# Patient Record
Sex: Male | Born: 1945
Health system: Southern US, Community
[De-identification: ages and names within clinical notes are randomized; demographics above are authoritative.]

## PROBLEM LIST (undated history)

## (undated) DIAGNOSIS — C801 Malignant (primary) neoplasm, unspecified: Secondary | ICD-10-CM

## (undated) DIAGNOSIS — F32A Depression, unspecified: Secondary | ICD-10-CM

## (undated) DIAGNOSIS — M1990 Unspecified osteoarthritis, unspecified site: Secondary | ICD-10-CM

## (undated) DIAGNOSIS — I509 Heart failure, unspecified: Secondary | ICD-10-CM

## (undated) DIAGNOSIS — E785 Hyperlipidemia, unspecified: Secondary | ICD-10-CM

## (undated) DIAGNOSIS — I10 Essential (primary) hypertension: Secondary | ICD-10-CM

## (undated) DIAGNOSIS — E119 Type 2 diabetes mellitus without complications: Secondary | ICD-10-CM

## (undated) DIAGNOSIS — F329 Major depressive disorder, single episode, unspecified: Secondary | ICD-10-CM

## (undated) HISTORY — DX: Unspecified osteoarthritis, unspecified site: M19.90

## (undated) HISTORY — DX: Essential (primary) hypertension: I10

## (undated) HISTORY — PX: APPENDECTOMY: SHX54

## (undated) HISTORY — DX: Malignant (primary) neoplasm, unspecified: C80.1

## (undated) HISTORY — DX: Type 2 diabetes mellitus without complications: E11.9

## (undated) HISTORY — PX: CHOLECYSTECTOMY: SHX55

## (undated) HISTORY — PX: FRACTURE SURGERY: SHX138

## (undated) HISTORY — DX: Hyperlipidemia, unspecified: E78.5

## (undated) HISTORY — DX: Depression, unspecified: F32.A

## (undated) HISTORY — DX: Major depressive disorder, single episode, unspecified: F32.9

## (undated) HISTORY — DX: Heart failure, unspecified: I50.9

---

## 1989-11-06 HISTORY — PX: GASTRIC BYPASS: SHX52

## 2013-11-11 DIAGNOSIS — I509 Heart failure, unspecified: Secondary | ICD-10-CM | POA: Diagnosis not present

## 2013-11-11 DIAGNOSIS — Z7901 Long term (current) use of anticoagulants: Secondary | ICD-10-CM | POA: Diagnosis not present

## 2013-11-11 DIAGNOSIS — I10 Essential (primary) hypertension: Secondary | ICD-10-CM | POA: Diagnosis not present

## 2013-11-11 DIAGNOSIS — E1129 Type 2 diabetes mellitus with other diabetic kidney complication: Secondary | ICD-10-CM | POA: Diagnosis not present

## 2013-11-11 DIAGNOSIS — E785 Hyperlipidemia, unspecified: Secondary | ICD-10-CM | POA: Diagnosis not present

## 2013-11-11 DIAGNOSIS — I4891 Unspecified atrial fibrillation: Secondary | ICD-10-CM | POA: Diagnosis not present

## 2013-11-11 DIAGNOSIS — N183 Chronic kidney disease, stage 3 unspecified: Secondary | ICD-10-CM | POA: Diagnosis not present

## 2013-11-12 DIAGNOSIS — I2699 Other pulmonary embolism without acute cor pulmonale: Secondary | ICD-10-CM | POA: Diagnosis not present

## 2013-11-12 DIAGNOSIS — G4733 Obstructive sleep apnea (adult) (pediatric): Secondary | ICD-10-CM | POA: Diagnosis not present

## 2013-11-12 DIAGNOSIS — I428 Other cardiomyopathies: Secondary | ICD-10-CM | POA: Diagnosis not present

## 2013-11-18 DIAGNOSIS — N189 Chronic kidney disease, unspecified: Secondary | ICD-10-CM | POA: Diagnosis not present

## 2013-11-18 DIAGNOSIS — E1129 Type 2 diabetes mellitus with other diabetic kidney complication: Secondary | ICD-10-CM | POA: Diagnosis not present

## 2013-11-18 DIAGNOSIS — I4891 Unspecified atrial fibrillation: Secondary | ICD-10-CM | POA: Diagnosis not present

## 2013-11-18 DIAGNOSIS — I10 Essential (primary) hypertension: Secondary | ICD-10-CM | POA: Diagnosis not present

## 2013-11-18 DIAGNOSIS — M1710 Unilateral primary osteoarthritis, unspecified knee: Secondary | ICD-10-CM | POA: Diagnosis not present

## 2013-11-18 DIAGNOSIS — Z7901 Long term (current) use of anticoagulants: Secondary | ICD-10-CM | POA: Diagnosis not present

## 2013-11-18 DIAGNOSIS — IMO0002 Reserved for concepts with insufficient information to code with codable children: Secondary | ICD-10-CM | POA: Diagnosis not present

## 2013-11-25 DIAGNOSIS — K029 Dental caries, unspecified: Secondary | ICD-10-CM | POA: Diagnosis not present

## 2013-11-25 DIAGNOSIS — J3489 Other specified disorders of nose and nasal sinuses: Secondary | ICD-10-CM | POA: Diagnosis not present

## 2013-11-25 DIAGNOSIS — M278 Other specified diseases of jaws: Secondary | ICD-10-CM | POA: Diagnosis not present

## 2013-11-25 DIAGNOSIS — K117 Disturbances of salivary secretion: Secondary | ICD-10-CM | POA: Diagnosis not present

## 2013-11-25 DIAGNOSIS — Z8589 Personal history of malignant neoplasm of other organs and systems: Secondary | ICD-10-CM | POA: Diagnosis not present

## 2013-11-26 DIAGNOSIS — R195 Other fecal abnormalities: Secondary | ICD-10-CM | POA: Diagnosis not present

## 2013-11-26 DIAGNOSIS — M129 Arthropathy, unspecified: Secondary | ICD-10-CM | POA: Diagnosis not present

## 2013-11-26 DIAGNOSIS — D126 Benign neoplasm of colon, unspecified: Secondary | ICD-10-CM | POA: Diagnosis not present

## 2013-11-26 DIAGNOSIS — K219 Gastro-esophageal reflux disease without esophagitis: Secondary | ICD-10-CM | POA: Diagnosis not present

## 2013-11-26 DIAGNOSIS — I10 Essential (primary) hypertension: Secondary | ICD-10-CM | POA: Diagnosis not present

## 2013-11-26 DIAGNOSIS — Z8711 Personal history of peptic ulcer disease: Secondary | ICD-10-CM | POA: Diagnosis not present

## 2013-11-26 DIAGNOSIS — K2900 Acute gastritis without bleeding: Secondary | ICD-10-CM | POA: Diagnosis not present

## 2013-11-26 DIAGNOSIS — I509 Heart failure, unspecified: Secondary | ICD-10-CM | POA: Diagnosis not present

## 2013-11-26 DIAGNOSIS — D129 Benign neoplasm of anus and anal canal: Secondary | ICD-10-CM | POA: Diagnosis not present

## 2013-11-26 DIAGNOSIS — Z79899 Other long term (current) drug therapy: Secondary | ICD-10-CM | POA: Diagnosis not present

## 2013-11-26 DIAGNOSIS — K2940 Chronic atrophic gastritis without bleeding: Secondary | ICD-10-CM | POA: Diagnosis not present

## 2013-11-26 DIAGNOSIS — Z86711 Personal history of pulmonary embolism: Secondary | ICD-10-CM | POA: Diagnosis not present

## 2013-11-26 DIAGNOSIS — E119 Type 2 diabetes mellitus without complications: Secondary | ICD-10-CM | POA: Diagnosis not present

## 2013-11-26 DIAGNOSIS — F3289 Other specified depressive episodes: Secondary | ICD-10-CM | POA: Diagnosis not present

## 2013-11-26 DIAGNOSIS — E785 Hyperlipidemia, unspecified: Secondary | ICD-10-CM | POA: Diagnosis not present

## 2013-11-26 DIAGNOSIS — Z7901 Long term (current) use of anticoagulants: Secondary | ICD-10-CM | POA: Diagnosis not present

## 2013-11-26 DIAGNOSIS — F329 Major depressive disorder, single episode, unspecified: Secondary | ICD-10-CM | POA: Diagnosis not present

## 2013-11-26 DIAGNOSIS — D649 Anemia, unspecified: Secondary | ICD-10-CM | POA: Diagnosis not present

## 2013-11-26 DIAGNOSIS — J449 Chronic obstructive pulmonary disease, unspecified: Secondary | ICD-10-CM | POA: Diagnosis not present

## 2013-11-26 DIAGNOSIS — F411 Generalized anxiety disorder: Secondary | ICD-10-CM | POA: Diagnosis not present

## 2013-11-26 DIAGNOSIS — G4733 Obstructive sleep apnea (adult) (pediatric): Secondary | ICD-10-CM | POA: Diagnosis not present

## 2013-11-26 DIAGNOSIS — K254 Chronic or unspecified gastric ulcer with hemorrhage: Secondary | ICD-10-CM | POA: Diagnosis not present

## 2013-11-26 DIAGNOSIS — E079 Disorder of thyroid, unspecified: Secondary | ICD-10-CM | POA: Diagnosis not present

## 2013-11-26 DIAGNOSIS — D128 Benign neoplasm of rectum: Secondary | ICD-10-CM | POA: Diagnosis not present

## 2013-11-26 DIAGNOSIS — Z9981 Dependence on supplemental oxygen: Secondary | ICD-10-CM | POA: Diagnosis not present

## 2013-11-26 DIAGNOSIS — K5730 Diverticulosis of large intestine without perforation or abscess without bleeding: Secondary | ICD-10-CM | POA: Diagnosis not present

## 2013-11-26 DIAGNOSIS — Z86010 Personal history of colonic polyps: Secondary | ICD-10-CM | POA: Diagnosis not present

## 2013-11-27 DIAGNOSIS — D128 Benign neoplasm of rectum: Secondary | ICD-10-CM | POA: Diagnosis not present

## 2013-11-27 DIAGNOSIS — D129 Benign neoplasm of anus and anal canal: Secondary | ICD-10-CM | POA: Diagnosis not present

## 2013-11-27 DIAGNOSIS — K2940 Chronic atrophic gastritis without bleeding: Secondary | ICD-10-CM | POA: Diagnosis not present

## 2013-11-27 DIAGNOSIS — D126 Benign neoplasm of colon, unspecified: Secondary | ICD-10-CM | POA: Diagnosis not present

## 2013-11-27 DIAGNOSIS — K2900 Acute gastritis without bleeding: Secondary | ICD-10-CM | POA: Diagnosis not present

## 2013-11-27 DIAGNOSIS — K5730 Diverticulosis of large intestine without perforation or abscess without bleeding: Secondary | ICD-10-CM | POA: Diagnosis not present

## 2013-11-27 DIAGNOSIS — K254 Chronic or unspecified gastric ulcer with hemorrhage: Secondary | ICD-10-CM | POA: Diagnosis not present

## 2013-11-27 DIAGNOSIS — R195 Other fecal abnormalities: Secondary | ICD-10-CM | POA: Diagnosis not present

## 2013-11-28 DIAGNOSIS — K2940 Chronic atrophic gastritis without bleeding: Secondary | ICD-10-CM | POA: Diagnosis not present

## 2013-11-28 DIAGNOSIS — D129 Benign neoplasm of anus and anal canal: Secondary | ICD-10-CM | POA: Diagnosis not present

## 2013-11-28 DIAGNOSIS — K5730 Diverticulosis of large intestine without perforation or abscess without bleeding: Secondary | ICD-10-CM | POA: Diagnosis not present

## 2013-11-28 DIAGNOSIS — D126 Benign neoplasm of colon, unspecified: Secondary | ICD-10-CM | POA: Diagnosis not present

## 2013-11-28 DIAGNOSIS — I509 Heart failure, unspecified: Secondary | ICD-10-CM | POA: Diagnosis not present

## 2013-11-28 DIAGNOSIS — K2900 Acute gastritis without bleeding: Secondary | ICD-10-CM | POA: Diagnosis not present

## 2013-11-28 DIAGNOSIS — K254 Chronic or unspecified gastric ulcer with hemorrhage: Secondary | ICD-10-CM | POA: Diagnosis not present

## 2013-11-28 DIAGNOSIS — K250 Acute gastric ulcer with hemorrhage: Secondary | ICD-10-CM | POA: Diagnosis not present

## 2013-11-28 DIAGNOSIS — D128 Benign neoplasm of rectum: Secondary | ICD-10-CM | POA: Diagnosis not present

## 2013-12-18 DIAGNOSIS — N058 Unspecified nephritic syndrome with other morphologic changes: Secondary | ICD-10-CM | POA: Diagnosis not present

## 2013-12-18 DIAGNOSIS — I10 Essential (primary) hypertension: Secondary | ICD-10-CM | POA: Diagnosis not present

## 2013-12-18 DIAGNOSIS — J309 Allergic rhinitis, unspecified: Secondary | ICD-10-CM | POA: Diagnosis not present

## 2013-12-18 DIAGNOSIS — Z7901 Long term (current) use of anticoagulants: Secondary | ICD-10-CM | POA: Diagnosis not present

## 2013-12-18 DIAGNOSIS — E1129 Type 2 diabetes mellitus with other diabetic kidney complication: Secondary | ICD-10-CM | POA: Diagnosis not present

## 2013-12-18 DIAGNOSIS — I4891 Unspecified atrial fibrillation: Secondary | ICD-10-CM | POA: Diagnosis not present

## 2013-12-20 DIAGNOSIS — M129 Arthropathy, unspecified: Secondary | ICD-10-CM | POA: Diagnosis not present

## 2013-12-20 DIAGNOSIS — E079 Disorder of thyroid, unspecified: Secondary | ICD-10-CM | POA: Diagnosis not present

## 2013-12-20 DIAGNOSIS — R0602 Shortness of breath: Secondary | ICD-10-CM | POA: Diagnosis not present

## 2013-12-20 DIAGNOSIS — I4510 Unspecified right bundle-branch block: Secondary | ICD-10-CM | POA: Diagnosis not present

## 2013-12-20 DIAGNOSIS — Z87891 Personal history of nicotine dependence: Secondary | ICD-10-CM | POA: Diagnosis not present

## 2013-12-20 DIAGNOSIS — I509 Heart failure, unspecified: Secondary | ICD-10-CM | POA: Diagnosis not present

## 2013-12-20 DIAGNOSIS — R0989 Other specified symptoms and signs involving the circulatory and respiratory systems: Secondary | ICD-10-CM | POA: Diagnosis not present

## 2013-12-20 DIAGNOSIS — Z888 Allergy status to other drugs, medicaments and biological substances status: Secondary | ICD-10-CM | POA: Diagnosis not present

## 2013-12-20 DIAGNOSIS — I10 Essential (primary) hypertension: Secondary | ICD-10-CM | POA: Diagnosis not present

## 2013-12-20 DIAGNOSIS — Z79899 Other long term (current) drug therapy: Secondary | ICD-10-CM | POA: Diagnosis not present

## 2013-12-20 DIAGNOSIS — R112 Nausea with vomiting, unspecified: Secondary | ICD-10-CM | POA: Diagnosis not present

## 2013-12-20 DIAGNOSIS — I517 Cardiomegaly: Secondary | ICD-10-CM | POA: Diagnosis not present

## 2013-12-20 DIAGNOSIS — K219 Gastro-esophageal reflux disease without esophagitis: Secondary | ICD-10-CM | POA: Diagnosis not present

## 2013-12-20 DIAGNOSIS — Z7901 Long term (current) use of anticoagulants: Secondary | ICD-10-CM | POA: Diagnosis not present

## 2013-12-20 DIAGNOSIS — E785 Hyperlipidemia, unspecified: Secondary | ICD-10-CM | POA: Diagnosis not present

## 2013-12-20 DIAGNOSIS — R079 Chest pain, unspecified: Secondary | ICD-10-CM | POA: Diagnosis not present

## 2013-12-20 DIAGNOSIS — F411 Generalized anxiety disorder: Secondary | ICD-10-CM | POA: Diagnosis not present

## 2013-12-20 DIAGNOSIS — Z7982 Long term (current) use of aspirin: Secondary | ICD-10-CM | POA: Diagnosis not present

## 2013-12-20 DIAGNOSIS — R091 Pleurisy: Secondary | ICD-10-CM | POA: Diagnosis not present

## 2013-12-20 DIAGNOSIS — R0609 Other forms of dyspnea: Secondary | ICD-10-CM | POA: Diagnosis not present

## 2014-01-02 DIAGNOSIS — C44519 Basal cell carcinoma of skin of other part of trunk: Secondary | ICD-10-CM | POA: Diagnosis not present

## 2014-01-02 DIAGNOSIS — C44320 Squamous cell carcinoma of skin of unspecified parts of face: Secondary | ICD-10-CM | POA: Diagnosis not present

## 2014-01-02 DIAGNOSIS — Z872 Personal history of diseases of the skin and subcutaneous tissue: Secondary | ICD-10-CM | POA: Diagnosis not present

## 2014-01-02 DIAGNOSIS — D485 Neoplasm of uncertain behavior of skin: Secondary | ICD-10-CM | POA: Diagnosis not present

## 2014-01-05 DIAGNOSIS — C44519 Basal cell carcinoma of skin of other part of trunk: Secondary | ICD-10-CM | POA: Diagnosis not present

## 2014-01-05 DIAGNOSIS — M278 Other specified diseases of jaws: Secondary | ICD-10-CM | POA: Diagnosis not present

## 2014-01-05 DIAGNOSIS — C44320 Squamous cell carcinoma of skin of unspecified parts of face: Secondary | ICD-10-CM | POA: Diagnosis not present

## 2014-01-07 DIAGNOSIS — M278 Other specified diseases of jaws: Secondary | ICD-10-CM | POA: Diagnosis not present

## 2014-01-08 DIAGNOSIS — M278 Other specified diseases of jaws: Secondary | ICD-10-CM | POA: Diagnosis not present

## 2014-01-09 DIAGNOSIS — M278 Other specified diseases of jaws: Secondary | ICD-10-CM | POA: Diagnosis not present

## 2014-01-12 DIAGNOSIS — M278 Other specified diseases of jaws: Secondary | ICD-10-CM | POA: Diagnosis not present

## 2014-01-13 DIAGNOSIS — M278 Other specified diseases of jaws: Secondary | ICD-10-CM | POA: Diagnosis not present

## 2014-01-14 DIAGNOSIS — M278 Other specified diseases of jaws: Secondary | ICD-10-CM | POA: Diagnosis not present

## 2014-01-14 DIAGNOSIS — C44320 Squamous cell carcinoma of skin of unspecified parts of face: Secondary | ICD-10-CM | POA: Diagnosis not present

## 2014-01-14 DIAGNOSIS — C44519 Basal cell carcinoma of skin of other part of trunk: Secondary | ICD-10-CM | POA: Diagnosis not present

## 2014-01-15 DIAGNOSIS — I10 Essential (primary) hypertension: Secondary | ICD-10-CM | POA: Diagnosis not present

## 2014-01-15 DIAGNOSIS — I4891 Unspecified atrial fibrillation: Secondary | ICD-10-CM | POA: Diagnosis not present

## 2014-01-15 DIAGNOSIS — E1129 Type 2 diabetes mellitus with other diabetic kidney complication: Secondary | ICD-10-CM | POA: Diagnosis not present

## 2014-01-15 DIAGNOSIS — N058 Unspecified nephritic syndrome with other morphologic changes: Secondary | ICD-10-CM | POA: Diagnosis not present

## 2014-01-15 DIAGNOSIS — Z7901 Long term (current) use of anticoagulants: Secondary | ICD-10-CM | POA: Diagnosis not present

## 2014-01-15 DIAGNOSIS — M278 Other specified diseases of jaws: Secondary | ICD-10-CM | POA: Diagnosis not present

## 2014-01-16 DIAGNOSIS — M278 Other specified diseases of jaws: Secondary | ICD-10-CM | POA: Diagnosis not present

## 2014-01-19 DIAGNOSIS — M278 Other specified diseases of jaws: Secondary | ICD-10-CM | POA: Diagnosis not present

## 2014-01-20 DIAGNOSIS — I2699 Other pulmonary embolism without acute cor pulmonale: Secondary | ICD-10-CM | POA: Diagnosis not present

## 2014-01-20 DIAGNOSIS — I10 Essential (primary) hypertension: Secondary | ICD-10-CM | POA: Diagnosis not present

## 2014-01-20 DIAGNOSIS — M278 Other specified diseases of jaws: Secondary | ICD-10-CM | POA: Diagnosis not present

## 2014-01-20 DIAGNOSIS — I428 Other cardiomyopathies: Secondary | ICD-10-CM | POA: Diagnosis not present

## 2014-01-21 DIAGNOSIS — M278 Other specified diseases of jaws: Secondary | ICD-10-CM | POA: Diagnosis not present

## 2014-01-22 DIAGNOSIS — M278 Other specified diseases of jaws: Secondary | ICD-10-CM | POA: Diagnosis not present

## 2014-01-23 DIAGNOSIS — M278 Other specified diseases of jaws: Secondary | ICD-10-CM | POA: Diagnosis not present

## 2014-01-26 DIAGNOSIS — M278 Other specified diseases of jaws: Secondary | ICD-10-CM | POA: Diagnosis not present

## 2014-01-27 DIAGNOSIS — M278 Other specified diseases of jaws: Secondary | ICD-10-CM | POA: Diagnosis not present

## 2014-01-28 DIAGNOSIS — M278 Other specified diseases of jaws: Secondary | ICD-10-CM | POA: Diagnosis not present

## 2014-01-29 DIAGNOSIS — M278 Other specified diseases of jaws: Secondary | ICD-10-CM | POA: Diagnosis not present

## 2014-01-30 DIAGNOSIS — M278 Other specified diseases of jaws: Secondary | ICD-10-CM | POA: Diagnosis not present

## 2014-02-03 DIAGNOSIS — M278 Other specified diseases of jaws: Secondary | ICD-10-CM | POA: Diagnosis not present

## 2014-02-04 DIAGNOSIS — E119 Type 2 diabetes mellitus without complications: Secondary | ICD-10-CM | POA: Diagnosis not present

## 2014-02-04 DIAGNOSIS — D539 Nutritional anemia, unspecified: Secondary | ICD-10-CM | POA: Diagnosis not present

## 2014-02-04 DIAGNOSIS — D509 Iron deficiency anemia, unspecified: Secondary | ICD-10-CM | POA: Diagnosis not present

## 2014-02-04 DIAGNOSIS — K252 Acute gastric ulcer with both hemorrhage and perforation: Secondary | ICD-10-CM | POA: Diagnosis not present

## 2014-02-05 DIAGNOSIS — L600 Ingrowing nail: Secondary | ICD-10-CM | POA: Diagnosis not present

## 2014-02-05 DIAGNOSIS — M278 Other specified diseases of jaws: Secondary | ICD-10-CM | POA: Diagnosis not present

## 2014-02-05 DIAGNOSIS — E1159 Type 2 diabetes mellitus with other circulatory complications: Secondary | ICD-10-CM | POA: Diagnosis not present

## 2014-02-07 DIAGNOSIS — I10 Essential (primary) hypertension: Secondary | ICD-10-CM | POA: Diagnosis not present

## 2014-02-07 DIAGNOSIS — M545 Low back pain, unspecified: Secondary | ICD-10-CM | POA: Diagnosis not present

## 2014-02-07 DIAGNOSIS — F411 Generalized anxiety disorder: Secondary | ICD-10-CM | POA: Diagnosis not present

## 2014-02-07 DIAGNOSIS — Z79899 Other long term (current) drug therapy: Secondary | ICD-10-CM | POA: Diagnosis not present

## 2014-02-07 DIAGNOSIS — Z888 Allergy status to other drugs, medicaments and biological substances status: Secondary | ICD-10-CM | POA: Diagnosis not present

## 2014-02-07 DIAGNOSIS — K219 Gastro-esophageal reflux disease without esophagitis: Secondary | ICD-10-CM | POA: Diagnosis not present

## 2014-02-07 DIAGNOSIS — M129 Arthropathy, unspecified: Secondary | ICD-10-CM | POA: Diagnosis not present

## 2014-02-07 DIAGNOSIS — M549 Dorsalgia, unspecified: Secondary | ICD-10-CM | POA: Diagnosis not present

## 2014-02-07 DIAGNOSIS — E785 Hyperlipidemia, unspecified: Secondary | ICD-10-CM | POA: Diagnosis not present

## 2014-02-07 DIAGNOSIS — E079 Disorder of thyroid, unspecified: Secondary | ICD-10-CM | POA: Diagnosis not present

## 2014-02-07 DIAGNOSIS — Z87891 Personal history of nicotine dependence: Secondary | ICD-10-CM | POA: Diagnosis not present

## 2014-02-07 DIAGNOSIS — I509 Heart failure, unspecified: Secondary | ICD-10-CM | POA: Diagnosis not present

## 2014-02-07 DIAGNOSIS — M25559 Pain in unspecified hip: Secondary | ICD-10-CM | POA: Diagnosis not present

## 2014-02-07 DIAGNOSIS — Z7901 Long term (current) use of anticoagulants: Secondary | ICD-10-CM | POA: Diagnosis not present

## 2014-02-07 DIAGNOSIS — Z7982 Long term (current) use of aspirin: Secondary | ICD-10-CM | POA: Diagnosis not present

## 2014-02-07 DIAGNOSIS — M47817 Spondylosis without myelopathy or radiculopathy, lumbosacral region: Secondary | ICD-10-CM | POA: Diagnosis not present

## 2014-02-07 DIAGNOSIS — M47814 Spondylosis without myelopathy or radiculopathy, thoracic region: Secondary | ICD-10-CM | POA: Diagnosis not present

## 2014-02-10 DIAGNOSIS — F331 Major depressive disorder, recurrent, moderate: Secondary | ICD-10-CM | POA: Diagnosis not present

## 2014-02-12 DIAGNOSIS — Z7901 Long term (current) use of anticoagulants: Secondary | ICD-10-CM | POA: Diagnosis not present

## 2014-02-12 DIAGNOSIS — I4891 Unspecified atrial fibrillation: Secondary | ICD-10-CM | POA: Diagnosis not present

## 2014-02-12 DIAGNOSIS — M278 Other specified diseases of jaws: Secondary | ICD-10-CM | POA: Diagnosis not present

## 2014-02-12 DIAGNOSIS — E1129 Type 2 diabetes mellitus with other diabetic kidney complication: Secondary | ICD-10-CM | POA: Diagnosis not present

## 2014-02-12 DIAGNOSIS — N058 Unspecified nephritic syndrome with other morphologic changes: Secondary | ICD-10-CM | POA: Diagnosis not present

## 2014-02-12 DIAGNOSIS — I10 Essential (primary) hypertension: Secondary | ICD-10-CM | POA: Diagnosis not present

## 2014-02-13 DIAGNOSIS — M278 Other specified diseases of jaws: Secondary | ICD-10-CM | POA: Diagnosis not present

## 2014-02-16 DIAGNOSIS — M278 Other specified diseases of jaws: Secondary | ICD-10-CM | POA: Diagnosis not present

## 2014-02-17 DIAGNOSIS — M278 Other specified diseases of jaws: Secondary | ICD-10-CM | POA: Diagnosis not present

## 2014-02-18 DIAGNOSIS — L570 Actinic keratosis: Secondary | ICD-10-CM | POA: Diagnosis not present

## 2014-02-18 DIAGNOSIS — Z85828 Personal history of other malignant neoplasm of skin: Secondary | ICD-10-CM | POA: Diagnosis not present

## 2014-02-18 DIAGNOSIS — M278 Other specified diseases of jaws: Secondary | ICD-10-CM | POA: Diagnosis not present

## 2014-02-19 DIAGNOSIS — M278 Other specified diseases of jaws: Secondary | ICD-10-CM | POA: Diagnosis not present

## 2014-02-19 DIAGNOSIS — I509 Heart failure, unspecified: Secondary | ICD-10-CM | POA: Diagnosis not present

## 2014-02-20 DIAGNOSIS — D539 Nutritional anemia, unspecified: Secondary | ICD-10-CM | POA: Diagnosis not present

## 2014-02-20 DIAGNOSIS — D509 Iron deficiency anemia, unspecified: Secondary | ICD-10-CM | POA: Diagnosis not present

## 2014-02-23 DIAGNOSIS — M278 Other specified diseases of jaws: Secondary | ICD-10-CM | POA: Diagnosis not present

## 2014-02-24 DIAGNOSIS — M278 Other specified diseases of jaws: Secondary | ICD-10-CM | POA: Diagnosis not present

## 2014-02-25 DIAGNOSIS — M278 Other specified diseases of jaws: Secondary | ICD-10-CM | POA: Diagnosis not present

## 2014-03-13 DIAGNOSIS — Z7901 Long term (current) use of anticoagulants: Secondary | ICD-10-CM | POA: Diagnosis not present

## 2014-03-13 DIAGNOSIS — M1710 Unilateral primary osteoarthritis, unspecified knee: Secondary | ICD-10-CM | POA: Diagnosis not present

## 2014-03-13 DIAGNOSIS — IMO0002 Reserved for concepts with insufficient information to code with codable children: Secondary | ICD-10-CM | POA: Diagnosis not present

## 2014-03-13 DIAGNOSIS — I10 Essential (primary) hypertension: Secondary | ICD-10-CM | POA: Diagnosis not present

## 2014-03-13 DIAGNOSIS — I4891 Unspecified atrial fibrillation: Secondary | ICD-10-CM | POA: Diagnosis not present

## 2014-03-13 DIAGNOSIS — E1129 Type 2 diabetes mellitus with other diabetic kidney complication: Secondary | ICD-10-CM | POA: Diagnosis not present

## 2014-03-13 DIAGNOSIS — N058 Unspecified nephritic syndrome with other morphologic changes: Secondary | ICD-10-CM | POA: Diagnosis not present

## 2014-03-17 DIAGNOSIS — I4729 Other ventricular tachycardia: Secondary | ICD-10-CM | POA: Diagnosis not present

## 2014-03-17 DIAGNOSIS — I5042 Chronic combined systolic (congestive) and diastolic (congestive) heart failure: Secondary | ICD-10-CM | POA: Diagnosis not present

## 2014-03-17 DIAGNOSIS — M8708 Idiopathic aseptic necrosis of bone, other site: Secondary | ICD-10-CM | POA: Diagnosis not present

## 2014-03-17 DIAGNOSIS — J384 Edema of larynx: Secondary | ICD-10-CM | POA: Diagnosis not present

## 2014-03-17 DIAGNOSIS — K117 Disturbances of salivary secretion: Secondary | ICD-10-CM | POA: Diagnosis not present

## 2014-03-17 DIAGNOSIS — I509 Heart failure, unspecified: Secondary | ICD-10-CM | POA: Diagnosis not present

## 2014-03-17 DIAGNOSIS — I2789 Other specified pulmonary heart diseases: Secondary | ICD-10-CM | POA: Diagnosis not present

## 2014-03-17 DIAGNOSIS — J342 Deviated nasal septum: Secondary | ICD-10-CM | POA: Diagnosis not present

## 2014-03-17 DIAGNOSIS — I472 Ventricular tachycardia: Secondary | ICD-10-CM | POA: Diagnosis not present

## 2014-03-17 DIAGNOSIS — G4733 Obstructive sleep apnea (adult) (pediatric): Secondary | ICD-10-CM | POA: Diagnosis not present

## 2014-04-01 DIAGNOSIS — M1710 Unilateral primary osteoarthritis, unspecified knee: Secondary | ICD-10-CM | POA: Diagnosis not present

## 2014-04-13 DIAGNOSIS — M1990 Unspecified osteoarthritis, unspecified site: Secondary | ICD-10-CM | POA: Diagnosis not present

## 2014-04-13 DIAGNOSIS — E1129 Type 2 diabetes mellitus with other diabetic kidney complication: Secondary | ICD-10-CM | POA: Diagnosis not present

## 2014-04-13 DIAGNOSIS — I509 Heart failure, unspecified: Secondary | ICD-10-CM | POA: Diagnosis not present

## 2014-04-13 DIAGNOSIS — I10 Essential (primary) hypertension: Secondary | ICD-10-CM | POA: Diagnosis not present

## 2014-04-13 DIAGNOSIS — Z7901 Long term (current) use of anticoagulants: Secondary | ICD-10-CM | POA: Diagnosis not present

## 2014-04-13 DIAGNOSIS — I4891 Unspecified atrial fibrillation: Secondary | ICD-10-CM | POA: Diagnosis not present

## 2014-04-13 DIAGNOSIS — N058 Unspecified nephritic syndrome with other morphologic changes: Secondary | ICD-10-CM | POA: Diagnosis not present

## 2014-04-16 DIAGNOSIS — F411 Generalized anxiety disorder: Secondary | ICD-10-CM | POA: Diagnosis present

## 2014-04-16 DIAGNOSIS — I5031 Acute diastolic (congestive) heart failure: Secondary | ICD-10-CM | POA: Diagnosis not present

## 2014-04-16 DIAGNOSIS — I2789 Other specified pulmonary heart diseases: Secondary | ICD-10-CM | POA: Diagnosis not present

## 2014-04-16 DIAGNOSIS — R0989 Other specified symptoms and signs involving the circulatory and respiratory systems: Secondary | ICD-10-CM | POA: Diagnosis not present

## 2014-04-16 DIAGNOSIS — I509 Heart failure, unspecified: Secondary | ICD-10-CM | POA: Diagnosis not present

## 2014-04-16 DIAGNOSIS — E785 Hyperlipidemia, unspecified: Secondary | ICD-10-CM | POA: Diagnosis present

## 2014-04-16 DIAGNOSIS — Z86711 Personal history of pulmonary embolism: Secondary | ICD-10-CM | POA: Diagnosis not present

## 2014-04-16 DIAGNOSIS — K219 Gastro-esophageal reflux disease without esophagitis: Secondary | ICD-10-CM | POA: Diagnosis present

## 2014-04-16 DIAGNOSIS — I5043 Acute on chronic combined systolic (congestive) and diastolic (congestive) heart failure: Secondary | ICD-10-CM | POA: Diagnosis not present

## 2014-04-16 DIAGNOSIS — E039 Hypothyroidism, unspecified: Secondary | ICD-10-CM | POA: Diagnosis present

## 2014-04-16 DIAGNOSIS — J9620 Acute and chronic respiratory failure, unspecified whether with hypoxia or hypercapnia: Secondary | ICD-10-CM | POA: Diagnosis not present

## 2014-04-16 DIAGNOSIS — Z9884 Bariatric surgery status: Secondary | ICD-10-CM | POA: Diagnosis not present

## 2014-04-16 DIAGNOSIS — E876 Hypokalemia: Secondary | ICD-10-CM | POA: Diagnosis not present

## 2014-04-16 DIAGNOSIS — Z886 Allergy status to analgesic agent status: Secondary | ICD-10-CM | POA: Diagnosis not present

## 2014-04-16 DIAGNOSIS — I2699 Other pulmonary embolism without acute cor pulmonale: Secondary | ICD-10-CM | POA: Diagnosis not present

## 2014-04-16 DIAGNOSIS — I4510 Unspecified right bundle-branch block: Secondary | ICD-10-CM | POA: Diagnosis not present

## 2014-04-16 DIAGNOSIS — R0609 Other forms of dyspnea: Secondary | ICD-10-CM | POA: Diagnosis not present

## 2014-04-16 DIAGNOSIS — Z923 Personal history of irradiation: Secondary | ICD-10-CM | POA: Diagnosis not present

## 2014-04-16 DIAGNOSIS — R091 Pleurisy: Secondary | ICD-10-CM | POA: Diagnosis not present

## 2014-04-16 DIAGNOSIS — Z6841 Body Mass Index (BMI) 40.0 and over, adult: Secondary | ICD-10-CM | POA: Diagnosis not present

## 2014-04-16 DIAGNOSIS — J309 Allergic rhinitis, unspecified: Secondary | ICD-10-CM | POA: Diagnosis present

## 2014-04-16 DIAGNOSIS — Z87898 Personal history of other specified conditions: Secondary | ICD-10-CM | POA: Diagnosis not present

## 2014-04-16 DIAGNOSIS — J9801 Acute bronchospasm: Secondary | ICD-10-CM | POA: Diagnosis present

## 2014-04-16 DIAGNOSIS — M129 Arthropathy, unspecified: Secondary | ICD-10-CM | POA: Diagnosis present

## 2014-04-16 DIAGNOSIS — Z9981 Dependence on supplemental oxygen: Secondary | ICD-10-CM | POA: Diagnosis not present

## 2014-04-16 DIAGNOSIS — R791 Abnormal coagulation profile: Secondary | ICD-10-CM | POA: Diagnosis not present

## 2014-04-16 DIAGNOSIS — I10 Essential (primary) hypertension: Secondary | ICD-10-CM | POA: Diagnosis not present

## 2014-04-16 DIAGNOSIS — R0602 Shortness of breath: Secondary | ICD-10-CM | POA: Diagnosis not present

## 2014-04-16 DIAGNOSIS — Z85828 Personal history of other malignant neoplasm of skin: Secondary | ICD-10-CM | POA: Diagnosis not present

## 2014-04-16 DIAGNOSIS — I5041 Acute combined systolic (congestive) and diastolic (congestive) heart failure: Secondary | ICD-10-CM | POA: Diagnosis not present

## 2014-04-16 DIAGNOSIS — I5030 Unspecified diastolic (congestive) heart failure: Secondary | ICD-10-CM | POA: Diagnosis not present

## 2014-04-16 DIAGNOSIS — G4733 Obstructive sleep apnea (adult) (pediatric): Secondary | ICD-10-CM | POA: Diagnosis present

## 2014-04-16 DIAGNOSIS — Z888 Allergy status to other drugs, medicaments and biological substances status: Secondary | ICD-10-CM | POA: Diagnosis not present

## 2014-04-23 DIAGNOSIS — L570 Actinic keratosis: Secondary | ICD-10-CM | POA: Diagnosis not present

## 2014-04-28 DIAGNOSIS — I4891 Unspecified atrial fibrillation: Secondary | ICD-10-CM | POA: Diagnosis not present

## 2014-04-28 DIAGNOSIS — Z7901 Long term (current) use of anticoagulants: Secondary | ICD-10-CM | POA: Diagnosis not present

## 2014-04-28 DIAGNOSIS — E785 Hyperlipidemia, unspecified: Secondary | ICD-10-CM | POA: Diagnosis not present

## 2014-04-28 DIAGNOSIS — I10 Essential (primary) hypertension: Secondary | ICD-10-CM | POA: Diagnosis not present

## 2014-04-28 DIAGNOSIS — N058 Unspecified nephritic syndrome with other morphologic changes: Secondary | ICD-10-CM | POA: Diagnosis not present

## 2014-04-28 DIAGNOSIS — E1129 Type 2 diabetes mellitus with other diabetic kidney complication: Secondary | ICD-10-CM | POA: Diagnosis not present

## 2014-04-28 DIAGNOSIS — R0609 Other forms of dyspnea: Secondary | ICD-10-CM | POA: Diagnosis not present

## 2014-04-28 DIAGNOSIS — I509 Heart failure, unspecified: Secondary | ICD-10-CM | POA: Diagnosis not present

## 2014-04-28 DIAGNOSIS — E039 Hypothyroidism, unspecified: Secondary | ICD-10-CM | POA: Diagnosis not present

## 2014-04-29 DIAGNOSIS — E039 Hypothyroidism, unspecified: Secondary | ICD-10-CM | POA: Diagnosis not present

## 2014-05-11 DIAGNOSIS — L608 Other nail disorders: Secondary | ICD-10-CM | POA: Diagnosis not present

## 2014-05-11 DIAGNOSIS — E1159 Type 2 diabetes mellitus with other circulatory complications: Secondary | ICD-10-CM | POA: Diagnosis not present

## 2014-05-27 DIAGNOSIS — E1129 Type 2 diabetes mellitus with other diabetic kidney complication: Secondary | ICD-10-CM | POA: Diagnosis not present

## 2014-05-27 DIAGNOSIS — N182 Chronic kidney disease, stage 2 (mild): Secondary | ICD-10-CM | POA: Diagnosis not present

## 2014-05-27 DIAGNOSIS — R609 Edema, unspecified: Secondary | ICD-10-CM | POA: Diagnosis not present

## 2014-05-27 DIAGNOSIS — Z7901 Long term (current) use of anticoagulants: Secondary | ICD-10-CM | POA: Diagnosis not present

## 2014-05-27 DIAGNOSIS — I10 Essential (primary) hypertension: Secondary | ICD-10-CM | POA: Diagnosis not present

## 2014-05-27 DIAGNOSIS — R0609 Other forms of dyspnea: Secondary | ICD-10-CM | POA: Diagnosis not present

## 2014-05-27 DIAGNOSIS — I4891 Unspecified atrial fibrillation: Secondary | ICD-10-CM | POA: Diagnosis not present

## 2014-06-15 DIAGNOSIS — L570 Actinic keratosis: Secondary | ICD-10-CM | POA: Diagnosis not present

## 2014-07-08 DIAGNOSIS — E669 Obesity, unspecified: Secondary | ICD-10-CM | POA: Diagnosis not present

## 2014-07-08 DIAGNOSIS — M25569 Pain in unspecified knee: Secondary | ICD-10-CM | POA: Diagnosis not present

## 2014-07-08 DIAGNOSIS — M1710 Unilateral primary osteoarthritis, unspecified knee: Secondary | ICD-10-CM | POA: Diagnosis not present

## 2014-07-08 DIAGNOSIS — G8929 Other chronic pain: Secondary | ICD-10-CM | POA: Diagnosis not present

## 2014-07-24 DIAGNOSIS — I10 Essential (primary) hypertension: Secondary | ICD-10-CM | POA: Diagnosis not present

## 2014-07-24 DIAGNOSIS — I2789 Other specified pulmonary heart diseases: Secondary | ICD-10-CM | POA: Diagnosis not present

## 2014-07-24 DIAGNOSIS — I428 Other cardiomyopathies: Secondary | ICD-10-CM | POA: Diagnosis not present

## 2014-08-07 DIAGNOSIS — Z125 Encounter for screening for malignant neoplasm of prostate: Secondary | ICD-10-CM | POA: Diagnosis not present

## 2014-08-07 DIAGNOSIS — J302 Other seasonal allergic rhinitis: Secondary | ICD-10-CM | POA: Diagnosis not present

## 2014-08-07 DIAGNOSIS — Z0000 Encounter for general adult medical examination without abnormal findings: Secondary | ICD-10-CM | POA: Diagnosis not present

## 2014-08-07 DIAGNOSIS — E785 Hyperlipidemia, unspecified: Secondary | ICD-10-CM | POA: Diagnosis not present

## 2014-08-07 DIAGNOSIS — Z7901 Long term (current) use of anticoagulants: Secondary | ICD-10-CM | POA: Diagnosis not present

## 2014-08-07 DIAGNOSIS — E119 Type 2 diabetes mellitus without complications: Secondary | ICD-10-CM | POA: Diagnosis not present

## 2014-08-10 DIAGNOSIS — Z87891 Personal history of nicotine dependence: Secondary | ICD-10-CM | POA: Diagnosis not present

## 2014-08-10 DIAGNOSIS — E785 Hyperlipidemia, unspecified: Secondary | ICD-10-CM | POA: Diagnosis present

## 2014-08-10 DIAGNOSIS — I10 Essential (primary) hypertension: Secondary | ICD-10-CM | POA: Diagnosis not present

## 2014-08-10 DIAGNOSIS — J90 Pleural effusion, not elsewhere classified: Secondary | ICD-10-CM | POA: Diagnosis not present

## 2014-08-10 DIAGNOSIS — Z8589 Personal history of malignant neoplasm of other organs and systems: Secondary | ICD-10-CM | POA: Diagnosis not present

## 2014-08-10 DIAGNOSIS — I071 Rheumatic tricuspid insufficiency: Secondary | ICD-10-CM | POA: Diagnosis present

## 2014-08-10 DIAGNOSIS — Z79899 Other long term (current) drug therapy: Secondary | ICD-10-CM | POA: Diagnosis not present

## 2014-08-10 DIAGNOSIS — I2781 Cor pulmonale (chronic): Secondary | ICD-10-CM | POA: Diagnosis present

## 2014-08-10 DIAGNOSIS — Z886 Allergy status to analgesic agent status: Secondary | ICD-10-CM | POA: Diagnosis not present

## 2014-08-10 DIAGNOSIS — Z7901 Long term (current) use of anticoagulants: Secondary | ICD-10-CM | POA: Diagnosis not present

## 2014-08-10 DIAGNOSIS — G4733 Obstructive sleep apnea (adult) (pediatric): Secondary | ICD-10-CM | POA: Diagnosis present

## 2014-08-10 DIAGNOSIS — R0602 Shortness of breath: Secondary | ICD-10-CM | POA: Diagnosis not present

## 2014-08-10 DIAGNOSIS — Z86711 Personal history of pulmonary embolism: Secondary | ICD-10-CM | POA: Diagnosis not present

## 2014-08-10 DIAGNOSIS — E8779 Other fluid overload: Secondary | ICD-10-CM | POA: Diagnosis not present

## 2014-08-10 DIAGNOSIS — Z9981 Dependence on supplemental oxygen: Secondary | ICD-10-CM | POA: Diagnosis not present

## 2014-08-10 DIAGNOSIS — I429 Cardiomyopathy, unspecified: Secondary | ICD-10-CM | POA: Diagnosis not present

## 2014-08-10 DIAGNOSIS — Z888 Allergy status to other drugs, medicaments and biological substances status: Secondary | ICD-10-CM | POA: Diagnosis not present

## 2014-08-10 DIAGNOSIS — R0600 Dyspnea, unspecified: Secondary | ICD-10-CM | POA: Diagnosis not present

## 2014-08-10 DIAGNOSIS — E875 Hyperkalemia: Secondary | ICD-10-CM | POA: Diagnosis present

## 2014-08-10 DIAGNOSIS — I272 Other secondary pulmonary hypertension: Secondary | ICD-10-CM | POA: Diagnosis present

## 2014-08-10 DIAGNOSIS — F419 Anxiety disorder, unspecified: Secondary | ICD-10-CM | POA: Diagnosis present

## 2014-08-10 DIAGNOSIS — I5043 Acute on chronic combined systolic (congestive) and diastolic (congestive) heart failure: Secondary | ICD-10-CM | POA: Diagnosis not present

## 2014-08-10 DIAGNOSIS — Z8572 Personal history of non-Hodgkin lymphomas: Secondary | ICD-10-CM | POA: Diagnosis not present

## 2014-08-10 DIAGNOSIS — E039 Hypothyroidism, unspecified: Secondary | ICD-10-CM | POA: Diagnosis not present

## 2014-08-10 DIAGNOSIS — K219 Gastro-esophageal reflux disease without esophagitis: Secondary | ICD-10-CM | POA: Diagnosis present

## 2014-08-10 DIAGNOSIS — Z9989 Dependence on other enabling machines and devices: Secondary | ICD-10-CM | POA: Diagnosis not present

## 2014-08-10 DIAGNOSIS — J9621 Acute and chronic respiratory failure with hypoxia: Secondary | ICD-10-CM | POA: Diagnosis not present

## 2014-08-10 DIAGNOSIS — I4891 Unspecified atrial fibrillation: Secondary | ICD-10-CM | POA: Diagnosis not present

## 2014-08-10 DIAGNOSIS — J449 Chronic obstructive pulmonary disease, unspecified: Secondary | ICD-10-CM | POA: Diagnosis present

## 2014-08-10 DIAGNOSIS — Z6841 Body Mass Index (BMI) 40.0 and over, adult: Secondary | ICD-10-CM | POA: Diagnosis not present

## 2014-08-10 DIAGNOSIS — E8770 Fluid overload, unspecified: Secondary | ICD-10-CM | POA: Diagnosis not present

## 2014-08-10 DIAGNOSIS — R0902 Hypoxemia: Secondary | ICD-10-CM | POA: Diagnosis not present

## 2014-08-14 DIAGNOSIS — R0600 Dyspnea, unspecified: Secondary | ICD-10-CM | POA: Diagnosis not present

## 2014-08-14 DIAGNOSIS — I509 Heart failure, unspecified: Secondary | ICD-10-CM | POA: Diagnosis not present

## 2014-08-14 DIAGNOSIS — I130 Hypertensive heart and chronic kidney disease with heart failure and stage 1 through stage 4 chronic kidney disease, or unspecified chronic kidney disease: Secondary | ICD-10-CM | POA: Diagnosis not present

## 2014-08-14 DIAGNOSIS — I4891 Unspecified atrial fibrillation: Secondary | ICD-10-CM | POA: Diagnosis not present

## 2014-08-14 DIAGNOSIS — Z7901 Long term (current) use of anticoagulants: Secondary | ICD-10-CM | POA: Diagnosis not present

## 2014-08-14 DIAGNOSIS — E1122 Type 2 diabetes mellitus with diabetic chronic kidney disease: Secondary | ICD-10-CM | POA: Diagnosis not present

## 2014-08-14 DIAGNOSIS — E119 Type 2 diabetes mellitus without complications: Secondary | ICD-10-CM | POA: Diagnosis not present

## 2014-08-14 DIAGNOSIS — E669 Obesity, unspecified: Secondary | ICD-10-CM | POA: Diagnosis not present

## 2014-08-14 DIAGNOSIS — E875 Hyperkalemia: Secondary | ICD-10-CM | POA: Diagnosis not present

## 2014-08-19 DIAGNOSIS — Z7901 Long term (current) use of anticoagulants: Secondary | ICD-10-CM | POA: Diagnosis not present

## 2014-08-20 DIAGNOSIS — J3089 Other allergic rhinitis: Secondary | ICD-10-CM | POA: Diagnosis not present

## 2014-08-20 DIAGNOSIS — J310 Chronic rhinitis: Secondary | ICD-10-CM | POA: Diagnosis not present

## 2014-08-20 DIAGNOSIS — J300 Vasomotor rhinitis: Secondary | ICD-10-CM | POA: Diagnosis not present

## 2014-08-20 DIAGNOSIS — J4520 Mild intermittent asthma, uncomplicated: Secondary | ICD-10-CM | POA: Diagnosis not present

## 2014-08-25 DIAGNOSIS — Z7901 Long term (current) use of anticoagulants: Secondary | ICD-10-CM | POA: Diagnosis not present

## 2014-08-25 DIAGNOSIS — I10 Essential (primary) hypertension: Secondary | ICD-10-CM | POA: Diagnosis not present

## 2014-09-01 DIAGNOSIS — I10 Essential (primary) hypertension: Secondary | ICD-10-CM | POA: Diagnosis present

## 2014-09-01 DIAGNOSIS — T8351XA Infection and inflammatory reaction due to indwelling urinary catheter, initial encounter: Secondary | ICD-10-CM | POA: Diagnosis not present

## 2014-09-01 DIAGNOSIS — E785 Hyperlipidemia, unspecified: Secondary | ICD-10-CM | POA: Diagnosis present

## 2014-09-01 DIAGNOSIS — R791 Abnormal coagulation profile: Secondary | ICD-10-CM | POA: Diagnosis present

## 2014-09-01 DIAGNOSIS — G4733 Obstructive sleep apnea (adult) (pediatric): Secondary | ICD-10-CM | POA: Diagnosis present

## 2014-09-01 DIAGNOSIS — T45515A Adverse effect of anticoagulants, initial encounter: Secondary | ICD-10-CM | POA: Diagnosis present

## 2014-09-01 DIAGNOSIS — A419 Sepsis, unspecified organism: Secondary | ICD-10-CM | POA: Diagnosis not present

## 2014-09-01 DIAGNOSIS — Z8589 Personal history of malignant neoplasm of other organs and systems: Secondary | ICD-10-CM | POA: Diagnosis not present

## 2014-09-01 DIAGNOSIS — I509 Heart failure, unspecified: Secondary | ICD-10-CM | POA: Diagnosis not present

## 2014-09-01 DIAGNOSIS — R531 Weakness: Secondary | ICD-10-CM | POA: Diagnosis not present

## 2014-09-01 DIAGNOSIS — Z6841 Body Mass Index (BMI) 40.0 and over, adult: Secondary | ICD-10-CM | POA: Diagnosis not present

## 2014-09-01 DIAGNOSIS — R404 Transient alteration of awareness: Secondary | ICD-10-CM | POA: Diagnosis not present

## 2014-09-01 DIAGNOSIS — Z87891 Personal history of nicotine dependence: Secondary | ICD-10-CM | POA: Diagnosis not present

## 2014-09-01 DIAGNOSIS — N179 Acute kidney failure, unspecified: Secondary | ICD-10-CM | POA: Diagnosis not present

## 2014-09-01 DIAGNOSIS — N390 Urinary tract infection, site not specified: Secondary | ICD-10-CM | POA: Diagnosis not present

## 2014-09-01 DIAGNOSIS — Z9981 Dependence on supplemental oxygen: Secondary | ICD-10-CM | POA: Diagnosis not present

## 2014-09-01 DIAGNOSIS — Z86711 Personal history of pulmonary embolism: Secondary | ICD-10-CM | POA: Diagnosis not present

## 2014-09-01 DIAGNOSIS — R918 Other nonspecific abnormal finding of lung field: Secondary | ICD-10-CM | POA: Diagnosis not present

## 2014-09-01 DIAGNOSIS — I429 Cardiomyopathy, unspecified: Secondary | ICD-10-CM | POA: Diagnosis present

## 2014-09-01 DIAGNOSIS — J90 Pleural effusion, not elsewhere classified: Secondary | ICD-10-CM | POA: Diagnosis not present

## 2014-09-01 DIAGNOSIS — I272 Other secondary pulmonary hypertension: Secondary | ICD-10-CM | POA: Diagnosis present

## 2014-09-01 DIAGNOSIS — T604X1A Toxic effect of rodenticides, accidental (unintentional), initial encounter: Secondary | ICD-10-CM | POA: Diagnosis not present

## 2014-09-01 DIAGNOSIS — Z888 Allergy status to other drugs, medicaments and biological substances status: Secondary | ICD-10-CM | POA: Diagnosis not present

## 2014-09-01 DIAGNOSIS — M1990 Unspecified osteoarthritis, unspecified site: Secondary | ICD-10-CM | POA: Diagnosis present

## 2014-09-01 DIAGNOSIS — F419 Anxiety disorder, unspecified: Secondary | ICD-10-CM | POA: Diagnosis present

## 2014-09-01 DIAGNOSIS — R05 Cough: Secondary | ICD-10-CM | POA: Diagnosis not present

## 2014-09-01 DIAGNOSIS — Z9884 Bariatric surgery status: Secondary | ICD-10-CM | POA: Diagnosis not present

## 2014-09-01 DIAGNOSIS — K5730 Diverticulosis of large intestine without perforation or abscess without bleeding: Secondary | ICD-10-CM | POA: Diagnosis not present

## 2014-09-01 DIAGNOSIS — E079 Disorder of thyroid, unspecified: Secondary | ICD-10-CM | POA: Diagnosis present

## 2014-09-01 DIAGNOSIS — K219 Gastro-esophageal reflux disease without esophagitis: Secondary | ICD-10-CM | POA: Diagnosis present

## 2014-09-01 DIAGNOSIS — B9620 Unspecified Escherichia coli [E. coli] as the cause of diseases classified elsewhere: Secondary | ICD-10-CM | POA: Diagnosis present

## 2014-09-09 DIAGNOSIS — I10 Essential (primary) hypertension: Secondary | ICD-10-CM | POA: Diagnosis not present

## 2014-09-10 DIAGNOSIS — L603 Nail dystrophy: Secondary | ICD-10-CM | POA: Diagnosis not present

## 2014-09-10 DIAGNOSIS — E1151 Type 2 diabetes mellitus with diabetic peripheral angiopathy without gangrene: Secondary | ICD-10-CM | POA: Diagnosis not present

## 2014-09-16 DIAGNOSIS — Z7901 Long term (current) use of anticoagulants: Secondary | ICD-10-CM | POA: Diagnosis not present

## 2014-09-16 DIAGNOSIS — E119 Type 2 diabetes mellitus without complications: Secondary | ICD-10-CM | POA: Diagnosis not present

## 2014-09-16 DIAGNOSIS — I4891 Unspecified atrial fibrillation: Secondary | ICD-10-CM | POA: Diagnosis not present

## 2014-09-21 DIAGNOSIS — J370 Chronic laryngitis: Secondary | ICD-10-CM | POA: Diagnosis not present

## 2014-09-21 DIAGNOSIS — K117 Disturbances of salivary secretion: Secondary | ICD-10-CM | POA: Diagnosis not present

## 2014-09-21 DIAGNOSIS — Z87891 Personal history of nicotine dependence: Secondary | ICD-10-CM | POA: Diagnosis not present

## 2014-09-21 DIAGNOSIS — J384 Edema of larynx: Secondary | ICD-10-CM | POA: Diagnosis not present

## 2014-09-23 DIAGNOSIS — I509 Heart failure, unspecified: Secondary | ICD-10-CM | POA: Diagnosis not present

## 2014-09-23 DIAGNOSIS — Z7901 Long term (current) use of anticoagulants: Secondary | ICD-10-CM | POA: Diagnosis not present

## 2014-09-23 DIAGNOSIS — I4891 Unspecified atrial fibrillation: Secondary | ICD-10-CM | POA: Diagnosis not present

## 2014-09-23 DIAGNOSIS — I10 Essential (primary) hypertension: Secondary | ICD-10-CM | POA: Diagnosis not present

## 2014-09-29 DIAGNOSIS — I4891 Unspecified atrial fibrillation: Secondary | ICD-10-CM | POA: Diagnosis not present

## 2014-09-29 DIAGNOSIS — E119 Type 2 diabetes mellitus without complications: Secondary | ICD-10-CM | POA: Diagnosis not present

## 2014-09-29 DIAGNOSIS — Z7901 Long term (current) use of anticoagulants: Secondary | ICD-10-CM | POA: Diagnosis not present

## 2014-09-29 DIAGNOSIS — I10 Essential (primary) hypertension: Secondary | ICD-10-CM | POA: Diagnosis not present

## 2014-10-03 DIAGNOSIS — I4891 Unspecified atrial fibrillation: Secondary | ICD-10-CM | POA: Diagnosis not present

## 2014-10-03 DIAGNOSIS — E119 Type 2 diabetes mellitus without complications: Secondary | ICD-10-CM | POA: Diagnosis not present

## 2014-10-03 DIAGNOSIS — Z7901 Long term (current) use of anticoagulants: Secondary | ICD-10-CM | POA: Diagnosis not present

## 2014-10-03 DIAGNOSIS — A419 Sepsis, unspecified organism: Secondary | ICD-10-CM | POA: Diagnosis not present

## 2014-10-05 DIAGNOSIS — H903 Sensorineural hearing loss, bilateral: Secondary | ICD-10-CM | POA: Diagnosis not present

## 2014-10-13 DIAGNOSIS — M25562 Pain in left knee: Secondary | ICD-10-CM | POA: Diagnosis not present

## 2014-10-13 DIAGNOSIS — M25561 Pain in right knee: Secondary | ICD-10-CM | POA: Diagnosis not present

## 2014-10-20 DIAGNOSIS — R55 Syncope and collapse: Secondary | ICD-10-CM | POA: Diagnosis not present

## 2014-10-20 DIAGNOSIS — I472 Ventricular tachycardia: Secondary | ICD-10-CM | POA: Diagnosis not present

## 2014-10-20 DIAGNOSIS — I5042 Chronic combined systolic (congestive) and diastolic (congestive) heart failure: Secondary | ICD-10-CM | POA: Diagnosis not present

## 2014-10-20 DIAGNOSIS — I272 Other secondary pulmonary hypertension: Secondary | ICD-10-CM | POA: Diagnosis not present

## 2014-10-27 DIAGNOSIS — E785 Hyperlipidemia, unspecified: Secondary | ICD-10-CM | POA: Diagnosis not present

## 2014-10-28 DIAGNOSIS — E669 Obesity, unspecified: Secondary | ICD-10-CM | POA: Diagnosis not present

## 2014-10-28 DIAGNOSIS — E119 Type 2 diabetes mellitus without complications: Secondary | ICD-10-CM | POA: Diagnosis not present

## 2014-11-03 DIAGNOSIS — K117 Disturbances of salivary secretion: Secondary | ICD-10-CM | POA: Diagnosis not present

## 2014-11-03 DIAGNOSIS — S00411A Abrasion of right ear, initial encounter: Secondary | ICD-10-CM | POA: Diagnosis not present

## 2014-11-03 DIAGNOSIS — H6121 Impacted cerumen, right ear: Secondary | ICD-10-CM | POA: Diagnosis not present

## 2014-11-03 DIAGNOSIS — H903 Sensorineural hearing loss, bilateral: Secondary | ICD-10-CM | POA: Diagnosis not present

## 2014-11-12 DIAGNOSIS — L570 Actinic keratosis: Secondary | ICD-10-CM | POA: Diagnosis not present

## 2014-11-25 DIAGNOSIS — E780 Pure hypercholesterolemia: Secondary | ICD-10-CM | POA: Diagnosis not present

## 2014-11-25 DIAGNOSIS — Z7901 Long term (current) use of anticoagulants: Secondary | ICD-10-CM | POA: Diagnosis not present

## 2014-11-25 DIAGNOSIS — E038 Other specified hypothyroidism: Secondary | ICD-10-CM | POA: Diagnosis not present

## 2014-11-25 DIAGNOSIS — M545 Low back pain: Secondary | ICD-10-CM | POA: Diagnosis not present

## 2014-11-25 DIAGNOSIS — I2699 Other pulmonary embolism without acute cor pulmonale: Secondary | ICD-10-CM | POA: Diagnosis not present

## 2014-11-25 DIAGNOSIS — I10 Essential (primary) hypertension: Secondary | ICD-10-CM | POA: Diagnosis not present

## 2014-12-02 DIAGNOSIS — F331 Major depressive disorder, recurrent, moderate: Secondary | ICD-10-CM | POA: Diagnosis not present

## 2014-12-24 DIAGNOSIS — K219 Gastro-esophageal reflux disease without esophagitis: Secondary | ICD-10-CM | POA: Diagnosis not present

## 2014-12-24 DIAGNOSIS — E119 Type 2 diabetes mellitus without complications: Secondary | ICD-10-CM | POA: Diagnosis not present

## 2014-12-24 DIAGNOSIS — D649 Anemia, unspecified: Secondary | ICD-10-CM | POA: Diagnosis not present

## 2014-12-24 DIAGNOSIS — D539 Nutritional anemia, unspecified: Secondary | ICD-10-CM | POA: Diagnosis not present

## 2014-12-24 DIAGNOSIS — E1151 Type 2 diabetes mellitus with diabetic peripheral angiopathy without gangrene: Secondary | ICD-10-CM | POA: Diagnosis not present

## 2014-12-24 DIAGNOSIS — L603 Nail dystrophy: Secondary | ICD-10-CM | POA: Diagnosis not present

## 2014-12-30 DIAGNOSIS — G894 Chronic pain syndrome: Secondary | ICD-10-CM | POA: Diagnosis not present

## 2014-12-30 DIAGNOSIS — Z79891 Long term (current) use of opiate analgesic: Secondary | ICD-10-CM | POA: Diagnosis not present

## 2014-12-30 DIAGNOSIS — Z7901 Long term (current) use of anticoagulants: Secondary | ICD-10-CM | POA: Diagnosis not present

## 2014-12-30 DIAGNOSIS — M5136 Other intervertebral disc degeneration, lumbar region: Secondary | ICD-10-CM | POA: Diagnosis not present

## 2014-12-30 DIAGNOSIS — I2699 Other pulmonary embolism without acute cor pulmonale: Secondary | ICD-10-CM | POA: Diagnosis not present

## 2014-12-30 DIAGNOSIS — M461 Sacroiliitis, not elsewhere classified: Secondary | ICD-10-CM | POA: Diagnosis not present

## 2014-12-30 DIAGNOSIS — M47896 Other spondylosis, lumbar region: Secondary | ICD-10-CM | POA: Diagnosis not present

## 2014-12-31 DIAGNOSIS — Z872 Personal history of diseases of the skin and subcutaneous tissue: Secondary | ICD-10-CM | POA: Diagnosis not present

## 2014-12-31 DIAGNOSIS — Z08 Encounter for follow-up examination after completed treatment for malignant neoplasm: Secondary | ICD-10-CM | POA: Diagnosis not present

## 2014-12-31 DIAGNOSIS — Z85828 Personal history of other malignant neoplasm of skin: Secondary | ICD-10-CM | POA: Diagnosis not present

## 2014-12-31 DIAGNOSIS — L570 Actinic keratosis: Secondary | ICD-10-CM | POA: Diagnosis not present

## 2015-01-07 DIAGNOSIS — I5042 Chronic combined systolic (congestive) and diastolic (congestive) heart failure: Secondary | ICD-10-CM | POA: Diagnosis not present

## 2015-01-07 DIAGNOSIS — I471 Supraventricular tachycardia: Secondary | ICD-10-CM | POA: Diagnosis not present

## 2015-01-07 DIAGNOSIS — R55 Syncope and collapse: Secondary | ICD-10-CM | POA: Diagnosis not present

## 2015-01-07 DIAGNOSIS — E780 Pure hypercholesterolemia: Secondary | ICD-10-CM | POA: Diagnosis not present

## 2015-01-07 DIAGNOSIS — I10 Essential (primary) hypertension: Secondary | ICD-10-CM | POA: Diagnosis not present

## 2015-01-07 DIAGNOSIS — E785 Hyperlipidemia, unspecified: Secondary | ICD-10-CM | POA: Diagnosis not present

## 2015-01-07 DIAGNOSIS — I272 Other secondary pulmonary hypertension: Secondary | ICD-10-CM | POA: Diagnosis not present

## 2015-01-07 DIAGNOSIS — I472 Ventricular tachycardia: Secondary | ICD-10-CM | POA: Diagnosis not present

## 2015-01-08 DIAGNOSIS — D509 Iron deficiency anemia, unspecified: Secondary | ICD-10-CM | POA: Diagnosis not present

## 2015-01-19 DIAGNOSIS — E099 Drug or chemical induced diabetes mellitus without complications: Secondary | ICD-10-CM | POA: Diagnosis not present

## 2015-01-19 DIAGNOSIS — K135 Oral submucous fibrosis: Secondary | ICD-10-CM | POA: Diagnosis not present

## 2015-01-19 DIAGNOSIS — K117 Disturbances of salivary secretion: Secondary | ICD-10-CM | POA: Diagnosis not present

## 2015-01-19 DIAGNOSIS — T380X5A Adverse effect of glucocorticoids and synthetic analogues, initial encounter: Secondary | ICD-10-CM | POA: Diagnosis not present

## 2015-01-19 DIAGNOSIS — L599 Disorder of the skin and subcutaneous tissue related to radiation, unspecified: Secondary | ICD-10-CM | POA: Diagnosis not present

## 2015-01-20 DIAGNOSIS — L599 Disorder of the skin and subcutaneous tissue related to radiation, unspecified: Secondary | ICD-10-CM | POA: Diagnosis not present

## 2015-01-20 DIAGNOSIS — E099 Drug or chemical induced diabetes mellitus without complications: Secondary | ICD-10-CM | POA: Diagnosis not present

## 2015-01-20 DIAGNOSIS — K117 Disturbances of salivary secretion: Secondary | ICD-10-CM | POA: Diagnosis not present

## 2015-01-20 DIAGNOSIS — K135 Oral submucous fibrosis: Secondary | ICD-10-CM | POA: Diagnosis not present

## 2015-01-20 DIAGNOSIS — T380X5A Adverse effect of glucocorticoids and synthetic analogues, initial encounter: Secondary | ICD-10-CM | POA: Diagnosis not present

## 2015-01-21 DIAGNOSIS — K117 Disturbances of salivary secretion: Secondary | ICD-10-CM | POA: Diagnosis not present

## 2015-01-21 DIAGNOSIS — T380X5A Adverse effect of glucocorticoids and synthetic analogues, initial encounter: Secondary | ICD-10-CM | POA: Diagnosis not present

## 2015-01-21 DIAGNOSIS — E099 Drug or chemical induced diabetes mellitus without complications: Secondary | ICD-10-CM | POA: Diagnosis not present

## 2015-01-21 DIAGNOSIS — K135 Oral submucous fibrosis: Secondary | ICD-10-CM | POA: Diagnosis not present

## 2015-01-21 DIAGNOSIS — L599 Disorder of the skin and subcutaneous tissue related to radiation, unspecified: Secondary | ICD-10-CM | POA: Diagnosis not present

## 2015-01-22 DIAGNOSIS — K135 Oral submucous fibrosis: Secondary | ICD-10-CM | POA: Diagnosis not present

## 2015-01-22 DIAGNOSIS — L599 Disorder of the skin and subcutaneous tissue related to radiation, unspecified: Secondary | ICD-10-CM | POA: Diagnosis not present

## 2015-01-22 DIAGNOSIS — K117 Disturbances of salivary secretion: Secondary | ICD-10-CM | POA: Diagnosis not present

## 2015-01-22 DIAGNOSIS — E099 Drug or chemical induced diabetes mellitus without complications: Secondary | ICD-10-CM | POA: Diagnosis not present

## 2015-01-22 DIAGNOSIS — T380X5A Adverse effect of glucocorticoids and synthetic analogues, initial encounter: Secondary | ICD-10-CM | POA: Diagnosis not present

## 2015-01-25 DIAGNOSIS — K117 Disturbances of salivary secretion: Secondary | ICD-10-CM | POA: Diagnosis not present

## 2015-01-25 DIAGNOSIS — M5136 Other intervertebral disc degeneration, lumbar region: Secondary | ICD-10-CM | POA: Diagnosis not present

## 2015-01-25 DIAGNOSIS — M47896 Other spondylosis, lumbar region: Secondary | ICD-10-CM | POA: Diagnosis not present

## 2015-01-25 DIAGNOSIS — G894 Chronic pain syndrome: Secondary | ICD-10-CM | POA: Diagnosis not present

## 2015-01-25 DIAGNOSIS — T380X5A Adverse effect of glucocorticoids and synthetic analogues, initial encounter: Secondary | ICD-10-CM | POA: Diagnosis not present

## 2015-01-25 DIAGNOSIS — K135 Oral submucous fibrosis: Secondary | ICD-10-CM | POA: Diagnosis not present

## 2015-01-25 DIAGNOSIS — L599 Disorder of the skin and subcutaneous tissue related to radiation, unspecified: Secondary | ICD-10-CM | POA: Diagnosis not present

## 2015-01-25 DIAGNOSIS — Z79891 Long term (current) use of opiate analgesic: Secondary | ICD-10-CM | POA: Diagnosis not present

## 2015-01-25 DIAGNOSIS — M461 Sacroiliitis, not elsewhere classified: Secondary | ICD-10-CM | POA: Diagnosis not present

## 2015-01-25 DIAGNOSIS — E099 Drug or chemical induced diabetes mellitus without complications: Secondary | ICD-10-CM | POA: Diagnosis not present

## 2015-01-26 DIAGNOSIS — L599 Disorder of the skin and subcutaneous tissue related to radiation, unspecified: Secondary | ICD-10-CM | POA: Diagnosis not present

## 2015-01-27 DIAGNOSIS — L599 Disorder of the skin and subcutaneous tissue related to radiation, unspecified: Secondary | ICD-10-CM | POA: Diagnosis not present

## 2015-01-28 DIAGNOSIS — Z7901 Long term (current) use of anticoagulants: Secondary | ICD-10-CM | POA: Diagnosis not present

## 2015-01-28 DIAGNOSIS — I2699 Other pulmonary embolism without acute cor pulmonale: Secondary | ICD-10-CM | POA: Diagnosis not present

## 2015-01-28 DIAGNOSIS — L599 Disorder of the skin and subcutaneous tissue related to radiation, unspecified: Secondary | ICD-10-CM | POA: Diagnosis not present

## 2015-01-29 DIAGNOSIS — K219 Gastro-esophageal reflux disease without esophagitis: Secondary | ICD-10-CM | POA: Diagnosis not present

## 2015-01-29 DIAGNOSIS — T17328A Food in larynx causing other injury, initial encounter: Secondary | ICD-10-CM | POA: Diagnosis not present

## 2015-01-29 DIAGNOSIS — E785 Hyperlipidemia, unspecified: Secondary | ICD-10-CM | POA: Diagnosis not present

## 2015-01-29 DIAGNOSIS — I509 Heart failure, unspecified: Secondary | ICD-10-CM | POA: Diagnosis not present

## 2015-01-29 DIAGNOSIS — Z87891 Personal history of nicotine dependence: Secondary | ICD-10-CM | POA: Diagnosis not present

## 2015-01-29 DIAGNOSIS — Z7901 Long term (current) use of anticoagulants: Secondary | ICD-10-CM | POA: Diagnosis not present

## 2015-01-29 DIAGNOSIS — I429 Cardiomyopathy, unspecified: Secondary | ICD-10-CM | POA: Diagnosis not present

## 2015-01-29 DIAGNOSIS — Z79899 Other long term (current) drug therapy: Secondary | ICD-10-CM | POA: Diagnosis not present

## 2015-01-29 DIAGNOSIS — Z7982 Long term (current) use of aspirin: Secondary | ICD-10-CM | POA: Diagnosis not present

## 2015-01-29 DIAGNOSIS — I10 Essential (primary) hypertension: Secondary | ICD-10-CM | POA: Diagnosis not present

## 2015-01-29 DIAGNOSIS — T18128A Food in esophagus causing other injury, initial encounter: Secondary | ICD-10-CM | POA: Diagnosis not present

## 2015-01-29 DIAGNOSIS — E079 Disorder of thyroid, unspecified: Secondary | ICD-10-CM | POA: Diagnosis not present

## 2015-01-29 DIAGNOSIS — G4733 Obstructive sleep apnea (adult) (pediatric): Secondary | ICD-10-CM | POA: Diagnosis not present

## 2015-02-01 DIAGNOSIS — Z6841 Body Mass Index (BMI) 40.0 and over, adult: Secondary | ICD-10-CM | POA: Diagnosis not present

## 2015-02-01 DIAGNOSIS — Z79899 Other long term (current) drug therapy: Secondary | ICD-10-CM | POA: Diagnosis not present

## 2015-02-01 DIAGNOSIS — R9431 Abnormal electrocardiogram [ECG] [EKG]: Secondary | ICD-10-CM | POA: Diagnosis not present

## 2015-02-01 DIAGNOSIS — Z886 Allergy status to analgesic agent status: Secondary | ICD-10-CM | POA: Diagnosis not present

## 2015-02-01 DIAGNOSIS — J90 Pleural effusion, not elsewhere classified: Secondary | ICD-10-CM | POA: Diagnosis not present

## 2015-02-01 DIAGNOSIS — R0602 Shortness of breath: Secondary | ICD-10-CM | POA: Diagnosis not present

## 2015-02-01 DIAGNOSIS — I509 Heart failure, unspecified: Secondary | ICD-10-CM | POA: Diagnosis not present

## 2015-02-01 DIAGNOSIS — Z7901 Long term (current) use of anticoagulants: Secondary | ICD-10-CM | POA: Diagnosis not present

## 2015-02-01 DIAGNOSIS — Z888 Allergy status to other drugs, medicaments and biological substances status: Secondary | ICD-10-CM | POA: Diagnosis not present

## 2015-02-01 DIAGNOSIS — Z9989 Dependence on other enabling machines and devices: Secondary | ICD-10-CM | POA: Diagnosis not present

## 2015-02-01 DIAGNOSIS — E785 Hyperlipidemia, unspecified: Secondary | ICD-10-CM | POA: Diagnosis not present

## 2015-02-01 DIAGNOSIS — Z87891 Personal history of nicotine dependence: Secondary | ICD-10-CM | POA: Diagnosis not present

## 2015-02-01 DIAGNOSIS — Z7982 Long term (current) use of aspirin: Secondary | ICD-10-CM | POA: Diagnosis not present

## 2015-02-01 DIAGNOSIS — K219 Gastro-esophageal reflux disease without esophagitis: Secondary | ICD-10-CM | POA: Diagnosis not present

## 2015-02-01 DIAGNOSIS — I10 Essential (primary) hypertension: Secondary | ICD-10-CM | POA: Diagnosis not present

## 2015-02-02 DIAGNOSIS — K135 Oral submucous fibrosis: Secondary | ICD-10-CM | POA: Diagnosis not present

## 2015-02-02 DIAGNOSIS — L599 Disorder of the skin and subcutaneous tissue related to radiation, unspecified: Secondary | ICD-10-CM | POA: Diagnosis not present

## 2015-02-02 DIAGNOSIS — M5135 Other intervertebral disc degeneration, thoracolumbar region: Secondary | ICD-10-CM | POA: Diagnosis not present

## 2015-02-02 DIAGNOSIS — I10 Essential (primary) hypertension: Secondary | ICD-10-CM | POA: Diagnosis not present

## 2015-02-02 DIAGNOSIS — M272 Inflammatory conditions of jaws: Secondary | ICD-10-CM | POA: Diagnosis not present

## 2015-02-02 DIAGNOSIS — I5043 Acute on chronic combined systolic (congestive) and diastolic (congestive) heart failure: Secondary | ICD-10-CM | POA: Diagnosis not present

## 2015-02-02 DIAGNOSIS — M47816 Spondylosis without myelopathy or radiculopathy, lumbar region: Secondary | ICD-10-CM | POA: Diagnosis not present

## 2015-02-02 DIAGNOSIS — I272 Other secondary pulmonary hypertension: Secondary | ICD-10-CM | POA: Diagnosis not present

## 2015-02-02 DIAGNOSIS — Z8589 Personal history of malignant neoplasm of other organs and systems: Secondary | ICD-10-CM | POA: Diagnosis not present

## 2015-02-03 DIAGNOSIS — K117 Disturbances of salivary secretion: Secondary | ICD-10-CM | POA: Diagnosis not present

## 2015-02-03 DIAGNOSIS — H6123 Impacted cerumen, bilateral: Secondary | ICD-10-CM | POA: Diagnosis not present

## 2015-02-03 DIAGNOSIS — H903 Sensorineural hearing loss, bilateral: Secondary | ICD-10-CM | POA: Diagnosis not present

## 2015-02-03 DIAGNOSIS — J384 Edema of larynx: Secondary | ICD-10-CM | POA: Diagnosis not present

## 2015-02-03 DIAGNOSIS — R490 Dysphonia: Secondary | ICD-10-CM | POA: Diagnosis not present

## 2015-02-04 DIAGNOSIS — R1310 Dysphagia, unspecified: Secondary | ICD-10-CM | POA: Diagnosis not present

## 2015-02-08 DIAGNOSIS — K135 Oral submucous fibrosis: Secondary | ICD-10-CM | POA: Diagnosis not present

## 2015-02-08 DIAGNOSIS — L599 Disorder of the skin and subcutaneous tissue related to radiation, unspecified: Secondary | ICD-10-CM | POA: Diagnosis not present

## 2015-02-08 DIAGNOSIS — T380X5A Adverse effect of glucocorticoids and synthetic analogues, initial encounter: Secondary | ICD-10-CM | POA: Diagnosis not present

## 2015-02-08 DIAGNOSIS — K117 Disturbances of salivary secretion: Secondary | ICD-10-CM | POA: Diagnosis not present

## 2015-02-08 DIAGNOSIS — E099 Drug or chemical induced diabetes mellitus without complications: Secondary | ICD-10-CM | POA: Diagnosis not present

## 2015-02-09 DIAGNOSIS — T380X5A Adverse effect of glucocorticoids and synthetic analogues, initial encounter: Secondary | ICD-10-CM | POA: Diagnosis not present

## 2015-02-09 DIAGNOSIS — E099 Drug or chemical induced diabetes mellitus without complications: Secondary | ICD-10-CM | POA: Diagnosis not present

## 2015-02-09 DIAGNOSIS — L599 Disorder of the skin and subcutaneous tissue related to radiation, unspecified: Secondary | ICD-10-CM | POA: Diagnosis not present

## 2015-02-09 DIAGNOSIS — K135 Oral submucous fibrosis: Secondary | ICD-10-CM | POA: Diagnosis not present

## 2015-02-09 DIAGNOSIS — K117 Disturbances of salivary secretion: Secondary | ICD-10-CM | POA: Diagnosis not present

## 2015-02-10 DIAGNOSIS — R12 Heartburn: Secondary | ICD-10-CM | POA: Diagnosis not present

## 2015-02-10 DIAGNOSIS — R1310 Dysphagia, unspecified: Secondary | ICD-10-CM | POA: Diagnosis not present

## 2015-02-11 DIAGNOSIS — Z7722 Contact with and (suspected) exposure to environmental tobacco smoke (acute) (chronic): Secondary | ICD-10-CM | POA: Diagnosis not present

## 2015-02-11 DIAGNOSIS — H903 Sensorineural hearing loss, bilateral: Secondary | ICD-10-CM | POA: Diagnosis not present

## 2015-02-11 DIAGNOSIS — J342 Deviated nasal septum: Secondary | ICD-10-CM | POA: Diagnosis not present

## 2015-02-11 DIAGNOSIS — R682 Dry mouth, unspecified: Secondary | ICD-10-CM | POA: Diagnosis not present

## 2015-02-14 DIAGNOSIS — J90 Pleural effusion, not elsewhere classified: Secondary | ICD-10-CM | POA: Diagnosis not present

## 2015-02-14 DIAGNOSIS — I272 Other secondary pulmonary hypertension: Secondary | ICD-10-CM | POA: Diagnosis not present

## 2015-02-14 DIAGNOSIS — Z87891 Personal history of nicotine dependence: Secondary | ICD-10-CM | POA: Diagnosis not present

## 2015-02-14 DIAGNOSIS — Z7901 Long term (current) use of anticoagulants: Secondary | ICD-10-CM | POA: Diagnosis not present

## 2015-02-14 DIAGNOSIS — E039 Hypothyroidism, unspecified: Secondary | ICD-10-CM | POA: Diagnosis not present

## 2015-02-14 DIAGNOSIS — F419 Anxiety disorder, unspecified: Secondary | ICD-10-CM | POA: Diagnosis not present

## 2015-02-14 DIAGNOSIS — M4696 Unspecified inflammatory spondylopathy, lumbar region: Secondary | ICD-10-CM | POA: Diagnosis not present

## 2015-02-14 DIAGNOSIS — R079 Chest pain, unspecified: Secondary | ICD-10-CM | POA: Diagnosis not present

## 2015-02-14 DIAGNOSIS — E785 Hyperlipidemia, unspecified: Secondary | ICD-10-CM | POA: Diagnosis not present

## 2015-02-14 DIAGNOSIS — R0600 Dyspnea, unspecified: Secondary | ICD-10-CM | POA: Diagnosis not present

## 2015-02-14 DIAGNOSIS — M25551 Pain in right hip: Secondary | ICD-10-CM | POA: Diagnosis not present

## 2015-02-14 DIAGNOSIS — M5432 Sciatica, left side: Secondary | ICD-10-CM | POA: Diagnosis not present

## 2015-02-14 DIAGNOSIS — I10 Essential (primary) hypertension: Secondary | ICD-10-CM | POA: Diagnosis not present

## 2015-02-14 DIAGNOSIS — Z7982 Long term (current) use of aspirin: Secondary | ICD-10-CM | POA: Diagnosis not present

## 2015-02-14 DIAGNOSIS — Z79899 Other long term (current) drug therapy: Secondary | ICD-10-CM | POA: Diagnosis not present

## 2015-02-14 DIAGNOSIS — R0602 Shortness of breath: Secondary | ICD-10-CM | POA: Diagnosis not present

## 2015-02-14 DIAGNOSIS — I429 Cardiomyopathy, unspecified: Secondary | ICD-10-CM | POA: Diagnosis not present

## 2015-02-14 DIAGNOSIS — M5136 Other intervertebral disc degeneration, lumbar region: Secondary | ICD-10-CM | POA: Diagnosis not present

## 2015-02-14 DIAGNOSIS — M5430 Sciatica, unspecified side: Secondary | ICD-10-CM | POA: Diagnosis not present

## 2015-02-14 DIAGNOSIS — G4733 Obstructive sleep apnea (adult) (pediatric): Secondary | ICD-10-CM | POA: Diagnosis not present

## 2015-02-14 DIAGNOSIS — I509 Heart failure, unspecified: Secondary | ICD-10-CM | POA: Diagnosis not present

## 2015-02-14 DIAGNOSIS — J449 Chronic obstructive pulmonary disease, unspecified: Secondary | ICD-10-CM | POA: Diagnosis not present

## 2015-02-14 DIAGNOSIS — K219 Gastro-esophageal reflux disease without esophagitis: Secondary | ICD-10-CM | POA: Diagnosis not present

## 2015-02-15 ENCOUNTER — Telehealth: Payer: Self-pay | Admitting: Family Medicine

## 2015-02-15 DIAGNOSIS — M5431 Sciatica, right side: Secondary | ICD-10-CM | POA: Diagnosis not present

## 2015-02-15 NOTE — Telephone Encounter (Signed)
appt scheduled Pt notified 

## 2015-02-15 NOTE — Telephone Encounter (Signed)
Gregg Taylor can you please look and see if you can work him in on [place] or Wed for hospital follow up. Patient states if he does not answer you can leave a day and time for him

## 2015-02-19 ENCOUNTER — Encounter: Payer: Self-pay | Admitting: Family Medicine

## 2015-02-19 ENCOUNTER — Ambulatory Visit (INDEPENDENT_AMBULATORY_CARE_PROVIDER_SITE_OTHER): Payer: Medicare Other | Admitting: Family Medicine

## 2015-02-19 VITALS — Wt 338.6 lb

## 2015-02-19 DIAGNOSIS — IMO0002 Reserved for concepts with insufficient information to code with codable children: Secondary | ICD-10-CM | POA: Insufficient documentation

## 2015-02-19 DIAGNOSIS — E1065 Type 1 diabetes mellitus with hyperglycemia: Secondary | ICD-10-CM | POA: Insufficient documentation

## 2015-02-19 DIAGNOSIS — E1165 Type 2 diabetes mellitus with hyperglycemia: Secondary | ICD-10-CM | POA: Diagnosis not present

## 2015-02-19 DIAGNOSIS — E1140 Type 2 diabetes mellitus with diabetic neuropathy, unspecified: Secondary | ICD-10-CM

## 2015-02-19 DIAGNOSIS — E1069 Type 1 diabetes mellitus with other specified complication: Secondary | ICD-10-CM | POA: Diagnosis not present

## 2015-02-19 DIAGNOSIS — I482 Chronic atrial fibrillation, unspecified: Secondary | ICD-10-CM

## 2015-02-19 DIAGNOSIS — R0600 Dyspnea, unspecified: Secondary | ICD-10-CM

## 2015-02-19 DIAGNOSIS — E108 Type 1 diabetes mellitus with unspecified complications: Secondary | ICD-10-CM

## 2015-02-19 DIAGNOSIS — R0689 Other abnormalities of breathing: Secondary | ICD-10-CM | POA: Diagnosis not present

## 2015-02-19 DIAGNOSIS — I4891 Unspecified atrial fibrillation: Secondary | ICD-10-CM | POA: Insufficient documentation

## 2015-02-19 LAB — POCT CBC
GRANULOCYTE PERCENT: 76.8 % (ref 37–80)
HCT, POC: 42.5 % — AB (ref 43.5–53.7)
HEMOGLOBIN: 12.7 g/dL — AB (ref 14.1–18.1)
Lymph, poc: 1 (ref 0.6–3.4)
MCH, POC: 26.1 pg — AB (ref 27–31.2)
MCHC: 29.8 g/dL — AB (ref 31.8–35.4)
MCV: 87.5 fL (ref 80–97)
MPV: 7.2 fL (ref 0–99.8)
POC Granulocyte: 4.1 (ref 2–6.9)
POC LYMPH PERCENT: 18 %L (ref 10–50)
Platelet Count, POC: 315 10*3/uL (ref 142–424)
RBC: 4.86 M/uL (ref 4.69–6.13)
RDW, POC: 17.1 %
WBC: 5.3 10*3/uL (ref 4.6–10.2)

## 2015-02-19 LAB — POCT GLYCOSYLATED HEMOGLOBIN (HGB A1C): Hemoglobin A1C: 5.9

## 2015-02-19 LAB — POCT INR: INR: 1.3

## 2015-02-19 MED ORDER — ALPRAZOLAM 1 MG PO TABS: 1.0000 mg | ORAL_TABLET | Freq: Three times a day (TID) | ORAL | Status: DC

## 2015-02-19 MED ORDER — OXYCODONE HCL 10 MG PO TABS: 10.0000 mg | ORAL_TABLET | Freq: Four times a day (QID) | ORAL | Status: DC | PRN

## 2015-02-19 NOTE — Progress Notes (Signed)
Subjective:  Patient ID: Gregg Taylor, male    DOB: October 23, 1946  Age: 69 y.o. MRN: [id number]  CC: Pain; Hyperlipidemia; Hypertension; and Congestive Heart Failure   HPI Manu Rubey presents forFollow-up of diabetes. Patient does not check blood sugar at home Patient denies symptoms such as polyuria, polydipsia, excessive hunger, nausea No significant hypoglycemic spells noted. Medications as noted below. Taking them regularly without complication/adverse reaction being reported today.  Having EGD with dilation in a few days so is off coumadin. Meanwhile his reflux has been significant and he is hoping to this dilation will clear the problem  He has chronic neck and back pain due to cervical spondylosis and lumbar radiculopathy for which she has a long-term opiate dependence. The pain has been stable and he brings in pain diaries showing levels to be in the 3-5 range as long as he takes his medication regularly.  His baseline shortness of breath and swelling has not increased recently indicating stability of his congestive cardiomyopathy. History Patryck has a past medical history of Hypertension; Hyperlipidemia; Arthritis; Depression; and CHF (congestive heart failure).   He has past surgical history that includes Appendectomy; Cholecystectomy; Fracture surgery; and Gastric bypass (1991).   His family history includes Alcohol abuse in his father; Cancer in his mother; Depression in his brother and father; Heart disease in his father; Hypertension in his brother and father; Stroke in his brother.He has no tobacco, alcohol, and drug history on file.  No current outpatient prescriptions on file prior to visit.   No current facility-administered medications on file prior to visit.    ROS Review of Systems  Constitutional: Negative for fever, chills, diaphoresis and unexpected weight change.  HENT: Negative for congestion, hearing loss, rhinorrhea, sore throat and trouble swallowing.     Respiratory: Negative for cough, chest tightness, shortness of breath and wheezing.   Gastrointestinal: Negative for nausea, vomiting, abdominal pain, diarrhea, constipation and abdominal distention.  Endocrine: Negative for cold intolerance and heat intolerance.  Genitourinary: Negative for dysuria, hematuria and flank pain.  Musculoskeletal: Negative for joint swelling and arthralgias.  Skin: Negative for color change, pallor and rash.  Neurological: Negative for dizziness and headaches.  Psychiatric/Behavioral: Negative for dysphoric mood, decreased concentration and agitation. The patient is not nervous/anxious.     Objective:  Wt 338 lb 9.6 oz (153.588 kg)  BP Readings from Last 3 Encounters:  No data found for BP    Wt Readings from Last 3 Encounters:  02/19/15 338 lb 9.6 oz (153.588 kg)     Physical Exam  Constitutional: He is oriented to person, place, and time. He appears well-developed and well-nourished. No distress.  Morbidly obese  HENT:  Head: Normocephalic and atraumatic.  Right Ear: External ear normal.  Left Ear: External ear normal.  Nose: Nose normal.  Mouth/Throat: Oropharynx is clear and moist.  Eyes: Conjunctivae and EOM are normal. Pupils are equal, round, and reactive to light.  Neck: Normal range of motion. Neck supple. No thyromegaly present.  Cardiovascular: Normal rate, regular rhythm and normal heart sounds.   No murmur heard. Pulmonary/Chest: Effort normal and breath sounds normal. No respiratory distress. He has no wheezes. He has no rales.  Abdominal: Soft. Bowel sounds are normal. He exhibits no distension. There is no tenderness.  Lymphadenopathy:    He has no cervical adenopathy.  Neurological: He is alert and oriented to person, place, and time. He has normal reflexes.  Skin: Skin is warm and dry.  Psychiatric: He has a normal  mood and affect. His behavior is normal. Judgment and thought content normal.    Lab Results  Component Value  Date   HGBA1C 5.9 02/19/2015    Lab Results  Component Value Date   WBC 5.3 02/19/2015   HGB 12.7* 02/19/2015   HCT 42.5* 02/19/2015   GLUCOSE 99 02/19/2015   ALT 14 02/19/2015   AST 17 02/19/2015   NA 146* 02/19/2015   K 4.1 02/19/2015   CL 103 02/19/2015   CREATININE 1.17 02/19/2015   BUN 18 02/19/2015   CO2 30* 02/19/2015   INR 1.3 02/19/2015   HGBA1C 5.9 02/19/2015     Assessment & Plan:   Lj was seen today for pain, hyperlipidemia, hypertension and congestive heart failure.  Diagnoses and all orders for this visit:  Dyspnea Orders: -     Ambulatory referral to Anticoagulation Monitoring -     POCT INR; Standing -     POCT CBC -     POCT glycosylated hemoglobin (Hb A1C) -     CMP14+EGFR -     Brain natriuretic peptide -     POCT INR  Type 2 diabetes mellitus, uncontrolled, with neuropathy Orders: -     POCT glycosylated hemoglobin (Hb A1C) -     CMP14+EGFR  Chronic atrial fibrillation Orders: -     Ambulatory referral to Anticoagulation Monitoring -     POCT INR; Standing -     POCT CBC -     POCT INR  Other orders -     ALPRAZolam (XANAX) 1 MG tablet; Take 1 tablet (1 mg total) by mouth 3 (three) times daily. -     Oxycodone HCl 10 MG TABS; Take 1 tablet (10 mg total) by mouth every 6 (six) hours as needed.  I have changed Mr. Stettler's ALPRAZolam and Oxycodone HCl. I am also having him maintain his bumetanide, escitalopram, gabapentin, hydrALAZINE, isosorbide mononitrate, levocetirizine, levothyroxine, pantoprazole, quinapril, simvastatin, spironolactone, temazepam, warfarin, and warfarin.  Meds ordered this encounter  Medications  . DISCONTD: ALPRAZolam (XANAX) 1 MG tablet    Sig: Take 1 tablet by mouth 3 (three) times daily.    Refill:  0  . bumetanide (BUMEX) 2 MG tablet    Sig: Take 1 tablet by mouth 2 (two) times daily.    Refill:  6  . escitalopram (LEXAPRO) 20 MG tablet    Sig: Take 1 tablet by mouth daily.    Refill:  2  . gabapentin  (NEURONTIN) 300 MG capsule    Sig: Take 1 capsule by mouth 2 (two) times daily.    Refill:  1  . hydrALAZINE (APRESOLINE) 25 MG tablet    Sig: Take 2 tablets by mouth 3 (three) times daily.    Refill:  2  . isosorbide mononitrate (IMDUR) 30 MG 24 hr tablet    Sig: Take 1 tablet by mouth daily.    Refill:  0  . levocetirizine (XYZAL) 5 MG tablet    Sig: Take 1 tablet by mouth daily.    Refill:  6  . levothyroxine (SYNTHROID, LEVOTHROID) 137 MCG tablet    Sig: Take 1 tablet by mouth daily.    Refill:  1  . DISCONTD: Oxycodone HCl 10 MG TABS    Sig: Take 1 tablet by mouth every 6 (six) hours as needed.    Refill:  0  . pantoprazole (PROTONIX) 40 MG tablet    Sig: Take 1 tablet by mouth 2 (two) times daily.    Refill:  1  . DISCONTD: potassium chloride (K-DUR) 10 MEQ tablet    Sig: Take 4 tablets by mouth daily.    Refill:  1  . quinapril (ACCUPRIL) 40 MG tablet    Sig: Take 1 tablet by mouth 2 (two) times daily.    Refill:  0  . simvastatin (ZOCOR) 20 MG tablet    Sig: Take 1 tablet by mouth at bedtime.    Refill:  1  . spironolactone (ALDACTONE) 25 MG tablet    Sig: Take 1 tablet by mouth daily.    Refill:  1  . temazepam (RESTORIL) 30 MG capsule    Sig: Take 1 capsule by mouth at bedtime.    Refill:  0  . warfarin (COUMADIN) 2 MG tablet    Sig: Take 1 tablet by mouth daily.    Refill:  1  . warfarin (COUMADIN) 5 MG tablet    Sig: Take 1 tablet by mouth daily.    Refill:  2  . ALPRAZolam (XANAX) 1 MG tablet    Sig: Take 1 tablet (1 mg total) by mouth 3 (three) times daily.    Dispense:  30 tablet    Refill:  0  . Oxycodone HCl 10 MG TABS    Sig: Take 1 tablet (10 mg total) by mouth every 6 (six) hours as needed.    Dispense:  120 tablet    Refill:  0     Follow-up: Return in about 3 months (around 05/21/2015), or F/u 2 weeks for coumadin clinic, for diabetes, hypertension, Depression, cholesterol.  Claretta Fraise, M.D.

## 2015-02-19 NOTE — Patient Instructions (Signed)
Coumadin clinic

## 2015-02-20 LAB — CMP14+EGFR
A/G RATIO: 1.4 (ref 1.1–2.5)
ALK PHOS: 79 IU/L (ref 39–117)
ALT: 14 IU/L (ref 0–44)
AST: 17 IU/L (ref 0–40)
Albumin: 3.8 g/dL (ref 3.6–4.8)
BUN / CREAT RATIO: 15 (ref 10–22)
BUN: 18 mg/dL (ref 8–27)
Bilirubin Total: 0.3 mg/dL (ref 0.0–1.2)
CALCIUM: 9.2 mg/dL (ref 8.6–10.2)
CO2: 30 mmol/L — ABNORMAL HIGH (ref 18–29)
Chloride: 103 mmol/L (ref 97–108)
Creatinine, Ser: 1.17 mg/dL (ref 0.76–1.27)
GFR calc non Af Amer: 64 mL/min/{1.73_m2} (ref >59)
GFR, EST AFRICAN AMERICAN: 74 mL/min/{1.73_m2} (ref >59)
GLOBULIN, TOTAL: 2.7 g/dL (ref 1.5–4.5)
GLUCOSE: 99 mg/dL (ref 65–99)
Potassium: 4.1 mmol/L (ref 3.5–5.2)
Sodium: 146 mmol/L — ABNORMAL HIGH (ref 134–144)
Total Protein: 6.5 g/dL (ref 6.0–8.5)

## 2015-02-20 LAB — BRAIN NATRIURETIC PEPTIDE: BNP: 268.8 pg/mL — AB (ref 0.0–100.0)

## 2015-02-22 DIAGNOSIS — Z6841 Body Mass Index (BMI) 40.0 and over, adult: Secondary | ICD-10-CM | POA: Diagnosis not present

## 2015-02-22 DIAGNOSIS — I10 Essential (primary) hypertension: Secondary | ICD-10-CM | POA: Diagnosis not present

## 2015-02-22 DIAGNOSIS — K210 Gastro-esophageal reflux disease with esophagitis: Secondary | ICD-10-CM | POA: Diagnosis not present

## 2015-02-22 DIAGNOSIS — R1310 Dysphagia, unspecified: Secondary | ICD-10-CM | POA: Diagnosis not present

## 2015-02-22 DIAGNOSIS — Z888 Allergy status to other drugs, medicaments and biological substances status: Secondary | ICD-10-CM | POA: Diagnosis not present

## 2015-02-22 DIAGNOSIS — Z7901 Long term (current) use of anticoagulants: Secondary | ICD-10-CM | POA: Diagnosis not present

## 2015-02-22 DIAGNOSIS — Z9884 Bariatric surgery status: Secondary | ICD-10-CM | POA: Diagnosis not present

## 2015-02-22 DIAGNOSIS — Z9981 Dependence on supplemental oxygen: Secondary | ICD-10-CM | POA: Diagnosis not present

## 2015-02-22 DIAGNOSIS — G4733 Obstructive sleep apnea (adult) (pediatric): Secondary | ICD-10-CM | POA: Diagnosis not present

## 2015-02-22 DIAGNOSIS — E785 Hyperlipidemia, unspecified: Secondary | ICD-10-CM | POA: Diagnosis not present

## 2015-02-22 DIAGNOSIS — I509 Heart failure, unspecified: Secondary | ICD-10-CM | POA: Diagnosis not present

## 2015-02-22 DIAGNOSIS — Z87891 Personal history of nicotine dependence: Secondary | ICD-10-CM | POA: Diagnosis not present

## 2015-02-22 DIAGNOSIS — J449 Chronic obstructive pulmonary disease, unspecified: Secondary | ICD-10-CM | POA: Diagnosis not present

## 2015-02-22 DIAGNOSIS — K449 Diaphragmatic hernia without obstruction or gangrene: Secondary | ICD-10-CM | POA: Diagnosis not present

## 2015-02-22 DIAGNOSIS — F419 Anxiety disorder, unspecified: Secondary | ICD-10-CM | POA: Diagnosis not present

## 2015-02-22 DIAGNOSIS — K222 Esophageal obstruction: Secondary | ICD-10-CM | POA: Diagnosis not present

## 2015-02-22 DIAGNOSIS — Z79899 Other long term (current) drug therapy: Secondary | ICD-10-CM | POA: Diagnosis not present

## 2015-02-23 ENCOUNTER — Telehealth: Payer: Self-pay | Admitting: *Deleted

## 2015-02-23 DIAGNOSIS — M5441 Lumbago with sciatica, right side: Secondary | ICD-10-CM | POA: Diagnosis not present

## 2015-02-23 DIAGNOSIS — M9904 Segmental and somatic dysfunction of sacral region: Secondary | ICD-10-CM | POA: Diagnosis not present

## 2015-02-23 DIAGNOSIS — M9903 Segmental and somatic dysfunction of lumbar region: Secondary | ICD-10-CM | POA: Diagnosis not present

## 2015-02-23 DIAGNOSIS — M5137 Other intervertebral disc degeneration, lumbosacral region: Secondary | ICD-10-CM | POA: Diagnosis not present

## 2015-02-23 NOTE — Telephone Encounter (Signed)
-----  Message from Claretta Fraise, MD sent at 02/21/2015  9:11 PM EDT ----- Dwan Bolt,    Your lab result is normal.Some minor variations that are not significant are commonly marked abnormal, but do not represent any medical problem for you.  Best regards, Claretta Fraise, M.D.

## 2015-02-24 ENCOUNTER — Telehealth: Payer: Self-pay | Admitting: Family Medicine

## 2015-02-24 ENCOUNTER — Other Ambulatory Visit: Payer: Self-pay

## 2015-02-24 MED ORDER — POTASSIUM CHLORIDE ER 10 MEQ PO TBCR: 40.0000 meq | EXTENDED_RELEASE_TABLET | Freq: Every day | ORAL | Status: DC

## 2015-02-25 DIAGNOSIS — Z85819 Personal history of malignant neoplasm of unspecified site of lip, oral cavity, and pharynx: Secondary | ICD-10-CM | POA: Diagnosis not present

## 2015-02-25 DIAGNOSIS — M9904 Segmental and somatic dysfunction of sacral region: Secondary | ICD-10-CM | POA: Diagnosis not present

## 2015-02-25 DIAGNOSIS — R1310 Dysphagia, unspecified: Secondary | ICD-10-CM | POA: Diagnosis not present

## 2015-02-25 DIAGNOSIS — M5137 Other intervertebral disc degeneration, lumbosacral region: Secondary | ICD-10-CM | POA: Diagnosis not present

## 2015-02-25 DIAGNOSIS — M5441 Lumbago with sciatica, right side: Secondary | ICD-10-CM | POA: Diagnosis not present

## 2015-02-25 DIAGNOSIS — M9903 Segmental and somatic dysfunction of lumbar region: Secondary | ICD-10-CM | POA: Diagnosis not present

## 2015-02-26 MED ORDER — POTASSIUM CHLORIDE ER 10 MEQ PO TBCR: 40.0000 meq | EXTENDED_RELEASE_TABLET | Freq: Every day | ORAL | Status: DC

## 2015-02-26 NOTE — Telephone Encounter (Signed)
Patient states that he takes potassium 4 BID and would like this rx changed. Patient advised you are out of the office and we will have you address once you return.

## 2015-03-01 DIAGNOSIS — M5441 Lumbago with sciatica, right side: Secondary | ICD-10-CM | POA: Diagnosis not present

## 2015-03-01 DIAGNOSIS — M9904 Segmental and somatic dysfunction of sacral region: Secondary | ICD-10-CM | POA: Diagnosis not present

## 2015-03-01 DIAGNOSIS — M9903 Segmental and somatic dysfunction of lumbar region: Secondary | ICD-10-CM | POA: Diagnosis not present

## 2015-03-01 DIAGNOSIS — M5137 Other intervertebral disc degeneration, lumbosacral region: Secondary | ICD-10-CM | POA: Diagnosis not present

## 2015-03-05 DIAGNOSIS — M5441 Lumbago with sciatica, right side: Secondary | ICD-10-CM | POA: Diagnosis not present

## 2015-03-05 DIAGNOSIS — M5137 Other intervertebral disc degeneration, lumbosacral region: Secondary | ICD-10-CM | POA: Diagnosis not present

## 2015-03-05 DIAGNOSIS — M9903 Segmental and somatic dysfunction of lumbar region: Secondary | ICD-10-CM | POA: Diagnosis not present

## 2015-03-05 DIAGNOSIS — M9904 Segmental and somatic dysfunction of sacral region: Secondary | ICD-10-CM | POA: Diagnosis not present

## 2015-03-08 ENCOUNTER — Encounter: Payer: Self-pay | Admitting: Family Medicine

## 2015-03-08 ENCOUNTER — Ambulatory Visit (INDEPENDENT_AMBULATORY_CARE_PROVIDER_SITE_OTHER): Payer: Medicare Other | Admitting: Family Medicine

## 2015-03-08 VITALS — BP 130/83 | HR 91 | Temp 97.3°F | Ht 68.0 in | Wt 348.4 lb

## 2015-03-08 DIAGNOSIS — M9903 Segmental and somatic dysfunction of lumbar region: Secondary | ICD-10-CM | POA: Diagnosis not present

## 2015-03-08 DIAGNOSIS — I482 Chronic atrial fibrillation, unspecified: Secondary | ICD-10-CM

## 2015-03-08 DIAGNOSIS — M5416 Radiculopathy, lumbar region: Secondary | ICD-10-CM

## 2015-03-08 DIAGNOSIS — M5137 Other intervertebral disc degeneration, lumbosacral region: Secondary | ICD-10-CM | POA: Diagnosis not present

## 2015-03-08 DIAGNOSIS — M9904 Segmental and somatic dysfunction of sacral region: Secondary | ICD-10-CM | POA: Diagnosis not present

## 2015-03-08 DIAGNOSIS — M5441 Lumbago with sciatica, right side: Secondary | ICD-10-CM | POA: Diagnosis not present

## 2015-03-08 LAB — POCT INR: INR: 3.1

## 2015-03-08 MED ORDER — OXYCODONE HCL 10 MG PO TABS: 10.0000 mg | ORAL_TABLET | Freq: Four times a day (QID) | ORAL | Status: DC | PRN

## 2015-03-08 NOTE — Progress Notes (Signed)
Subjective:  Patient ID: Gregg Taylor, male    DOB: 01-18-1946  Age: 69 y.o. MRN: [id number]  CC: Atrial Fibrillation   HPI Gregg Taylor presents for  Patient in for follow-up of atrial fibrillation. Patient denies any recent bouts of chest pain or palpitations. Additionally, patient is taking anticoagulants. Patient denies any recent excessive bleeding episodes including epistaxis, bleeding from the gums, genitalia, rectal bleeding or hematuria. Additionally there has been no excessive bruising.  Also reports 10/10 pain in the right hip and posterior thigh and numbness in the right foot. Some relief with the oxycodone at current level. History was so bad he couldn't even get up to fix his own meal he had to rely on a friend. Patient is currently getting therapy through his chiropractor for the pain.  History Gregg Taylor has a past medical history of Hypertension; Hyperlipidemia; Arthritis; Depression; and CHF (congestive heart failure).   He has past surgical history that includes Appendectomy; Cholecystectomy; Fracture surgery; and Gastric bypass (1991).   His family history includes Alcohol abuse in his father; Cancer in his mother; Depression in his brother and father; Heart disease in his father; Hypertension in his brother and father; Stroke in his brother.He reports that he quit smoking about 12 years ago. His smoking use included Cigarettes. He started smoking about 37 years ago. He smoked 0.50 packs per day. He does not have any smokeless tobacco history on file. He reports that he does not drink alcohol or use illicit drugs.  Current Outpatient Prescriptions on File Prior to Visit  Medication Sig Dispense Refill  . ALPRAZolam (XANAX) 1 MG tablet Take 1 tablet (1 mg total) by mouth 3 (three) times daily. 30 tablet 0  . bumetanide (BUMEX) 2 MG tablet Take 1 tablet by mouth 2 (two) times daily.  6  . escitalopram (LEXAPRO) 20 MG tablet Take 1 tablet by mouth 3 (three) times daily.   2  .  gabapentin (NEURONTIN) 300 MG capsule Take 600 mg by mouth 2 (two) times daily.   1  . hydrALAZINE (APRESOLINE) 25 MG tablet Take 2 tablets by mouth 3 (three) times daily.  2  . isosorbide mononitrate (IMDUR) 30 MG 24 hr tablet Take 1 tablet by mouth daily.  0  . levocetirizine (XYZAL) 5 MG tablet Take 1 tablet by mouth daily.  6  . levothyroxine (SYNTHROID, LEVOTHROID) 137 MCG tablet Take 1 tablet by mouth daily.  1  . pantoprazole (PROTONIX) 40 MG tablet Take 1 tablet by mouth 2 (two) times daily.  1  . potassium chloride (K-DUR) 10 MEQ tablet Take 4 tablets (40 mEq total) by mouth daily. (Patient taking differently: Take 40 mEq by mouth 2 (two) times daily. ) 120 tablet 5  . quinapril (ACCUPRIL) 40 MG tablet Take 1 tablet by mouth 2 (two) times daily.  0  . simvastatin (ZOCOR) 20 MG tablet Take 1 tablet by mouth at bedtime.  1  . spironolactone (ALDACTONE) 25 MG tablet Take 1 tablet by mouth daily.  1  . temazepam (RESTORIL) 30 MG capsule Take 1 capsule by mouth at bedtime.  0  . warfarin (COUMADIN) 2 MG tablet Take 1 tablet by mouth daily.  1  . warfarin (COUMADIN) 5 MG tablet Take 1 tablet by mouth daily.  2   No current facility-administered medications on file prior to visit.    ROS Review of Systems  Constitutional: Negative for fever, chills, diaphoresis and unexpected weight change.  HENT: Negative for congestion, hearing loss, rhinorrhea, sore  throat and trouble swallowing.   Respiratory: Negative for cough, chest tightness, shortness of breath and wheezing.   Gastrointestinal: Negative for nausea, vomiting, abdominal pain, diarrhea, constipation and abdominal distention.  Endocrine: Negative for cold intolerance and heat intolerance.  Genitourinary: Negative for dysuria, hematuria and flank pain.  Musculoskeletal: Positive for myalgias and arthralgias. Negative for joint swelling.  Skin: Negative for rash.  Neurological: Negative for dizziness and headaches.    Psychiatric/Behavioral: Negative for dysphoric mood, decreased concentration and agitation. The patient is not nervous/anxious.     Objective:  BP 130/83 mmHg  Pulse 91  Temp(Src) 97.3 F (36.3 C) (Oral)  Ht _0  (1.727 m)  Wt 348 lb 6.4 oz (158.033 kg)  BMI 52.99 kg/m2  BP Readings from Last 3 Encounters:  03/08/15 130/83    Wt Readings from Last 3 Encounters:  03/08/15 348 lb 6.4 oz (158.033 kg)  02/19/15 338 lb 9.6 oz (153.588 kg)     Physical Exam  Constitutional: He is oriented to person, place, and time. He appears well-developed and well-nourished. No distress.  HENT:  Head: Normocephalic and atraumatic.  Right Ear: External ear normal.  Left Ear: External ear normal.  Nose: Nose normal.  Mouth/Throat: Oropharynx is clear and moist.  Eyes: Conjunctivae and EOM are normal. Pupils are equal, round, and reactive to light.  Neck: Normal range of motion. Neck supple. No thyromegaly present.  Cardiovascular: Normal rate and normal heart sounds.   No murmur heard. Pulmonary/Chest: Effort normal and breath sounds normal. No respiratory distress. He has no wheezes. He has no rales.  Abdominal: Soft. Bowel sounds are normal. He exhibits no distension. There is no tenderness.  Lymphadenopathy:    He has no cervical adenopathy.  Neurological: He is alert and oriented to person, place, and time. He has normal reflexes.  Skin: Skin is warm and dry.  Psychiatric: He has a normal mood and affect. His behavior is normal. Judgment and thought content normal.    Lab Results  Component Value Date   HGBA1C 5.9 02/19/2015    Lab Results  Component Value Date   WBC 5.3 02/19/2015   HGB 12.7* 02/19/2015   HCT 42.5* 02/19/2015   GLUCOSE 99 02/19/2015   ALT 14 02/19/2015   AST 17 02/19/2015   NA 146* 02/19/2015   K 4.1 02/19/2015   CL 103 02/19/2015   CREATININE 1.17 02/19/2015   BUN 18 02/19/2015   CO2 30* 02/19/2015   INR 3.1 03/08/2015   HGBA1C 5.9 02/19/2015     Patient was never admitted.  Assessment & Plan:   Gregg Taylor was seen today for atrial fibrillation.  Diagnoses and all orders for this visit:  Right lumbar radiculopathy  Chronic atrial fibrillation Orders: -     POCT INR  Other orders -     Discontinue: Oxycodone HCl 10 MG TABS; Take 1 tablet (10 mg total) by mouth every 6 (six) hours as needed. -     Oxycodone HCl 10 MG TABS; Take 1 tablet (10 mg total) by mouth every 6 (six) hours as needed.  I am having Gregg Taylor maintain his bumetanide, escitalopram, gabapentin, hydrALAZINE, isosorbide mononitrate, levocetirizine, levothyroxine, pantoprazole, quinapril, simvastatin, spironolactone, temazepam, warfarin, warfarin, ALPRAZolam, potassium chloride, and Oxycodone HCl.  Meds ordered this encounter  Medications  . DISCONTD: Oxycodone HCl 10 MG TABS    Sig: Take 1 tablet (10 mg total) by mouth every 6 (six) hours as needed.    Dispense:  120 tablet    Refill:  0    Do not fill until 03/21/2015  . Oxycodone HCl 10 MG TABS    Sig: Take 1 tablet (10 mg total) by mouth every 6 (six) hours as needed.    Dispense:  120 tablet    Refill:  0    Do not fill until 04/20/2015     Follow-up: Return in about 10 weeks (around 05/17/2015) for  diabetes.  Claretta Fraise, M.D.

## 2015-03-10 DIAGNOSIS — M9903 Segmental and somatic dysfunction of lumbar region: Secondary | ICD-10-CM | POA: Diagnosis not present

## 2015-03-10 DIAGNOSIS — M5441 Lumbago with sciatica, right side: Secondary | ICD-10-CM | POA: Diagnosis not present

## 2015-03-10 DIAGNOSIS — M9904 Segmental and somatic dysfunction of sacral region: Secondary | ICD-10-CM | POA: Diagnosis not present

## 2015-03-10 DIAGNOSIS — M5137 Other intervertebral disc degeneration, lumbosacral region: Secondary | ICD-10-CM | POA: Diagnosis not present

## 2015-03-12 ENCOUNTER — Telehealth: Payer: Self-pay | Admitting: Family Medicine

## 2015-03-12 DIAGNOSIS — M5137 Other intervertebral disc degeneration, lumbosacral region: Secondary | ICD-10-CM | POA: Diagnosis not present

## 2015-03-12 DIAGNOSIS — M9903 Segmental and somatic dysfunction of lumbar region: Secondary | ICD-10-CM | POA: Diagnosis not present

## 2015-03-12 DIAGNOSIS — M5441 Lumbago with sciatica, right side: Secondary | ICD-10-CM | POA: Diagnosis not present

## 2015-03-12 DIAGNOSIS — M9904 Segmental and somatic dysfunction of sacral region: Secondary | ICD-10-CM | POA: Diagnosis not present

## 2015-03-12 MED ORDER — WARFARIN SODIUM 6 MG PO TABS: 6.0000 mg | ORAL_TABLET | Freq: Every day | ORAL | Status: DC

## 2015-03-12 NOTE — Telephone Encounter (Signed)
RX sent into [facility] per Dr Livia Snellen Pt notified

## 2015-03-15 DIAGNOSIS — M9903 Segmental and somatic dysfunction of lumbar region: Secondary | ICD-10-CM | POA: Diagnosis not present

## 2015-03-15 DIAGNOSIS — M5137 Other intervertebral disc degeneration, lumbosacral region: Secondary | ICD-10-CM | POA: Diagnosis not present

## 2015-03-15 DIAGNOSIS — M9904 Segmental and somatic dysfunction of sacral region: Secondary | ICD-10-CM | POA: Diagnosis not present

## 2015-03-15 DIAGNOSIS — M5441 Lumbago with sciatica, right side: Secondary | ICD-10-CM | POA: Diagnosis not present

## 2015-03-17 DIAGNOSIS — M5137 Other intervertebral disc degeneration, lumbosacral region: Secondary | ICD-10-CM | POA: Diagnosis not present

## 2015-03-17 DIAGNOSIS — M5441 Lumbago with sciatica, right side: Secondary | ICD-10-CM | POA: Diagnosis not present

## 2015-03-17 DIAGNOSIS — M9903 Segmental and somatic dysfunction of lumbar region: Secondary | ICD-10-CM | POA: Diagnosis not present

## 2015-03-17 DIAGNOSIS — M9904 Segmental and somatic dysfunction of sacral region: Secondary | ICD-10-CM | POA: Diagnosis not present

## 2015-03-18 DIAGNOSIS — C7889 Secondary malignant neoplasm of other digestive organs: Secondary | ICD-10-CM | POA: Diagnosis not present

## 2015-03-19 DIAGNOSIS — M5137 Other intervertebral disc degeneration, lumbosacral region: Secondary | ICD-10-CM | POA: Diagnosis not present

## 2015-03-19 DIAGNOSIS — M9903 Segmental and somatic dysfunction of lumbar region: Secondary | ICD-10-CM | POA: Diagnosis not present

## 2015-03-19 DIAGNOSIS — M9904 Segmental and somatic dysfunction of sacral region: Secondary | ICD-10-CM | POA: Diagnosis not present

## 2015-03-19 DIAGNOSIS — M5441 Lumbago with sciatica, right side: Secondary | ICD-10-CM | POA: Diagnosis not present

## 2015-03-19 DIAGNOSIS — M25561 Pain in right knee: Secondary | ICD-10-CM | POA: Diagnosis not present

## 2015-03-19 DIAGNOSIS — M549 Dorsalgia, unspecified: Secondary | ICD-10-CM | POA: Diagnosis not present

## 2015-03-19 DIAGNOSIS — M170 Bilateral primary osteoarthritis of knee: Secondary | ICD-10-CM | POA: Diagnosis not present

## 2015-03-22 DIAGNOSIS — M5441 Lumbago with sciatica, right side: Secondary | ICD-10-CM | POA: Diagnosis not present

## 2015-03-22 DIAGNOSIS — M9904 Segmental and somatic dysfunction of sacral region: Secondary | ICD-10-CM | POA: Diagnosis not present

## 2015-03-22 DIAGNOSIS — M9903 Segmental and somatic dysfunction of lumbar region: Secondary | ICD-10-CM | POA: Diagnosis not present

## 2015-03-22 DIAGNOSIS — R1310 Dysphagia, unspecified: Secondary | ICD-10-CM | POA: Diagnosis not present

## 2015-03-22 DIAGNOSIS — M5137 Other intervertebral disc degeneration, lumbosacral region: Secondary | ICD-10-CM | POA: Diagnosis not present

## 2015-03-25 DIAGNOSIS — M5441 Lumbago with sciatica, right side: Secondary | ICD-10-CM | POA: Diagnosis not present

## 2015-03-25 DIAGNOSIS — M9904 Segmental and somatic dysfunction of sacral region: Secondary | ICD-10-CM | POA: Diagnosis not present

## 2015-03-25 DIAGNOSIS — M9903 Segmental and somatic dysfunction of lumbar region: Secondary | ICD-10-CM | POA: Diagnosis not present

## 2015-03-25 DIAGNOSIS — M5137 Other intervertebral disc degeneration, lumbosacral region: Secondary | ICD-10-CM | POA: Diagnosis not present

## 2015-03-29 ENCOUNTER — Ambulatory Visit: Payer: Self-pay | Admitting: Family Medicine

## 2015-03-29 DIAGNOSIS — E1151 Type 2 diabetes mellitus with diabetic peripheral angiopathy without gangrene: Secondary | ICD-10-CM | POA: Diagnosis not present

## 2015-03-29 DIAGNOSIS — L600 Ingrowing nail: Secondary | ICD-10-CM | POA: Diagnosis not present

## 2015-03-30 DIAGNOSIS — M9904 Segmental and somatic dysfunction of sacral region: Secondary | ICD-10-CM | POA: Diagnosis not present

## 2015-03-30 DIAGNOSIS — M5441 Lumbago with sciatica, right side: Secondary | ICD-10-CM | POA: Diagnosis not present

## 2015-03-30 DIAGNOSIS — M5137 Other intervertebral disc degeneration, lumbosacral region: Secondary | ICD-10-CM | POA: Diagnosis not present

## 2015-03-30 DIAGNOSIS — M9903 Segmental and somatic dysfunction of lumbar region: Secondary | ICD-10-CM | POA: Diagnosis not present

## 2015-04-01 DIAGNOSIS — M9904 Segmental and somatic dysfunction of sacral region: Secondary | ICD-10-CM | POA: Diagnosis not present

## 2015-04-01 DIAGNOSIS — M9901 Segmental and somatic dysfunction of cervical region: Secondary | ICD-10-CM | POA: Diagnosis not present

## 2015-04-01 DIAGNOSIS — M9903 Segmental and somatic dysfunction of lumbar region: Secondary | ICD-10-CM | POA: Diagnosis not present

## 2015-04-01 DIAGNOSIS — M5441 Lumbago with sciatica, right side: Secondary | ICD-10-CM | POA: Diagnosis not present

## 2015-04-01 DIAGNOSIS — M5137 Other intervertebral disc degeneration, lumbosacral region: Secondary | ICD-10-CM | POA: Diagnosis not present

## 2015-04-06 DIAGNOSIS — M5137 Other intervertebral disc degeneration, lumbosacral region: Secondary | ICD-10-CM | POA: Diagnosis not present

## 2015-04-06 DIAGNOSIS — M9904 Segmental and somatic dysfunction of sacral region: Secondary | ICD-10-CM | POA: Diagnosis not present

## 2015-04-06 DIAGNOSIS — M5441 Lumbago with sciatica, right side: Secondary | ICD-10-CM | POA: Diagnosis not present

## 2015-04-06 DIAGNOSIS — M9903 Segmental and somatic dysfunction of lumbar region: Secondary | ICD-10-CM | POA: Diagnosis not present

## 2015-04-06 DIAGNOSIS — M9901 Segmental and somatic dysfunction of cervical region: Secondary | ICD-10-CM | POA: Diagnosis not present

## 2015-04-07 DIAGNOSIS — M7542 Impingement syndrome of left shoulder: Secondary | ICD-10-CM | POA: Diagnosis not present

## 2015-04-07 DIAGNOSIS — M25511 Pain in right shoulder: Secondary | ICD-10-CM | POA: Diagnosis not present

## 2015-04-09 DIAGNOSIS — R1310 Dysphagia, unspecified: Secondary | ICD-10-CM | POA: Diagnosis not present

## 2015-04-10 DIAGNOSIS — M2578 Osteophyte, vertebrae: Secondary | ICD-10-CM | POA: Diagnosis not present

## 2015-04-10 DIAGNOSIS — M5126 Other intervertebral disc displacement, lumbar region: Secondary | ICD-10-CM | POA: Diagnosis not present

## 2015-04-10 DIAGNOSIS — M5136 Other intervertebral disc degeneration, lumbar region: Secondary | ICD-10-CM | POA: Diagnosis not present

## 2015-04-10 DIAGNOSIS — M4806 Spinal stenosis, lumbar region: Secondary | ICD-10-CM | POA: Diagnosis not present

## 2015-04-10 DIAGNOSIS — M5127 Other intervertebral disc displacement, lumbosacral region: Secondary | ICD-10-CM | POA: Diagnosis not present

## 2015-04-10 DIAGNOSIS — M5137 Other intervertebral disc degeneration, lumbosacral region: Secondary | ICD-10-CM | POA: Diagnosis not present

## 2015-04-12 ENCOUNTER — Other Ambulatory Visit: Payer: Self-pay | Admitting: Family Medicine

## 2015-04-12 DIAGNOSIS — M9903 Segmental and somatic dysfunction of lumbar region: Secondary | ICD-10-CM | POA: Diagnosis not present

## 2015-04-12 DIAGNOSIS — M5441 Lumbago with sciatica, right side: Secondary | ICD-10-CM | POA: Diagnosis not present

## 2015-04-12 DIAGNOSIS — M9901 Segmental and somatic dysfunction of cervical region: Secondary | ICD-10-CM | POA: Diagnosis not present

## 2015-04-12 DIAGNOSIS — M5137 Other intervertebral disc degeneration, lumbosacral region: Secondary | ICD-10-CM | POA: Diagnosis not present

## 2015-04-12 DIAGNOSIS — M9904 Segmental and somatic dysfunction of sacral region: Secondary | ICD-10-CM | POA: Diagnosis not present

## 2015-04-13 ENCOUNTER — Other Ambulatory Visit: Payer: Self-pay | Admitting: Family Medicine

## 2015-04-14 DIAGNOSIS — I272 Other secondary pulmonary hypertension: Secondary | ICD-10-CM | POA: Diagnosis not present

## 2015-04-14 DIAGNOSIS — I10 Essential (primary) hypertension: Secondary | ICD-10-CM | POA: Diagnosis not present

## 2015-04-14 DIAGNOSIS — R55 Syncope and collapse: Secondary | ICD-10-CM | POA: Diagnosis not present

## 2015-04-14 DIAGNOSIS — I472 Ventricular tachycardia: Secondary | ICD-10-CM | POA: Diagnosis not present

## 2015-04-14 DIAGNOSIS — I5042 Chronic combined systolic (congestive) and diastolic (congestive) heart failure: Secondary | ICD-10-CM | POA: Diagnosis not present

## 2015-04-14 DIAGNOSIS — E780 Pure hypercholesterolemia: Secondary | ICD-10-CM | POA: Diagnosis not present

## 2015-04-16 NOTE — Telephone Encounter (Signed)
Gregg Taylor, patient must have a face to face discussing need for cpap and stating that he is compliant and it is helping. Needs notes and rx faxed to number below or give to debbie.

## 2015-04-19 DIAGNOSIS — M9901 Segmental and somatic dysfunction of cervical region: Secondary | ICD-10-CM | POA: Diagnosis not present

## 2015-04-19 DIAGNOSIS — M5441 Lumbago with sciatica, right side: Secondary | ICD-10-CM | POA: Diagnosis not present

## 2015-04-19 DIAGNOSIS — M9904 Segmental and somatic dysfunction of sacral region: Secondary | ICD-10-CM | POA: Diagnosis not present

## 2015-04-19 DIAGNOSIS — M5137 Other intervertebral disc degeneration, lumbosacral region: Secondary | ICD-10-CM | POA: Diagnosis not present

## 2015-04-19 DIAGNOSIS — M9903 Segmental and somatic dysfunction of lumbar region: Secondary | ICD-10-CM | POA: Diagnosis not present

## 2015-04-19 NOTE — Telephone Encounter (Signed)
Pt notified [facility] Has appt on 7/14 Will do face to face then

## 2015-04-20 ENCOUNTER — Ambulatory Visit (INDEPENDENT_AMBULATORY_CARE_PROVIDER_SITE_OTHER): Payer: Medicare Other | Admitting: Pharmacist Clinician (PhC)/ Clinical Pharmacy Specialist

## 2015-04-20 DIAGNOSIS — R0600 Dyspnea, unspecified: Secondary | ICD-10-CM

## 2015-04-20 DIAGNOSIS — I482 Chronic atrial fibrillation, unspecified: Secondary | ICD-10-CM

## 2015-04-20 LAB — POCT INR: INR: 1.5

## 2015-04-27 DIAGNOSIS — M5137 Other intervertebral disc degeneration, lumbosacral region: Secondary | ICD-10-CM | POA: Diagnosis not present

## 2015-04-27 DIAGNOSIS — M9901 Segmental and somatic dysfunction of cervical region: Secondary | ICD-10-CM | POA: Diagnosis not present

## 2015-04-27 DIAGNOSIS — M9903 Segmental and somatic dysfunction of lumbar region: Secondary | ICD-10-CM | POA: Diagnosis not present

## 2015-04-27 DIAGNOSIS — M9904 Segmental and somatic dysfunction of sacral region: Secondary | ICD-10-CM | POA: Diagnosis not present

## 2015-04-27 DIAGNOSIS — M5441 Lumbago with sciatica, right side: Secondary | ICD-10-CM | POA: Diagnosis not present

## 2015-05-04 DIAGNOSIS — M5441 Lumbago with sciatica, right side: Secondary | ICD-10-CM | POA: Diagnosis not present

## 2015-05-04 DIAGNOSIS — M9901 Segmental and somatic dysfunction of cervical region: Secondary | ICD-10-CM | POA: Diagnosis not present

## 2015-05-04 DIAGNOSIS — M9903 Segmental and somatic dysfunction of lumbar region: Secondary | ICD-10-CM | POA: Diagnosis not present

## 2015-05-04 DIAGNOSIS — M9904 Segmental and somatic dysfunction of sacral region: Secondary | ICD-10-CM | POA: Diagnosis not present

## 2015-05-04 DIAGNOSIS — M5137 Other intervertebral disc degeneration, lumbosacral region: Secondary | ICD-10-CM | POA: Diagnosis not present

## 2015-05-06 ENCOUNTER — Telehealth: Payer: Self-pay | Admitting: Pharmacist

## 2015-05-06 ENCOUNTER — Ambulatory Visit (INDEPENDENT_AMBULATORY_CARE_PROVIDER_SITE_OTHER): Payer: Medicare Other | Admitting: Pharmacist

## 2015-05-06 DIAGNOSIS — I482 Chronic atrial fibrillation, unspecified: Secondary | ICD-10-CM

## 2015-05-06 DIAGNOSIS — R0600 Dyspnea, unspecified: Secondary | ICD-10-CM | POA: Diagnosis not present

## 2015-05-06 LAB — POCT INR: INR: 1.4

## 2015-05-06 MED ORDER — WARFARIN SODIUM 6 MG PO TABS: 6.0000 mg | ORAL_TABLET | Freq: Every day | ORAL | Status: DC

## 2015-05-06 MED ORDER — ALPRAZOLAM 1 MG PO TABS: 1.0000 mg | ORAL_TABLET | Freq: Three times a day (TID) | ORAL | Status: DC

## 2015-05-06 NOTE — Patient Instructions (Signed)
Anticoagulation Dose Instructions as of 05/06/2015      Gregg Taylor Tue Wed Thu Fri Sat   New Dose 9 mg 6 mg 9 mg 6 mg 9 mg 6 mg 9 mg    Description        Take 2 tablets today - Thursday, June 30th, then start new dose of warfarin 76m - take 1 tablet on mondays, wednesdays and fridays and 1 and 1/2 tablets all other days.     INR was 1.4 today

## 2015-05-06 NOTE — Telephone Encounter (Signed)
The prescription has been written as a phone in order. Please be sure this gets called to his pharmacy thanks, [place].

## 2015-05-06 NOTE — Telephone Encounter (Signed)
Refill called to pharmacy's VM

## 2015-05-15 DIAGNOSIS — I429 Cardiomyopathy, unspecified: Secondary | ICD-10-CM | POA: Diagnosis not present

## 2015-05-15 DIAGNOSIS — R4020 Unspecified coma: Secondary | ICD-10-CM | POA: Diagnosis not present

## 2015-05-15 DIAGNOSIS — I959 Hypotension, unspecified: Secondary | ICD-10-CM | POA: Diagnosis not present

## 2015-05-15 DIAGNOSIS — S50311A Abrasion of right elbow, initial encounter: Secondary | ICD-10-CM | POA: Diagnosis not present

## 2015-05-15 DIAGNOSIS — I5042 Chronic combined systolic (congestive) and diastolic (congestive) heart failure: Secondary | ICD-10-CM | POA: Diagnosis not present

## 2015-05-15 DIAGNOSIS — R031 Nonspecific low blood-pressure reading: Secondary | ICD-10-CM | POA: Diagnosis not present

## 2015-05-15 DIAGNOSIS — J989 Respiratory disorder, unspecified: Secondary | ICD-10-CM | POA: Diagnosis not present

## 2015-05-15 DIAGNOSIS — I472 Ventricular tachycardia: Secondary | ICD-10-CM | POA: Diagnosis not present

## 2015-05-15 DIAGNOSIS — R4182 Altered mental status, unspecified: Secondary | ICD-10-CM | POA: Diagnosis not present

## 2015-05-15 DIAGNOSIS — R55 Syncope and collapse: Secondary | ICD-10-CM | POA: Diagnosis not present

## 2015-05-15 DIAGNOSIS — I952 Hypotension due to drugs: Secondary | ICD-10-CM | POA: Diagnosis not present

## 2015-05-15 DIAGNOSIS — I272 Other secondary pulmonary hypertension: Secondary | ICD-10-CM | POA: Diagnosis not present

## 2015-05-15 DIAGNOSIS — J9611 Chronic respiratory failure with hypoxia: Secondary | ICD-10-CM | POA: Diagnosis not present

## 2015-05-16 DIAGNOSIS — J989 Respiratory disorder, unspecified: Secondary | ICD-10-CM | POA: Diagnosis not present

## 2015-05-16 DIAGNOSIS — Z9981 Dependence on supplemental oxygen: Secondary | ICD-10-CM | POA: Diagnosis not present

## 2015-05-16 DIAGNOSIS — I5042 Chronic combined systolic (congestive) and diastolic (congestive) heart failure: Secondary | ICD-10-CM | POA: Diagnosis not present

## 2015-05-16 DIAGNOSIS — R55 Syncope and collapse: Secondary | ICD-10-CM | POA: Diagnosis not present

## 2015-05-16 DIAGNOSIS — I472 Ventricular tachycardia: Secondary | ICD-10-CM | POA: Diagnosis not present

## 2015-05-17 DIAGNOSIS — F419 Anxiety disorder, unspecified: Secondary | ICD-10-CM | POA: Diagnosis present

## 2015-05-17 DIAGNOSIS — I361 Nonrheumatic tricuspid (valve) insufficiency: Secondary | ICD-10-CM | POA: Diagnosis present

## 2015-05-17 DIAGNOSIS — Z6841 Body Mass Index (BMI) 40.0 and over, adult: Secondary | ICD-10-CM | POA: Diagnosis not present

## 2015-05-17 DIAGNOSIS — I472 Ventricular tachycardia: Secondary | ICD-10-CM | POA: Diagnosis not present

## 2015-05-17 DIAGNOSIS — N390 Urinary tract infection, site not specified: Secondary | ICD-10-CM | POA: Diagnosis present

## 2015-05-17 DIAGNOSIS — G8929 Other chronic pain: Secondary | ICD-10-CM | POA: Diagnosis present

## 2015-05-17 DIAGNOSIS — R55 Syncope and collapse: Secondary | ICD-10-CM | POA: Diagnosis not present

## 2015-05-17 DIAGNOSIS — Z7901 Long term (current) use of anticoagulants: Secondary | ICD-10-CM | POA: Diagnosis not present

## 2015-05-17 DIAGNOSIS — M1990 Unspecified osteoarthritis, unspecified site: Secondary | ICD-10-CM | POA: Diagnosis present

## 2015-05-17 DIAGNOSIS — T465X5A Adverse effect of other antihypertensive drugs, initial encounter: Secondary | ICD-10-CM | POA: Diagnosis present

## 2015-05-17 DIAGNOSIS — J989 Respiratory disorder, unspecified: Secondary | ICD-10-CM | POA: Diagnosis not present

## 2015-05-17 DIAGNOSIS — I471 Supraventricular tachycardia: Secondary | ICD-10-CM | POA: Diagnosis not present

## 2015-05-17 DIAGNOSIS — K219 Gastro-esophageal reflux disease without esophagitis: Secondary | ICD-10-CM | POA: Diagnosis present

## 2015-05-17 DIAGNOSIS — I952 Hypotension due to drugs: Secondary | ICD-10-CM | POA: Diagnosis present

## 2015-05-17 DIAGNOSIS — I429 Cardiomyopathy, unspecified: Secondary | ICD-10-CM | POA: Diagnosis not present

## 2015-05-17 DIAGNOSIS — E876 Hypokalemia: Secondary | ICD-10-CM | POA: Diagnosis present

## 2015-05-17 DIAGNOSIS — Z86711 Personal history of pulmonary embolism: Secondary | ICD-10-CM | POA: Diagnosis not present

## 2015-05-17 DIAGNOSIS — I519 Heart disease, unspecified: Secondary | ICD-10-CM | POA: Diagnosis not present

## 2015-05-17 DIAGNOSIS — G4733 Obstructive sleep apnea (adult) (pediatric): Secondary | ICD-10-CM | POA: Diagnosis present

## 2015-05-17 DIAGNOSIS — I340 Nonrheumatic mitral (valve) insufficiency: Secondary | ICD-10-CM | POA: Diagnosis not present

## 2015-05-17 DIAGNOSIS — I272 Other secondary pulmonary hypertension: Secondary | ICD-10-CM | POA: Diagnosis not present

## 2015-05-17 DIAGNOSIS — I5042 Chronic combined systolic (congestive) and diastolic (congestive) heart failure: Secondary | ICD-10-CM | POA: Diagnosis not present

## 2015-05-17 DIAGNOSIS — Z9981 Dependence on supplemental oxygen: Secondary | ICD-10-CM | POA: Diagnosis not present

## 2015-05-17 DIAGNOSIS — I700 Atherosclerosis of aorta: Secondary | ICD-10-CM | POA: Diagnosis not present

## 2015-05-17 DIAGNOSIS — E785 Hyperlipidemia, unspecified: Secondary | ICD-10-CM | POA: Diagnosis present

## 2015-05-17 DIAGNOSIS — B9620 Unspecified Escherichia coli [E. coli] as the cause of diseases classified elsewhere: Secondary | ICD-10-CM | POA: Diagnosis present

## 2015-05-17 DIAGNOSIS — J9611 Chronic respiratory failure with hypoxia: Secondary | ICD-10-CM | POA: Diagnosis present

## 2015-05-17 DIAGNOSIS — I071 Rheumatic tricuspid insufficiency: Secondary | ICD-10-CM | POA: Diagnosis not present

## 2015-05-17 DIAGNOSIS — E039 Hypothyroidism, unspecified: Secondary | ICD-10-CM | POA: Diagnosis present

## 2015-05-17 DIAGNOSIS — Z9989 Dependence on other enabling machines and devices: Secondary | ICD-10-CM | POA: Diagnosis not present

## 2015-05-17 DIAGNOSIS — I10 Essential (primary) hypertension: Secondary | ICD-10-CM | POA: Diagnosis not present

## 2015-05-18 ENCOUNTER — Telehealth: Payer: Self-pay | Admitting: Family Medicine

## 2015-05-20 ENCOUNTER — Ambulatory Visit: Payer: Self-pay | Admitting: Family Medicine

## 2015-05-24 DIAGNOSIS — Z87891 Personal history of nicotine dependence: Secondary | ICD-10-CM | POA: Diagnosis not present

## 2015-05-24 DIAGNOSIS — I5042 Chronic combined systolic (congestive) and diastolic (congestive) heart failure: Secondary | ICD-10-CM | POA: Diagnosis not present

## 2015-05-24 DIAGNOSIS — F419 Anxiety disorder, unspecified: Secondary | ICD-10-CM | POA: Diagnosis not present

## 2015-05-24 DIAGNOSIS — E785 Hyperlipidemia, unspecified: Secondary | ICD-10-CM | POA: Diagnosis not present

## 2015-05-24 DIAGNOSIS — J449 Chronic obstructive pulmonary disease, unspecified: Secondary | ICD-10-CM | POA: Diagnosis not present

## 2015-05-24 DIAGNOSIS — Z9981 Dependence on supplemental oxygen: Secondary | ICD-10-CM | POA: Diagnosis not present

## 2015-05-24 DIAGNOSIS — I10 Essential (primary) hypertension: Secondary | ICD-10-CM | POA: Diagnosis not present

## 2015-05-24 DIAGNOSIS — Z6841 Body Mass Index (BMI) 40.0 and over, adult: Secondary | ICD-10-CM | POA: Diagnosis not present

## 2015-05-24 DIAGNOSIS — Z86711 Personal history of pulmonary embolism: Secondary | ICD-10-CM | POA: Diagnosis not present

## 2015-05-24 DIAGNOSIS — K219 Gastro-esophageal reflux disease without esophagitis: Secondary | ICD-10-CM | POA: Diagnosis not present

## 2015-05-25 DIAGNOSIS — J392 Other diseases of pharynx: Secondary | ICD-10-CM | POA: Diagnosis not present

## 2015-05-25 DIAGNOSIS — J3802 Paralysis of vocal cords and larynx, bilateral: Secondary | ICD-10-CM | POA: Diagnosis not present

## 2015-05-25 DIAGNOSIS — J370 Chronic laryngitis: Secondary | ICD-10-CM | POA: Diagnosis not present

## 2015-05-25 DIAGNOSIS — K117 Disturbances of salivary secretion: Secondary | ICD-10-CM | POA: Diagnosis not present

## 2015-05-26 DIAGNOSIS — K219 Gastro-esophageal reflux disease without esophagitis: Secondary | ICD-10-CM | POA: Diagnosis not present

## 2015-05-26 DIAGNOSIS — F419 Anxiety disorder, unspecified: Secondary | ICD-10-CM | POA: Diagnosis not present

## 2015-05-26 DIAGNOSIS — I5042 Chronic combined systolic (congestive) and diastolic (congestive) heart failure: Secondary | ICD-10-CM | POA: Diagnosis not present

## 2015-05-26 DIAGNOSIS — I10 Essential (primary) hypertension: Secondary | ICD-10-CM | POA: Diagnosis not present

## 2015-05-26 DIAGNOSIS — J449 Chronic obstructive pulmonary disease, unspecified: Secondary | ICD-10-CM | POA: Diagnosis not present

## 2015-05-26 DIAGNOSIS — E785 Hyperlipidemia, unspecified: Secondary | ICD-10-CM | POA: Diagnosis not present

## 2015-05-27 DIAGNOSIS — F331 Major depressive disorder, recurrent, moderate: Secondary | ICD-10-CM | POA: Diagnosis not present

## 2015-05-28 ENCOUNTER — Encounter: Payer: Self-pay | Admitting: Family Medicine

## 2015-05-28 ENCOUNTER — Ambulatory Visit (INDEPENDENT_AMBULATORY_CARE_PROVIDER_SITE_OTHER): Payer: Medicare Other | Admitting: Family Medicine

## 2015-05-28 VITALS — BP 118/56 | HR 76 | Temp 97.1°F | Ht 68.0 in | Wt 354.4 lb

## 2015-05-28 DIAGNOSIS — F419 Anxiety disorder, unspecified: Secondary | ICD-10-CM | POA: Diagnosis not present

## 2015-05-28 DIAGNOSIS — M5106 Intervertebral disc disorders with myelopathy, lumbar region: Secondary | ICD-10-CM | POA: Diagnosis not present

## 2015-05-28 DIAGNOSIS — I482 Chronic atrial fibrillation, unspecified: Secondary | ICD-10-CM

## 2015-05-28 DIAGNOSIS — J449 Chronic obstructive pulmonary disease, unspecified: Secondary | ICD-10-CM | POA: Diagnosis not present

## 2015-05-28 DIAGNOSIS — I10 Essential (primary) hypertension: Secondary | ICD-10-CM | POA: Diagnosis not present

## 2015-05-28 DIAGNOSIS — R0600 Dyspnea, unspecified: Secondary | ICD-10-CM | POA: Diagnosis not present

## 2015-05-28 DIAGNOSIS — M5126 Other intervertebral disc displacement, lumbar region: Secondary | ICD-10-CM | POA: Diagnosis not present

## 2015-05-28 DIAGNOSIS — K219 Gastro-esophageal reflux disease without esophagitis: Secondary | ICD-10-CM | POA: Diagnosis not present

## 2015-05-28 DIAGNOSIS — R9430 Abnormal result of cardiovascular function study, unspecified: Secondary | ICD-10-CM | POA: Diagnosis not present

## 2015-05-28 DIAGNOSIS — I420 Dilated cardiomyopathy: Secondary | ICD-10-CM | POA: Diagnosis not present

## 2015-05-28 DIAGNOSIS — E139 Other specified diabetes mellitus without complications: Secondary | ICD-10-CM

## 2015-05-28 DIAGNOSIS — E785 Hyperlipidemia, unspecified: Secondary | ICD-10-CM | POA: Diagnosis not present

## 2015-05-28 DIAGNOSIS — I5042 Chronic combined systolic (congestive) and diastolic (congestive) heart failure: Secondary | ICD-10-CM | POA: Diagnosis not present

## 2015-05-28 LAB — POCT INR: INR: 1.8

## 2015-05-28 LAB — POCT CBC
GRANULOCYTE PERCENT: 76.7 % (ref 37–80)
HCT, POC: 37.2 % — AB (ref 43.5–53.7)
Hemoglobin: 11.9 g/dL — AB (ref 14.1–18.1)
LYMPH, POC: 1.4 (ref 0.6–3.4)
MCH: 27.4 pg (ref 27–31.2)
MCHC: 32 g/dL (ref 31.8–35.4)
MCV: 85.8 fL (ref 80–97)
MPV: 7.8 fL (ref 0–99.8)
POC Granulocyte: 5.7 (ref 2–6.9)
POC LYMPH PERCENT: 18.5 %L (ref 10–50)
Platelet Count, POC: 293 10*3/uL (ref 142–424)
RBC: 4.34 M/uL — AB (ref 4.69–6.13)
RDW, POC: 16.8 %
WBC: 7.4 10*3/uL (ref 4.6–10.2)

## 2015-05-28 MED ORDER — OXYCODONE HCL 10 MG PO TABS: 10.0000 mg | ORAL_TABLET | Freq: Four times a day (QID) | ORAL | Status: DC | PRN

## 2015-05-28 MED ORDER — WARFARIN SODIUM 3 MG PO TABS: 9.0000 mg | ORAL_TABLET | Freq: Every day | ORAL | Status: DC

## 2015-05-28 NOTE — Progress Notes (Signed)
Subjective:  Patient ID: Gregg Taylor, male    DOB: Aug 09, 1946  Age: 69 y.o. MRN: [id number]  CC: Hospitalization Follow-up   HPI Gregg Taylor presents for recheck after cardiac catheterization and hospitalization for congestive heart failure. Currently patient states that symptoms have stabilized. He continues to have edema but it is much decreased and closer to baseline for him. He tends to have significant hypo-kalemia living using diarrhea etc. and is in today for recheck of his potassium. He also is taking warfarin for his atrial fibrillation. He is due for recheck of his INR. He denies any excessive bleeding. He has had no bruising.   follow-up of hypertension. Patient has no history of headache or recent cough. Patient also denies symptoms of TIA such as numbness weakness lateralizing. Patient checks  blood pressure at home and has not had any elevated readings recently. Patient denies side effects from his medication. States taking it regularly.  He continues to have low back pain. This radiates down the legs. He had an MRI done since last visit showing herniated disc at L2 and L4 with compression of the L2 and L4 nerve roots.   History Gregg Taylor has a past medical history of Hypertension; Hyperlipidemia; Arthritis; Depression; and CHF (congestive heart failure).   He has past surgical history that includes Appendectomy; Cholecystectomy; Fracture surgery; and Gastric bypass (1991).   His family history includes Alcohol abuse in his father; Cancer in his mother; Depression in his brother and father; Heart disease in his father; Hypertension in his brother and father; Stroke in his brother.He reports that he quit smoking about 12 years ago. His smoking use included Cigarettes. He started smoking about 37 years ago. He smoked 0.50 packs per day. He does not have any smokeless tobacco history on file. He reports that he does not drink alcohol or use illicit drugs.  Outpatient Prescriptions Prior  to Visit  Medication Sig Dispense Refill  . ALPRAZolam (XANAX) 1 MG tablet Take 1 tablet (1 mg total) by mouth 3 (three) times daily. 90 tablet 5  . bumetanide (BUMEX) 2 MG tablet Take 1 tablet by mouth 2 (two) times daily.  6  . escitalopram (LEXAPRO) 20 MG tablet Take 1 tablet by mouth 3 (three) times daily.   2  . gabapentin (NEURONTIN) 300 MG capsule Take 600 mg by mouth 2 (two) times daily.   1  . levocetirizine (XYZAL) 5 MG tablet Take 1 tablet by mouth daily.  6  . levothyroxine (SYNTHROID, LEVOTHROID) 137 MCG tablet Take 1 tablet by mouth daily.  1  . pantoprazole (PROTONIX) 40 MG tablet Take 1 tablet by mouth 2 (two) times daily.  1  . potassium chloride (K-DUR) 10 MEQ tablet Take 4 tablets (40 mEq total) by mouth daily. (Patient taking differently: Take 40 mEq by mouth 2 (two) times daily. ) 120 tablet 5  . quinapril (ACCUPRIL) 40 MG tablet Take 20 mg by mouth daily.   0  . simvastatin (ZOCOR) 20 MG tablet Take 1 tablet by mouth at bedtime.  1  . spironolactone (ALDACTONE) 25 MG tablet Take 1 tablet by mouth daily.  1  . temazepam (RESTORIL) 30 MG capsule Take 1 capsule by mouth at bedtime.  0  . Oxycodone HCl 10 MG TABS Take 1 tablet (10 mg total) by mouth every 6 (six) hours as needed. 120 tablet 0  . warfarin (COUMADIN) 6 MG tablet Take 1-1.5 tablets (6-9 mg total) by mouth daily. Per anticoagulation clinic directions 100 tablet 1  .  hydrALAZINE (APRESOLINE) 25 MG tablet Take 2 tablets by mouth 3 (three) times daily.  2  . isosorbide mononitrate (IMDUR) 30 MG 24 hr tablet Take 1 tablet by mouth daily.  0  . warfarin (COUMADIN) 2 MG tablet Take 1 tablet by mouth daily.  1   No facility-administered medications prior to visit.    ROS Review of Systems  Constitutional: Negative for fever, chills and diaphoresis.  HENT: Negative for congestion, rhinorrhea and sore throat.   Eyes: Negative.   Respiratory: Positive for shortness of breath. Negative for cough and wheezing.     Cardiovascular: Positive for leg swelling. Negative for chest pain.  Gastrointestinal: Positive for constipation. Negative for nausea, vomiting, abdominal pain, diarrhea and abdominal distention.  Genitourinary: Negative for dysuria and frequency.  Musculoskeletal: Negative for joint swelling and arthralgias.  Skin: Negative for rash.  Neurological: Negative for headaches.    Objective:  BP 118/56 mmHg  Pulse 76  Temp(Src) 97.1 F (36.2 C) (Oral)  Ht _0  (1.727 m)  Wt 354 lb 6.4 oz (160.755 kg)  BMI 53.90 kg/m2  BP Readings from Last 3 Encounters:  05/28/15 118/56  03/08/15 130/83    Wt Readings from Last 3 Encounters:  05/28/15 354 lb 6.4 oz (160.755 kg)  03/08/15 348 lb 6.4 oz (158.033 kg)  02/19/15 338 lb 9.6 oz (153.588 kg)     Physical Exam  Constitutional: He is oriented to person, place, and time. He appears well-developed and well-nourished. No distress.  HENT:  Head: Normocephalic and atraumatic.  Right Ear: External ear normal.  Left Ear: External ear normal.  Nose: Nose normal.  Mouth/Throat: Oropharynx is clear and moist.  Eyes: Conjunctivae and EOM are normal. Pupils are equal, round, and reactive to light.  Neck: Normal range of motion. Neck supple. No thyromegaly present.  Cardiovascular: Normal rate and normal heart sounds.   No murmur heard. Pulmonary/Chest: Effort normal and breath sounds normal. No respiratory distress. He has no wheezes. He has no rales.  Abdominal: Soft. Bowel sounds are normal. He exhibits no distension. There is no tenderness.  Musculoskeletal: He exhibits edema.  Lymphadenopathy:    He has no cervical adenopathy.  Neurological: He is alert and oriented to person, place, and time. He has normal reflexes.  Skin: Skin is warm and dry.  Psychiatric: He has a normal mood and affect. His behavior is normal. Judgment and thought content normal.    Lab Results  Component Value Date   HGBA1C 5.9 02/19/2015    Lab Results   Component Value Date   WBC 7.4 05/28/2015   HGB 11.9* 05/28/2015   HCT 37.2* 05/28/2015   GLUCOSE 98 05/28/2015   CHOL 122 05/28/2015   TRIG 53 05/28/2015   HDL 74 05/28/2015   LDLCALC 37 05/28/2015   ALT 15 05/28/2015   AST 17 05/28/2015   NA 148* 05/28/2015   K 4.2 05/28/2015   CL 102 05/28/2015   CREATININE 0.96 05/28/2015   BUN 19 05/28/2015   CO2 32* 05/28/2015   TSH 0.890 05/28/2015   INR 1.8 05/28/2015   HGBA1C 5.9 02/19/2015    Patient was never admitted.  Assessment & Plan:   Gregg Taylor was seen today for hospitalization follow-up.  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 Orders: -     POCT CBC -     Cancel: POCT glycosylated hemoglobin (Hb A1C) -     CMP14+EGFR -     Lipid panel -  Cancel: POCT INR -     Thyroid Panel With TSH  Chronic atrial fibrillation Orders: -     POCT CBC -     Cancel: POCT glycosylated hemoglobin (Hb A1C) -     CMP14+EGFR -     Lipid panel -     Cancel: POCT INR -     Thyroid Panel With TSH -     POCT INR -     Ambulatory referral to Hospice  Congestive dilated cardiomyopathy Orders: -     POCT CBC -     Cancel: POCT glycosylated hemoglobin (Hb A1C) -     CMP14+EGFR -     Lipid panel -     Cancel: POCT INR -     Thyroid Panel With TSH -     Ambulatory referral to Hospice  Cardiac LV ejection fraction 30-35% Orders: -     POCT CBC -     Cancel: POCT glycosylated hemoglobin (Hb A1C) -     CMP14+EGFR -     Lipid panel -     Cancel: POCT INR -     Thyroid Panel With TSH -     Ambulatory referral to Hospice  Dyspnea Orders: -     POCT CBC -     Cancel: POCT glycosylated hemoglobin (Hb A1C) -     CMP14+EGFR -     Lipid panel -     Cancel: POCT INR -     Thyroid Panel With TSH -     POCT INR  Lumbar herniated disc Orders: -     POCT CBC -     Cancel: POCT glycosylated hemoglobin (Hb A1C) -     CMP14+EGFR -     Lipid panel -     Cancel: POCT INR -     Thyroid Panel  With TSH  Intervertebral lumbar disc disorder with myelopathy, lumbar region Orders: -     POCT CBC -     Cancel: POCT glycosylated hemoglobin (Hb A1C) -     CMP14+EGFR -     Lipid panel -     Cancel: POCT INR -     Thyroid Panel With TSH  Other orders -     Oxycodone HCl 10 MG TABS; Take 1 tablet (10 mg total) by mouth every 6 (six) hours as needed. -     warfarin (COUMADIN) 3 MG tablet; Take 3 tablets (9 mg total) by mouth daily. Per anticoagulation clinic directions   I have discontinued Mr. Biegel's hydrALAZINE, isosorbide mononitrate, warfarin, and warfarin. I have also changed his warfarin. Additionally, I am having him maintain his bumetanide, escitalopram, gabapentin, levocetirizine, levothyroxine, pantoprazole, quinapril, simvastatin, spironolactone, temazepam, potassium chloride, ALPRAZolam, vitamin C, aspirin, Vitamin D3, metoprolol succinate, multivitamin, OXYGEN, Respiratory Therapy Supplies, vitamin B-12, and Oxycodone HCl.  Meds ordered this encounter  Medications  . Ascorbic Acid (VITAMIN C) 1000 MG tablet    Sig: Take 1,000 mg by mouth.  Marland Kitchen aspirin 81 MG chewable tablet    Sig: Chew 81 mg by mouth daily.   . Cholecalciferol (VITAMIN D3) 2000 UNITS TABS    Sig: Take 2,000 Units by mouth.  . metoprolol succinate (TOPROL-XL) 25 MG 24 hr tablet    Sig: Take 25 mg by mouth 2 (two) times daily.   . Multiple Vitamin (MULTIVITAMIN) tablet    Sig: Take 1 tablet by mouth.  . OXYGEN    Sig: Inhale 2 L into the lungs.  Marland Kitchen DISCONTD: potassium chloride (  K-DUR,KLOR-CON) 10 MEQ tablet    Sig: Take 40 mEq by mouth.  . Respiratory Therapy Supplies MISC    Sig: CPAP at HS.  . vitamin B-12 (CYANOCOBALAMIN) 500 MCG tablet    Sig: Take 500 mcg by mouth.  . DISCONTD: warfarin (COUMADIN) 6 MG tablet    Sig: Take 9 mg by mouth.  . Oxycodone HCl 10 MG TABS    Sig: Take 1 tablet (10 mg total) by mouth every 6 (six) hours as needed.    Dispense:  120 tablet    Refill:  0    Do not fill  until 04/20/2015  . warfarin (COUMADIN) 3 MG tablet    Sig: Take 3 tablets (9 mg total) by mouth daily. Per anticoagulation clinic directions    Dispense:  90 tablet    Refill:  2     Follow-up: Return in about 1 month (around 06/28/2015).  Claretta Fraise, M.D.

## 2015-05-29 LAB — CMP14+EGFR
ALBUMIN: 3.7 g/dL (ref 3.6–4.8)
ALT: 15 IU/L (ref 0–44)
AST: 17 IU/L (ref 0–40)
Albumin/Globulin Ratio: 1.7 (ref 1.1–2.5)
Alkaline Phosphatase: 76 IU/L (ref 39–117)
BUN/Creatinine Ratio: 20 (ref 10–22)
BUN: 19 mg/dL (ref 8–27)
Bilirubin Total: 0.2 mg/dL (ref 0.0–1.2)
CALCIUM: 9.1 mg/dL (ref 8.6–10.2)
CO2: 32 mmol/L — AB (ref 18–29)
Chloride: 102 mmol/L (ref 97–108)
Creatinine, Ser: 0.96 mg/dL (ref 0.76–1.27)
GFR calc Af Amer: 93 mL/min/{1.73_m2} (ref >59)
GFR calc non Af Amer: 80 mL/min/{1.73_m2} (ref >59)
GLOBULIN, TOTAL: 2.2 g/dL (ref 1.5–4.5)
Glucose: 98 mg/dL (ref 65–99)
Potassium: 4.2 mmol/L (ref 3.5–5.2)
Sodium: 148 mmol/L — ABNORMAL HIGH (ref 134–144)
Total Protein: 5.9 g/dL — ABNORMAL LOW (ref 6.0–8.5)

## 2015-05-29 LAB — THYROID PANEL WITH TSH
FREE THYROXINE INDEX: 2.5 (ref 1.2–4.9)
T3 Uptake Ratio: 28 % (ref 24–39)
T4, Total: 9 ug/dL (ref 4.5–12.0)
TSH: 0.89 u[IU]/mL (ref 0.450–4.500)

## 2015-05-29 LAB — LIPID PANEL
Chol/HDL Ratio: 1.6 ratio units (ref 0.0–5.0)
Cholesterol, Total: 122 mg/dL (ref 100–199)
HDL: 74 mg/dL (ref >39)
LDL Calculated: 37 mg/dL (ref 0–99)
TRIGLYCERIDES: 53 mg/dL (ref 0–149)
VLDL CHOLESTEROL CAL: 11 mg/dL (ref 5–40)

## 2015-05-30 DIAGNOSIS — I10 Essential (primary) hypertension: Secondary | ICD-10-CM | POA: Diagnosis not present

## 2015-05-30 DIAGNOSIS — E785 Hyperlipidemia, unspecified: Secondary | ICD-10-CM | POA: Diagnosis not present

## 2015-05-30 DIAGNOSIS — F419 Anxiety disorder, unspecified: Secondary | ICD-10-CM | POA: Diagnosis not present

## 2015-05-30 DIAGNOSIS — K219 Gastro-esophageal reflux disease without esophagitis: Secondary | ICD-10-CM | POA: Diagnosis not present

## 2015-05-30 DIAGNOSIS — J449 Chronic obstructive pulmonary disease, unspecified: Secondary | ICD-10-CM | POA: Diagnosis not present

## 2015-05-30 DIAGNOSIS — I5042 Chronic combined systolic (congestive) and diastolic (congestive) heart failure: Secondary | ICD-10-CM | POA: Diagnosis not present

## 2015-05-31 ENCOUNTER — Encounter: Payer: Self-pay | Admitting: *Deleted

## 2015-05-31 ENCOUNTER — Telehealth: Payer: Self-pay | Admitting: *Deleted

## 2015-05-31 NOTE — Telephone Encounter (Signed)
Pt did not qualify for hospice at this time.

## 2015-06-02 DIAGNOSIS — I10 Essential (primary) hypertension: Secondary | ICD-10-CM | POA: Diagnosis not present

## 2015-06-02 DIAGNOSIS — F419 Anxiety disorder, unspecified: Secondary | ICD-10-CM | POA: Diagnosis not present

## 2015-06-02 DIAGNOSIS — J449 Chronic obstructive pulmonary disease, unspecified: Secondary | ICD-10-CM | POA: Diagnosis not present

## 2015-06-02 DIAGNOSIS — E785 Hyperlipidemia, unspecified: Secondary | ICD-10-CM | POA: Diagnosis not present

## 2015-06-02 DIAGNOSIS — K219 Gastro-esophageal reflux disease without esophagitis: Secondary | ICD-10-CM | POA: Diagnosis not present

## 2015-06-02 DIAGNOSIS — I5042 Chronic combined systolic (congestive) and diastolic (congestive) heart failure: Secondary | ICD-10-CM | POA: Diagnosis not present

## 2015-06-04 DIAGNOSIS — K219 Gastro-esophageal reflux disease without esophagitis: Secondary | ICD-10-CM | POA: Diagnosis not present

## 2015-06-04 DIAGNOSIS — E785 Hyperlipidemia, unspecified: Secondary | ICD-10-CM | POA: Diagnosis not present

## 2015-06-04 DIAGNOSIS — I10 Essential (primary) hypertension: Secondary | ICD-10-CM | POA: Diagnosis not present

## 2015-06-04 DIAGNOSIS — J449 Chronic obstructive pulmonary disease, unspecified: Secondary | ICD-10-CM | POA: Diagnosis not present

## 2015-06-04 DIAGNOSIS — F419 Anxiety disorder, unspecified: Secondary | ICD-10-CM | POA: Diagnosis not present

## 2015-06-04 DIAGNOSIS — I5042 Chronic combined systolic (congestive) and diastolic (congestive) heart failure: Secondary | ICD-10-CM | POA: Diagnosis not present

## 2015-06-07 DIAGNOSIS — E785 Hyperlipidemia, unspecified: Secondary | ICD-10-CM | POA: Diagnosis not present

## 2015-06-07 DIAGNOSIS — K219 Gastro-esophageal reflux disease without esophagitis: Secondary | ICD-10-CM | POA: Diagnosis not present

## 2015-06-07 DIAGNOSIS — I5042 Chronic combined systolic (congestive) and diastolic (congestive) heart failure: Secondary | ICD-10-CM | POA: Diagnosis not present

## 2015-06-07 DIAGNOSIS — I10 Essential (primary) hypertension: Secondary | ICD-10-CM | POA: Diagnosis not present

## 2015-06-07 DIAGNOSIS — F419 Anxiety disorder, unspecified: Secondary | ICD-10-CM | POA: Diagnosis not present

## 2015-06-07 DIAGNOSIS — J449 Chronic obstructive pulmonary disease, unspecified: Secondary | ICD-10-CM | POA: Diagnosis not present

## 2015-06-09 DIAGNOSIS — E785 Hyperlipidemia, unspecified: Secondary | ICD-10-CM | POA: Diagnosis not present

## 2015-06-09 DIAGNOSIS — I10 Essential (primary) hypertension: Secondary | ICD-10-CM | POA: Diagnosis not present

## 2015-06-09 DIAGNOSIS — I5042 Chronic combined systolic (congestive) and diastolic (congestive) heart failure: Secondary | ICD-10-CM | POA: Diagnosis not present

## 2015-06-09 DIAGNOSIS — F419 Anxiety disorder, unspecified: Secondary | ICD-10-CM | POA: Diagnosis not present

## 2015-06-09 DIAGNOSIS — J449 Chronic obstructive pulmonary disease, unspecified: Secondary | ICD-10-CM | POA: Diagnosis not present

## 2015-06-09 DIAGNOSIS — K219 Gastro-esophageal reflux disease without esophagitis: Secondary | ICD-10-CM | POA: Diagnosis not present

## 2015-06-11 ENCOUNTER — Ambulatory Visit (INDEPENDENT_AMBULATORY_CARE_PROVIDER_SITE_OTHER): Payer: Medicare Other | Admitting: Family Medicine

## 2015-06-11 VITALS — BP 109/63 | HR 65 | Temp 97.0°F | Ht 68.0 in | Wt 363.0 lb

## 2015-06-11 DIAGNOSIS — R0600 Dyspnea, unspecified: Secondary | ICD-10-CM | POA: Diagnosis not present

## 2015-06-11 DIAGNOSIS — R9430 Abnormal result of cardiovascular function study, unspecified: Secondary | ICD-10-CM | POA: Diagnosis not present

## 2015-06-11 DIAGNOSIS — I420 Dilated cardiomyopathy: Secondary | ICD-10-CM | POA: Diagnosis not present

## 2015-06-11 DIAGNOSIS — E1165 Type 2 diabetes mellitus with hyperglycemia: Secondary | ICD-10-CM

## 2015-06-11 DIAGNOSIS — E1140 Type 2 diabetes mellitus with diabetic neuropathy, unspecified: Secondary | ICD-10-CM

## 2015-06-11 DIAGNOSIS — I482 Chronic atrial fibrillation, unspecified: Secondary | ICD-10-CM

## 2015-06-11 DIAGNOSIS — Z7409 Other reduced mobility: Secondary | ICD-10-CM

## 2015-06-11 DIAGNOSIS — M5106 Intervertebral disc disorders with myelopathy, lumbar region: Secondary | ICD-10-CM | POA: Diagnosis not present

## 2015-06-11 DIAGNOSIS — IMO0002 Reserved for concepts with insufficient information to code with codable children: Secondary | ICD-10-CM | POA: Insufficient documentation

## 2015-06-11 LAB — POCT INR: INR: 3.7

## 2015-06-11 LAB — GLUCOSE, POCT (MANUAL RESULT ENTRY): POC GLUCOSE: 115 mg/dL — AB (ref 70–99)

## 2015-06-11 MED ORDER — METOPROLOL SUCCINATE ER 25 MG PO TB24: 25.0000 mg | ORAL_TABLET | Freq: Two times a day (BID) | ORAL | Status: DC

## 2015-06-11 MED ORDER — WARFARIN SODIUM 4 MG PO TABS: 8.0000 mg | ORAL_TABLET | Freq: Every day | ORAL | Status: DC

## 2015-06-11 NOTE — Progress Notes (Signed)
Subjective:  Patient ID: Gregg Taylor, male    DOB: 03/14/1946  Age: 69 y.o. MRN: [id number]  CC: Cardiomyopathy   HPI [name] presents for  Patient in for follow-up of atrial fibrillation. Patient denies any recent bouts of chest pain or palpitations. Additionally, patient is taking anticoagulants. Patient denies any recent excessive bleeding episodes including epistaxis, bleeding from the gums, genitalia, rectal bleeding or hematuria. Additionally there has been no excessive bruising.  Swelling has been under good control and he denies shortness of breath. His pain is unchanged. 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 Sugar has been stable and he is not checking regularly at this time.   follow-up of hypertension. Patient has no history of headache chest pain or shortness of breath or recent cough. Patient also denies symptoms of TIA such as numbness weakness lateralizing. Patient checks  blood pressure at home and has not had any elevated readings recently. Patient denies side effects from his medication. States taking it regularly.  Patient states that he is attempting. A scooter for use around the home. He has multiple diagnoses that lead him to be at high risk for falls in the home. The scooter should help prevent these falls and promote his ability to perform ADLs.   History Jahking has a past medical history of Hypertension; Hyperlipidemia; Arthritis; Depression; and CHF (congestive heart failure).   He has past surgical history that includes Appendectomy; Cholecystectomy; Fracture surgery; and Gastric bypass (1991).   His family history includes Alcohol abuse in his father; Cancer in his mother; Depression in his brother and father; Heart disease in his father; Hypertension in his brother and father; Stroke in his brother.He reports that he quit smoking about 12 years ago. His smoking use included Cigarettes. He  started smoking about 37 years ago. He smoked 0.50 packs per day. He does not have any smokeless tobacco history on file. He reports that he does not drink alcohol or use illicit drugs.  Outpatient Prescriptions Prior to Visit  Medication Sig Dispense Refill  . ALPRAZolam (XANAX) 1 MG tablet Take 1 tablet (1 mg total) by mouth 3 (three) times daily. 90 tablet 5  . Ascorbic Acid (VITAMIN C) 1000 MG tablet Take 1,000 mg by mouth.    Marland Kitchen aspirin 81 MG chewable tablet Chew 81 mg by mouth daily.     . bumetanide (BUMEX) 2 MG tablet Take 1 tablet by mouth 2 (two) times daily.  6  . Cholecalciferol (VITAMIN D3) 2000 UNITS TABS Take 2,000 Units by mouth.    . escitalopram (LEXAPRO) 20 MG tablet Take 1 tablet by mouth 3 (three) times daily.   2  . gabapentin (NEURONTIN) 300 MG capsule Take 600 mg by mouth 2 (two) times daily.   1  . levocetirizine (XYZAL) 5 MG tablet Take 1 tablet by mouth daily.  6  . levothyroxine (SYNTHROID, LEVOTHROID) 137 MCG tablet Take 1 tablet by mouth daily.  1  . Multiple Vitamin (MULTIVITAMIN) tablet Take 1 tablet by mouth.    . Oxycodone HCl 10 MG TABS Take 1 tablet (10 mg total) by mouth every 6 (six) hours as needed. 120 tablet 0  . OXYGEN Inhale 2 L into the lungs.    . pantoprazole (PROTONIX) 40 MG tablet Take 1 tablet by mouth 2 (two) times daily.  1  . potassium chloride (K-DUR) 10 MEQ tablet Take 4 tablets (40 mEq total) by mouth daily. (Patient taking  differently: Take 40 mEq by mouth 2 (two) times daily. ) 120 tablet 5  . quinapril (ACCUPRIL) 40 MG tablet Take 20 mg by mouth daily.   0  . Respiratory Therapy Supplies MISC CPAP at HS.    . simvastatin (ZOCOR) 20 MG tablet Take 1 tablet by mouth at bedtime.  1  . spironolactone (ALDACTONE) 25 MG tablet Take 1 tablet by mouth daily.  1  . temazepam (RESTORIL) 30 MG capsule Take 1 capsule by mouth at bedtime.  0  . vitamin B-12 (CYANOCOBALAMIN) 500 MCG tablet Take 500 mcg by mouth.    . metoprolol succinate (TOPROL-XL)  25 MG 24 hr tablet Take 25 mg by mouth 2 (two) times daily.     Marland Kitchen warfarin (COUMADIN) 3 MG tablet Take 3 tablets (9 mg total) by mouth daily. Per anticoagulation clinic directions 90 tablet 2   No facility-administered medications prior to visit.    ROS Review of Systems  Objective:  BP 109/63 mmHg  Pulse 65  Temp(Src) 97 F (36.1 C)  Ht _0  (1.727 m)  Wt 363 lb (164.656 kg)  BMI 55.21 kg/m2  BP Readings from Last 3 Encounters:  06/11/15 109/63  05/28/15 118/56  03/08/15 130/83    Wt Readings from Last 3 Encounters:  06/11/15 363 lb (164.656 kg)  05/28/15 354 lb 6.4 oz (160.755 kg)  03/08/15 348 lb 6.4 oz (158.033 kg)     Physical Exam  Lab Results  Component Value Date   HGBA1C 5.9 02/19/2015    Lab Results  Component Value Date   WBC 7.4 05/28/2015   HGB 11.9* 05/28/2015   HCT 37.2* 05/28/2015   GLUCOSE 98 05/28/2015   CHOL 122 05/28/2015   TRIG 53 05/28/2015   HDL 74 05/28/2015   LDLCALC 37 05/28/2015   ALT 15 05/28/2015   AST 17 05/28/2015   NA 148* 05/28/2015   K 4.2 05/28/2015   CL 102 05/28/2015   CREATININE 0.96 05/28/2015   BUN 19 05/28/2015   CO2 32* 05/28/2015   TSH 0.890 05/28/2015   INR 3.7 06/11/2015   HGBA1C 5.9 02/19/2015    Patient was never admitted.  Assessment & Plan:   Corbyn was seen today for cardiomyopathy.  Diagnoses and all orders for this visit:  Chronic atrial fibrillation Orders: -     POCT INR  Congestive dilated cardiomyopathy  Cardiac LV ejection fraction 30-35%  Intervertebral lumbar disc disorder with myelopathy, lumbar region Orders: -     DME Other see comment  Type 2 diabetes mellitus, uncontrolled, with neuropathy Orders: -     POCT glucose (manual entry)  Dyspnea Orders: -     POCT INR -     DME Other see comment  Impaired mobility Orders: -     DME Other see comment  Other orders -     warfarin (COUMADIN) 4 MG tablet; Take 2 tablets (8 mg total) by mouth daily. Per anticoagulation  clinic directions -     metoprolol succinate (TOPROL-XL) 25 MG 24 hr tablet; Take 1 tablet (25 mg total) by mouth 2 (two) times daily.   I have changed Mr. Platner's warfarin and metoprolol succinate. I am also having him maintain his bumetanide, escitalopram, gabapentin, levocetirizine, levothyroxine, pantoprazole, quinapril, simvastatin, spironolactone, temazepam, potassium chloride, ALPRAZolam, vitamin C, aspirin, Vitamin D3, multivitamin, OXYGEN, Respiratory Therapy Supplies, vitamin B-12, Oxycodone HCl, and warfarin.  Meds ordered this encounter  Medications  . DISCONTD: warfarin (COUMADIN) 6 MG tablet    Sig:  Refill:  0  . warfarin (COUMADIN) 4 MG tablet    Sig: Take 2 tablets (8 mg total) by mouth daily. Per anticoagulation clinic directions    Dispense:  60 tablet    Refill:  2  . warfarin (COUMADIN) 3 MG tablet    Sig:     Refill:  1  . metoprolol succinate (TOPROL-XL) 25 MG 24 hr tablet    Sig: Take 1 tablet (25 mg total) by mouth 2 (two) times daily.    Dispense:  60 tablet    Refill:  5     Follow-up: Return in about 2 weeks (around 06/25/2015).  Claretta Fraise, M.D.

## 2015-06-14 DIAGNOSIS — F419 Anxiety disorder, unspecified: Secondary | ICD-10-CM | POA: Diagnosis not present

## 2015-06-14 DIAGNOSIS — E785 Hyperlipidemia, unspecified: Secondary | ICD-10-CM | POA: Diagnosis not present

## 2015-06-14 DIAGNOSIS — J449 Chronic obstructive pulmonary disease, unspecified: Secondary | ICD-10-CM | POA: Diagnosis not present

## 2015-06-14 DIAGNOSIS — K219 Gastro-esophageal reflux disease without esophagitis: Secondary | ICD-10-CM | POA: Diagnosis not present

## 2015-06-14 DIAGNOSIS — I10 Essential (primary) hypertension: Secondary | ICD-10-CM | POA: Diagnosis not present

## 2015-06-14 DIAGNOSIS — I5042 Chronic combined systolic (congestive) and diastolic (congestive) heart failure: Secondary | ICD-10-CM | POA: Diagnosis not present

## 2015-06-16 DIAGNOSIS — E785 Hyperlipidemia, unspecified: Secondary | ICD-10-CM | POA: Diagnosis not present

## 2015-06-16 DIAGNOSIS — J449 Chronic obstructive pulmonary disease, unspecified: Secondary | ICD-10-CM | POA: Diagnosis not present

## 2015-06-16 DIAGNOSIS — F419 Anxiety disorder, unspecified: Secondary | ICD-10-CM | POA: Diagnosis not present

## 2015-06-16 DIAGNOSIS — K219 Gastro-esophageal reflux disease without esophagitis: Secondary | ICD-10-CM | POA: Diagnosis not present

## 2015-06-16 DIAGNOSIS — I5042 Chronic combined systolic (congestive) and diastolic (congestive) heart failure: Secondary | ICD-10-CM | POA: Diagnosis not present

## 2015-06-16 DIAGNOSIS — I10 Essential (primary) hypertension: Secondary | ICD-10-CM | POA: Diagnosis not present

## 2015-06-21 DIAGNOSIS — E785 Hyperlipidemia, unspecified: Secondary | ICD-10-CM | POA: Diagnosis not present

## 2015-06-21 DIAGNOSIS — F419 Anxiety disorder, unspecified: Secondary | ICD-10-CM | POA: Diagnosis not present

## 2015-06-21 DIAGNOSIS — J449 Chronic obstructive pulmonary disease, unspecified: Secondary | ICD-10-CM | POA: Diagnosis not present

## 2015-06-21 DIAGNOSIS — I5042 Chronic combined systolic (congestive) and diastolic (congestive) heart failure: Secondary | ICD-10-CM | POA: Diagnosis not present

## 2015-06-21 DIAGNOSIS — I10 Essential (primary) hypertension: Secondary | ICD-10-CM | POA: Diagnosis not present

## 2015-06-21 DIAGNOSIS — K219 Gastro-esophageal reflux disease without esophagitis: Secondary | ICD-10-CM | POA: Diagnosis not present

## 2015-06-23 ENCOUNTER — Ambulatory Visit: Payer: Self-pay

## 2015-06-24 DIAGNOSIS — K219 Gastro-esophageal reflux disease without esophagitis: Secondary | ICD-10-CM | POA: Diagnosis not present

## 2015-06-24 DIAGNOSIS — F419 Anxiety disorder, unspecified: Secondary | ICD-10-CM | POA: Diagnosis not present

## 2015-06-24 DIAGNOSIS — I10 Essential (primary) hypertension: Secondary | ICD-10-CM | POA: Diagnosis not present

## 2015-06-24 DIAGNOSIS — E785 Hyperlipidemia, unspecified: Secondary | ICD-10-CM | POA: Diagnosis not present

## 2015-06-24 DIAGNOSIS — J449 Chronic obstructive pulmonary disease, unspecified: Secondary | ICD-10-CM | POA: Diagnosis not present

## 2015-06-24 DIAGNOSIS — I5042 Chronic combined systolic (congestive) and diastolic (congestive) heart failure: Secondary | ICD-10-CM | POA: Diagnosis not present

## 2015-06-25 ENCOUNTER — Ambulatory Visit (INDEPENDENT_AMBULATORY_CARE_PROVIDER_SITE_OTHER): Payer: Medicare Other | Admitting: Family Medicine

## 2015-06-25 ENCOUNTER — Encounter: Payer: Self-pay | Admitting: Family Medicine

## 2015-06-25 VITALS — Ht 68.0 in | Wt 367.4 lb

## 2015-06-25 DIAGNOSIS — M5106 Intervertebral disc disorders with myelopathy, lumbar region: Secondary | ICD-10-CM | POA: Diagnosis not present

## 2015-06-25 DIAGNOSIS — I482 Chronic atrial fibrillation, unspecified: Secondary | ICD-10-CM

## 2015-06-25 DIAGNOSIS — M1710 Unilateral primary osteoarthritis, unspecified knee: Secondary | ICD-10-CM | POA: Insufficient documentation

## 2015-06-25 DIAGNOSIS — R0600 Dyspnea, unspecified: Secondary | ICD-10-CM

## 2015-06-25 DIAGNOSIS — M129 Arthropathy, unspecified: Secondary | ICD-10-CM | POA: Diagnosis not present

## 2015-06-25 LAB — POCT INR: INR: 3.3

## 2015-06-25 MED ORDER — OXYCODONE HCL 10 MG PO TABS: 10.0000 mg | ORAL_TABLET | Freq: Four times a day (QID) | ORAL | Status: DC | PRN

## 2015-06-25 NOTE — Progress Notes (Addendum)
Subjective:  Patient ID: Gregg Taylor, male    DOB: Mar 26, 1946  Age: 69 y.o. MRN: [id number]  CC: Cardiomyopathy and motorized scooter evaluation   HPI Gregg Taylor presents for weak legs from arthritis and lumbar radiculopathy. Also morbid obesity causes instability. Gets dyspneic as well. Increased over last 3 days, even for ADLs.  Can notperform toileting, dressing or feeding himself due to immobility.Too weak to use walker for over a few feet. Gets dizzy, falls. Legs too weak to support him. Insufficient strength to push his weight. Severe arthritis in shoulders. Shoulder - 5/10 to 8/10 on right with strenuous activity. Back is 6/10 when using med. 10/10 without opiate. Knees at 8/10 even with opiates. Getting injections in knees and shoulders with ortho.  History Gregg Taylor has a past medical history of Hypertension; Hyperlipidemia; Arthritis; Depression; and CHF (congestive heart failure).   He has past surgical history that includes Appendectomy; Cholecystectomy; Fracture surgery; and Gastric bypass (1991).   His family history includes Alcohol abuse in his father; Cancer in his mother; Depression in his brother and father; Heart disease in his father; Hypertension in his brother and father; Stroke in his brother.He reports that he quit smoking about 12 years ago. His smoking use included Cigarettes. He started smoking about 37 years ago. He smoked 0.50 packs per day. He does not have any smokeless tobacco history on file. He reports that he does not drink alcohol or use illicit drugs.  Outpatient Prescriptions Prior to Visit  Medication Sig Dispense Refill  . ALPRAZolam (XANAX) 1 MG tablet Take 1 tablet (1 mg total) by mouth 3 (three) times daily. 90 tablet 5  . Ascorbic Acid (VITAMIN C) 1000 MG tablet Take 1,000 mg by mouth.    Marland Kitchen aspirin 81 MG chewable tablet Chew 81 mg by mouth daily.     . bumetanide (BUMEX) 2 MG tablet Take 1 tablet by mouth 2 (two) times daily.  6  . Cholecalciferol  (VITAMIN D3) 2000 UNITS TABS Take 2,000 Units by mouth.    . escitalopram (LEXAPRO) 20 MG tablet Take 1 tablet by mouth 3 (three) times daily.   2  . gabapentin (NEURONTIN) 300 MG capsule Take 600 mg by mouth 2 (two) times daily.   1  . levocetirizine (XYZAL) 5 MG tablet Take 1 tablet by mouth daily.  6  . levothyroxine (SYNTHROID, LEVOTHROID) 137 MCG tablet Take 1 tablet by mouth daily.  1  . metoprolol succinate (TOPROL-XL) 25 MG 24 hr tablet Take 1 tablet (25 mg total) by mouth 2 (two) times daily. 60 tablet 5  . Multiple Vitamin (MULTIVITAMIN) tablet Take 1 tablet by mouth.    . OXYGEN Inhale 2 L into the lungs.    . pantoprazole (PROTONIX) 40 MG tablet Take 1 tablet by mouth 2 (two) times daily.  1  . potassium chloride (K-DUR) 10 MEQ tablet Take 4 tablets (40 mEq total) by mouth daily. (Patient taking differently: Take 40 mEq by mouth 2 (two) times daily. ) 120 tablet 5  . quinapril (ACCUPRIL) 40 MG tablet Take 20 mg by mouth 2 (two) times daily.   0  . Respiratory Therapy Supplies MISC CPAP at HS.    . simvastatin (ZOCOR) 20 MG tablet Take 1 tablet by mouth at bedtime.  1  . spironolactone (ALDACTONE) 25 MG tablet Take 1 tablet by mouth daily.  1  . temazepam (RESTORIL) 30 MG capsule Take 1 capsule by mouth at bedtime.  0  . vitamin B-12 (CYANOCOBALAMIN) 500  MCG tablet Take 500 mcg by mouth.    . warfarin (COUMADIN) 3 MG tablet   1  . warfarin (COUMADIN) 4 MG tablet Take 2 tablets (8 mg total) by mouth daily. Per anticoagulation clinic directions 60 tablet 2  . Oxycodone HCl 10 MG TABS Take 1 tablet (10 mg total) by mouth every 6 (six) hours as needed. 120 tablet 0   No facility-administered medications prior to visit.    ROS Review of Systems  Constitutional: Negative for fever, chills and diaphoresis.  HENT: Negative for congestion, rhinorrhea and sore throat.   Respiratory: Positive for shortness of breath. Negative for cough and wheezing.   Cardiovascular: Positive for leg  swelling. Negative for chest pain.  Gastrointestinal: Negative for nausea, vomiting, abdominal pain, diarrhea, constipation and abdominal distention.  Genitourinary: Negative for dysuria and frequency.  Musculoskeletal: Positive for myalgias, back pain, joint swelling and arthralgias.  Skin: Negative for rash.  Neurological: Negative for headaches.    Objective:  Ht _0  (1.727 m)  Wt 367 lb 6.4 oz (166.652 kg)  BMI 55.88 kg/m2  BP Readings from Last 3 Encounters:  06/11/15 109/63  05/28/15 118/56  03/08/15 130/83    Wt Readings from Last 3 Encounters:  06/25/15 367 lb 6.4 oz (166.652 kg)  06/11/15 363 lb (164.656 kg)  05/28/15 354 lb 6.4 oz (160.755 kg)     Physical Exam  Constitutional: He is oriented to person, place, and time. He appears well-developed and well-nourished. No distress.  Morbidly obese  HENT:  Head: Normocephalic and atraumatic.  Right Ear: External ear normal.  Left Ear: External ear normal.  Nose: Nose normal.  Mouth/Throat: Oropharynx is clear and moist.  Eyes: Conjunctivae and EOM are normal. Pupils are equal, round, and reactive to light.  Neck: Normal range of motion. Neck supple. No thyromegaly present.  Cardiovascular: Normal rate, regular rhythm and normal heart sounds.   No murmur heard. Pulmonary/Chest: Effort normal and breath sounds normal. No respiratory distress. He has no wheezes. He has no rales.  Abdominal: Soft. Bowel sounds are normal. He exhibits no distension. There is no tenderness.  Musculoskeletal: He exhibits edema (1+ BLE).  Can lift himself from chair to walker, but unable to ambulate effectively without fall risk.  Lymphadenopathy:    He has no cervical adenopathy.  Neurological: He is alert and oriented to person, place, and time. He has normal reflexes. He exhibits abnormal muscle tone (poor muscle tone without focal deficit for all four extremities). Coordination abnormal.  Skin: Skin is warm and dry.  Psychiatric: He  has a normal mood and affect. His behavior is normal. Judgment and thought content normal.    Lab Results  Component Value Date   HGBA1C 5.9 02/19/2015    Lab Results  Component Value Date   WBC 7.4 05/28/2015   HGB 11.9* 05/28/2015   HCT 37.2* 05/28/2015   GLUCOSE 98 05/28/2015   CHOL 122 05/28/2015   TRIG 53 05/28/2015   HDL 74 05/28/2015   LDLCALC 37 05/28/2015   ALT 15 05/28/2015   AST 17 05/28/2015   NA 148* 05/28/2015   K 4.2 05/28/2015   CL 102 05/28/2015   CREATININE 0.96 05/28/2015   BUN 19 05/28/2015   CO2 32* 05/28/2015   TSH 0.890 05/28/2015   INR 3.3 06/25/2015   HGBA1C 5.9 02/19/2015    Patient was never admitted.  Assessment & Plan:   Gregg Taylor was seen today for cardiomyopathy and motorized scooter evaluation.  Diagnoses and all  orders for this visit:  Intervertebral lumbar disc disorder with myelopathy, lumbar region  Dyspnea -     POCT INR -     CMP14+EGFR -     Brain natriuretic peptide  Chronic atrial fibrillation -     POCT INR -     CMP14+EGFR  Morbid obesity  Arthritis of knee  Other orders -     Oxycodone HCl 10 MG TABS; Take 1 tablet (10 mg total) by mouth every 6 (six) hours as needed.   I am having [name] maintain his bumetanide, escitalopram, gabapentin, levocetirizine, levothyroxine, pantoprazole, quinapril, simvastatin, spironolactone, temazepam, potassium chloride, ALPRAZolam, vitamin C, aspirin, Vitamin D3, multivitamin, OXYGEN, Respiratory Therapy Supplies, vitamin B-12, warfarin, warfarin, metoprolol succinate, and Oxycodone HCl.  Meds ordered this encounter  Medications  . Oxycodone HCl 10 MG TABS    Sig: Take 1 tablet (10 mg total) by mouth every 6 (six) hours as needed.    Dispense:  120 tablet    Refill:  0   Patient has multiple diagnoses each of which contribute to his weakness and inability to ambulate. He is weakened in general by his dyspnea from congestive heart failure and his chronic atrial fibrillation as  well as his obesity. His specific weakness is based on lumbar myelopathy and osteoarthritis of the knee. This makes it impossible to perform routine ADLs as noted above. However, he does have sufficient strength to transfer from the scooter to a chair/ toilet and back to the scooter.  He is mentally competent to operate the steering mechanism.    The scooter should significantly increase his ability to perform ADLs within the home.  Follow-up: Return in about 1 month (around 07/26/2015).  Claretta Fraise, M.D.

## 2015-06-26 ENCOUNTER — Other Ambulatory Visit: Payer: Self-pay | Admitting: Family Medicine

## 2015-06-26 LAB — CMP14+EGFR
ALT: 18 IU/L (ref 0–44)
AST: 19 IU/L (ref 0–40)
Albumin/Globulin Ratio: 1.5 (ref 1.1–2.5)
Albumin: 3.7 g/dL (ref 3.6–4.8)
Alkaline Phosphatase: 83 IU/L (ref 39–117)
BUN/Creatinine Ratio: 23 — ABNORMAL HIGH (ref 10–22)
BUN: 23 mg/dL (ref 8–27)
Bilirubin Total: <0.2 mg/dL (ref 0.0–1.2)
CALCIUM: 9.3 mg/dL (ref 8.6–10.2)
CO2: 30 mmol/L — AB (ref 18–29)
Chloride: 100 mmol/L (ref 97–108)
Creatinine, Ser: 1.02 mg/dL (ref 0.76–1.27)
GFR calc Af Amer: 86 mL/min/{1.73_m2} (ref >59)
GFR, EST NON AFRICAN AMERICAN: 75 mL/min/{1.73_m2} (ref >59)
GLOBULIN, TOTAL: 2.4 g/dL (ref 1.5–4.5)
Glucose: 100 mg/dL — ABNORMAL HIGH (ref 65–99)
Potassium: 5.1 mmol/L (ref 3.5–5.2)
SODIUM: 145 mmol/L — AB (ref 134–144)
Total Protein: 6.1 g/dL (ref 6.0–8.5)

## 2015-06-26 LAB — BRAIN NATRIURETIC PEPTIDE: BNP: 169.9 pg/mL — ABNORMAL HIGH (ref 0.0–100.0)

## 2015-06-28 DIAGNOSIS — L600 Ingrowing nail: Secondary | ICD-10-CM | POA: Diagnosis not present

## 2015-06-28 DIAGNOSIS — E1151 Type 2 diabetes mellitus with diabetic peripheral angiopathy without gangrene: Secondary | ICD-10-CM | POA: Diagnosis not present

## 2015-07-02 DIAGNOSIS — M170 Bilateral primary osteoarthritis of knee: Secondary | ICD-10-CM | POA: Diagnosis not present

## 2015-07-02 DIAGNOSIS — M25562 Pain in left knee: Secondary | ICD-10-CM | POA: Diagnosis not present

## 2015-07-02 DIAGNOSIS — E669 Obesity, unspecified: Secondary | ICD-10-CM | POA: Diagnosis not present

## 2015-07-02 DIAGNOSIS — M25561 Pain in right knee: Secondary | ICD-10-CM | POA: Diagnosis not present

## 2015-07-07 ENCOUNTER — Ambulatory Visit (INDEPENDENT_AMBULATORY_CARE_PROVIDER_SITE_OTHER): Payer: Medicare Other | Admitting: Family Medicine

## 2015-07-07 DIAGNOSIS — K219 Gastro-esophageal reflux disease without esophagitis: Secondary | ICD-10-CM

## 2015-07-07 DIAGNOSIS — I5042 Chronic combined systolic (congestive) and diastolic (congestive) heart failure: Secondary | ICD-10-CM

## 2015-07-07 DIAGNOSIS — J449 Chronic obstructive pulmonary disease, unspecified: Secondary | ICD-10-CM

## 2015-07-07 DIAGNOSIS — I10 Essential (primary) hypertension: Secondary | ICD-10-CM | POA: Diagnosis not present

## 2015-07-08 ENCOUNTER — Ambulatory Visit (INDEPENDENT_AMBULATORY_CARE_PROVIDER_SITE_OTHER): Payer: Medicare Other | Admitting: Pharmacist

## 2015-07-08 ENCOUNTER — Telehealth: Payer: Self-pay | Admitting: Pharmacist

## 2015-07-08 ENCOUNTER — Encounter: Payer: Self-pay | Admitting: Pharmacist

## 2015-07-08 VITALS — BP 112/70 | HR 57 | Ht 68.0 in | Wt 367.0 lb

## 2015-07-08 DIAGNOSIS — R0600 Dyspnea, unspecified: Secondary | ICD-10-CM

## 2015-07-08 DIAGNOSIS — Z0000 Encounter for general adult medical examination without abnormal findings: Secondary | ICD-10-CM

## 2015-07-08 DIAGNOSIS — E119 Type 2 diabetes mellitus without complications: Secondary | ICD-10-CM | POA: Diagnosis not present

## 2015-07-08 DIAGNOSIS — I482 Chronic atrial fibrillation, unspecified: Secondary | ICD-10-CM

## 2015-07-08 DIAGNOSIS — E034 Atrophy of thyroid (acquired): Secondary | ICD-10-CM | POA: Insufficient documentation

## 2015-07-08 LAB — POCT INR: INR: 2.7

## 2015-07-08 MED ORDER — LEVOCETIRIZINE DIHYDROCHLORIDE 5 MG PO TABS: 5.0000 mg | ORAL_TABLET | Freq: Every day | ORAL | Status: DC

## 2015-07-08 MED ORDER — WARFARIN SODIUM 4 MG PO TABS: 8.0000 mg | ORAL_TABLET | Freq: Every day | ORAL | Status: DC

## 2015-07-08 MED ORDER — SPIRONOLACTONE 25 MG PO TABS: 25.0000 mg | ORAL_TABLET | Freq: Every day | ORAL | Status: DC

## 2015-07-08 NOTE — Patient Instructions (Addendum)
[name] , Thank you for taking time to come for your Medicare Wellness Visit. I appreciate your ongoing commitment to your health goals. Please review the following plan we discussed and let me know if I can assist you in the future.   These are the goals we discussed: Goals    Try Chair Exercises daily      This is a list of the screening recommended for you and due dates:  Health Maintenance  Topic Date Due  . Flu Shot  07/26/2015*  . Urine Protein Check  Checked today  .  Hepatitis C: One time screening is recommended by Center for Disease Control  (CDC) for  adults born from [birth year] through 1965.   07/26/2015 - will get with next labs  . Eye exam for diabetics  Due now   . Hemoglobin A1C  08/21/2015  . Pneumonia vaccines (2 of 2 - PPSV23) 10/07/2015  . Complete foot exam   07/07/2016  . Colon Cancer Screening  11/29/2023  . Tetanus Vaccine  02/19/2024  . Shingles Vaccine  Completed  *Topic was postponed. The date shown is not the original due date.    Anticoagulation Dose Instructions as of 07/08/2015      Dorene Grebe Tue Wed Thu Fri Sat   New Dose 8 _0  mg 8 mg    Description        Continue current warfarin dose of 52m - take 1 tablet daily     INR was 2.7 today  Preventive Care for Adults A healthy lifestyle and preventive care can promote health and wellness. Preventive health guidelines for men include the following key practices:  A routine yearly physical is a good way to check with your health care provider about your health and preventative screening. It is a chance to share any concerns and updates on your health and to receive a thorough exam.  Visit your dentist for a routine exam and preventative care every 6 months. Brush your teeth twice a day and floss once a day. Good oral hygiene prevents tooth decay and gum disease.  The frequency of eye exams is based on your age, health, family medical history, use of contact lenses, and other  factors. Follow your health care provider's recommendations for frequency of eye exams.  Eat a healthy diet. Foods such as vegetables, fruits, whole grains, low-fat dairy products, and lean protein foods contain the nutrients you need without too many calories. Decrease your intake of foods high in solid fats, added sugars, and salt. Eat the right amount of calories for you.Get information about a proper diet from your health care provider, if necessary.  Regular physical exercise is one of the most important things you can do for your health. Most adults should get at least 150 minutes of moderate-intensity exercise (any activity that increases your heart rate and causes you to sweat) each week. In addition, most adults need muscle-strengthening exercises on 2 or more days a week.  Maintain a healthy weight. The body mass index (BMI) is a screening tool to identify possible weight problems. It provides an estimate of body fat based on height and weight. Your health care provider can find your BMI and can help you achieve or maintain a healthy weight.For adults 20 years and older:  A BMI below 18.5 is considered underweight.  A BMI of 18.5 to 24.9 is normal.  A BMI of 25 to 29.9 is considered overweight.  A BMI of 30 and above is considered obese.  Maintain normal blood lipids and cholesterol levels by exercising and minimizing your intake of saturated fat. Eat a balanced diet with plenty of fruit and vegetables. Blood tests for lipids and cholesterol should begin at age 29 and be repeated every 5 years. If your lipid or cholesterol levels are high, you are over 50, or you are at high risk for heart disease, you may need your cholesterol levels checked more frequently.Ongoing high lipid and cholesterol levels should be treated with medicines if diet and exercise are not working.  If you smoke, find out from your health care provider how to quit. If you do not use tobacco, do not start.  Lung  cancer screening is recommended for adults aged 51-80 years who are at high risk for developing lung cancer because of a history of smoking. A yearly low-dose CT scan of the lungs is recommended for people who have at least a 30-pack-year history of smoking and are a current smoker or have quit within the past 15 years. A pack year of smoking is smoking an average of 1 pack of cigarettes a day for 1 year (for example: 1 pack a day for 30 years or 2 packs a day for 15 years). Yearly screening should continue until the smoker has stopped smoking for at least 15 years. Yearly screening should be stopped for people who develop a health problem that would prevent them from having lung cancer treatment.  If you choose to drink alcohol, do not have more than 2 drinks per day. One drink is considered to be 12 ounces (355 mL) of beer, 5 ounces (148 mL) of wine, or 1.5 ounces (44 mL) of liquor.  Avoid use of street drugs. Do not share needles with anyone. Ask for help if you need support or instructions about stopping the use of drugs.  High blood pressure causes heart disease and increases the risk of stroke. Your blood pressure should be checked at least every 1-2 years. Ongoing high blood pressure should be treated with medicines, if weight loss and exercise are not effective.  If you are 84-19 years old, ask your health care provider if you should take aspirin to prevent heart disease.  Diabetes screening involves taking a blood sample to check your fasting blood sugar level. This should be done once every 3 years, after age 56, if you are within normal weight and without risk factors for diabetes. Testing should be considered at a younger age or be carried out more frequently if you are overweight and have at least 1 risk factor for diabetes.  Colorectal cancer can be detected and often prevented. Most routine colorectal cancer screening begins at the age of 90 and continues through age 37. However, your  health care provider may recommend screening at an earlier age if you have risk factors for colon cancer. On a yearly basis, your health care provider may provide home test kits to check for hidden blood in the stool. Use of a small camera at the end of a tube to directly examine the colon (sigmoidoscopy or colonoscopy) can detect the earliest forms of colorectal cancer. Talk to your health care provider about this at age 36, when routine screening begins. Direct exam of the colon should be repeated every 5-10 years through age 11, unless early forms of precancerous polyps or small growths are found.  People who are at an increased risk for hepatitis B should be screened for  this virus. You are considered at high risk for hepatitis B if:  You were born in a country where hepatitis B occurs often. Talk with your health care provider about which countries are considered high risk.  Your parents were born in a high-risk country and you have not received a shot to protect against hepatitis B (hepatitis B vaccine).  You have HIV or AIDS.  You use needles to inject street drugs.  You live with, or have sex with, someone who has hepatitis B.  You are a man who has sex with other men (MSM).  You get hemodialysis treatment.  You take certain medicines for conditions such as cancer, organ transplantation, and autoimmune conditions.  Hepatitis C blood testing is recommended for all people born from [birth year] through 1965 and any individual with known risks for hepatitis C.  Practice safe sex. Use condoms and avoid high-risk sexual practices to reduce the spread of sexually transmitted infections (STIs). STIs include gonorrhea, chlamydia, syphilis, trichomonas, herpes, HPV, and human immunodeficiency virus (HIV). Herpes, HIV, and HPV are viral illnesses that have no cure. They can result in disability, cancer, and death.  If you are at risk of being infected with HIV, it is recommended that you take a  prescription medicine daily to prevent HIV infection. This is called preexposure prophylaxis (PrEP). You are considered at risk if:  You are a man who has sex with other men (MSM) and have other risk factors.  You are a heterosexual man, are sexually active, and are at increased risk for HIV infection.  You take drugs by injection.  You are sexually active with a partner who has HIV.  Talk with your health care provider about whether you are at high risk of being infected with HIV. If you choose to begin PrEP, you should first be tested for HIV. You should then be tested every 3 months for as long as you are taking PrEP.  A one-time screening for abdominal aortic aneurysm (AAA) and surgical repair of large AAAs by ultrasound are recommended for men ages 26 to 74 years who are current or former smokers.  Healthy men should no longer receive prostate-specific antigen (PSA) blood tests as part of routine cancer screening. Talk with your health care provider about prostate cancer screening.  Testicular cancer screening is not recommended for adult males who have no symptoms. Screening includes self-exam, a health care provider exam, and other screening tests. Consult with your health care provider about any symptoms you have or any concerns you have about testicular cancer.  Use sunscreen. Apply sunscreen liberally and repeatedly throughout the day. You should seek shade when your shadow is shorter than you. Protect yourself by wearing long sleeves, pants, a wide-brimmed hat, and sunglasses year round, whenever you are outdoors.  Once a month, do a whole-body skin exam, using a mirror to look at the skin on your back. Tell your health care provider about new moles, moles that have irregular borders, moles that are larger than a pencil eraser, or moles that have changed in shape or color.  Stay current with required vaccines (immunizations).  Influenza vaccine. All adults should be immunized every  year.  Tetanus, diphtheria, and acellular pertussis (Td, Tdap) vaccine. An adult who has not previously received Tdap or who does not know his vaccine status should receive 1 dose of Tdap. This initial dose should be followed by tetanus and diphtheria toxoids (Td) booster doses every 10 years. Adults with an unknown or incomplete  history of completing a 3-dose immunization series with Td-containing vaccines should begin or complete a primary immunization series including a Tdap dose. Adults should receive a Td booster every 10 years.  Varicella vaccine. An adult without evidence of immunity to varicella should receive 2 doses or a second dose if he has previously received 1 dose.  Human papillomavirus (HPV) vaccine. Males aged 48-21 years who have not received the vaccine previously should receive the 3-dose series. Males aged 22-26 years may be immunized. Immunization is recommended through the age of 79 years for any male who has sex with males and did not get any or all doses earlier. Immunization is recommended for any person with an immunocompromised condition through the age of 44 years if he did not get any or all doses earlier. During the 3-dose series, the second dose should be obtained 4-8 weeks after the first dose. The third dose should be obtained 24 weeks after the first dose and 16 weeks after the second dose.  Zoster vaccine. One dose is recommended for adults aged 68 years or older unless certain conditions are present.  Measles, mumps, and rubella (MMR) vaccine. Adults born before [birth year] generally are considered immune to measles and mumps. Adults born in [birth year] or later should have 1 or more doses of MMR vaccine unless there is a contraindication to the vaccine or there is laboratory evidence of immunity to each of the three diseases. A routine second dose of MMR vaccine should be obtained at least 28 days after the first dose for students attending postsecondary schools, health care  workers, or international travelers. People who received inactivated measles vaccine or an unknown type of measles vaccine during 1963-1967 should receive 2 doses of MMR vaccine. People who received inactivated mumps vaccine or an unknown type of mumps vaccine before 1979 and are at high risk for mumps infection should consider immunization with 2 doses of MMR vaccine. Unvaccinated health care workers born before 1 who lack laboratory evidence of measles, mumps, or rubella immunity or laboratory confirmation of disease should consider measles and mumps immunization with 2 doses of MMR vaccine or rubella immunization with 1 dose of MMR vaccine.  Pneumococcal 13-valent conjugate (PCV13) vaccine. When indicated, a person who is uncertain of his immunization history and has no record of immunization should receive the PCV13 vaccine. An adult aged 27 years or older who has certain medical conditions and has not been previously immunized should receive 1 dose of PCV13 vaccine. This PCV13 should be followed with a dose of pneumococcal polysaccharide (PPSV23) vaccine. The PPSV23 vaccine dose should be obtained at least 8 weeks after the dose of PCV13 vaccine. An adult aged 16 years or older who has certain medical conditions and previously received 1 or more doses of PPSV23 vaccine should receive 1 dose of PCV13. The PCV13 vaccine dose should be obtained 1 or more years after the last PPSV23 vaccine dose.  Pneumococcal polysaccharide (PPSV23) vaccine. When PCV13 is also indicated, PCV13 should be obtained first. All adults aged 6 years and older should be immunized. An adult younger than age 56 years who has certain medical conditions should be immunized. Any person who resides in a nursing home or long-term care facility should be immunized. An adult smoker should be immunized. People with an immunocompromised condition and certain other conditions should receive both PCV13 and PPSV23 vaccines. People with human  immunodeficiency virus (HIV) infection should be immunized as soon as possible after diagnosis. Immunization during chemotherapy or radiation  therapy should be avoided. Routine use of PPSV23 vaccine is not recommended for American Indians, [place] Natives, or people younger than 65 years unless there are medical conditions that require PPSV23 vaccine. When indicated, people who have unknown immunization and have no record of immunization should receive PPSV23 vaccine. One-time revaccination 5 years after the first dose of PPSV23 is recommended for people aged 19-64 years who have chronic kidney failure, nephrotic syndrome, asplenia, or immunocompromised conditions. People who received 1-2 doses of PPSV23 before age 86 years should receive another dose of PPSV23 vaccine at age 83 years or later if at least 5 years have passed since the previous dose. Doses of PPSV23 are not needed for people immunized with PPSV23 at or after age 35 years.  Meningococcal vaccine. Adults with asplenia or persistent complement component deficiencies should receive 2 doses of quadrivalent meningococcal conjugate (MenACWY-D) vaccine. The doses should be obtained at least 2 months apart. Microbiologists working with certain meningococcal bacteria, [employer] recruits, people at risk during an outbreak, and people who travel to or live in countries with a high rate of meningitis should be immunized. A first-year college student up through age 50 years who is living in a residence hall should receive a dose if he did not receive a dose on or after his 16th birthday. Adults who have certain high-risk conditions should receive one or more doses of vaccine.  Hepatitis A vaccine. Adults who wish to be protected from this disease, have certain high-risk conditions, work with hepatitis A-infected animals, work in hepatitis A research labs, or travel to or work in countries with a high rate of hepatitis A should be immunized. Adults who were  previously unvaccinated and who anticipate close contact with an international adoptee during the first 60 days after arrival in the [place] from a country with a high rate of hepatitis A should be immunized.  Hepatitis B vaccine. Adults should be immunized if they wish to be protected from this disease, have certain high-risk conditions, may be exposed to blood or other infectious body fluids, are household contacts or sex partners of hepatitis B positive people, are clients or workers in certain care facilities, or travel to or work in countries with a high rate of hepatitis B.  Haemophilus influenzae type b (Hib) vaccine. A previously unvaccinated person with asplenia or sickle cell disease or having a scheduled splenectomy should receive 1 dose of Hib vaccine. Regardless of previous immunization, a recipient of a hematopoietic stem cell transplant should receive a 3-dose series 6-12 months after his successful transplant. Hib vaccine is not recommended for adults with HIV infection. Preventive Service / Frequency Ages 51 to 55  Blood pressure check.** / Every 1 to 2 years.  Lipid and cholesterol check.** / Every 5 years beginning at age 44.  Hepatitis C blood test.** / For any individual with known risks for hepatitis C.  Skin self-exam. / Monthly.  Influenza vaccine. / Every year.  Tetanus, diphtheria, and acellular pertussis (Tdap, Td) vaccine.** / Consult your health care provider. 1 dose of Td every 10 years.  Varicella vaccine.** / Consult your health care provider.  HPV vaccine. / 3 doses over 6 months, if 35 or younger.  Measles, mumps, rubella (MMR) vaccine.** / You need at least 1 dose of MMR if you were born in 1957 or later. You may also need a second dose.  Pneumococcal 13-valent conjugate (PCV13) vaccine.** / Consult your health care provider.  Pneumococcal polysaccharide (PPSV23) vaccine.** / 1 to  2 doses if you smoke cigarettes or if you have certain  conditions.  Meningococcal vaccine.** / 1 dose if you are age 32 to 39 years and a Market researcher living in a residence hall, or have one of several medical conditions. You may also need additional booster doses.  Hepatitis A vaccine.** / Consult your health care provider.  Hepatitis B vaccine.** / Consult your health care provider.  Haemophilus influenzae type b (Hib) vaccine.** / Consult your health care provider. Ages 45 to 77  Blood pressure check.** / Every 1 to 2 years.  Lipid and cholesterol check.** / Every 5 years beginning at age 57.  Lung cancer screening. / Every year if you are aged 28-80 years and have a 30-pack-year history of smoking and currently smoke or have quit within the past 15 years. Yearly screening is stopped once you have quit smoking for at least 15 years or develop a health problem that would prevent you from having lung cancer treatment.  Fecal occult blood test (FOBT) of stool. / Every year beginning at age 30 and continuing until age 29. You may not have to do this test if you get a colonoscopy every 10 years.  Flexible sigmoidoscopy** or colonoscopy.** / Every 5 years for a flexible sigmoidoscopy or every 10 years for a colonoscopy beginning at age 41 and continuing until age 69.  Hepatitis C blood test.** / For all people born from [birth year] through 1965 and any individual with known risks for hepatitis C.  Skin self-exam. / Monthly.  Influenza vaccine. / Every year.  Tetanus, diphtheria, and acellular pertussis (Tdap/Td) vaccine.** / Consult your health care provider. 1 dose of Td every 10 years.  Varicella vaccine.** / Consult your health care provider.  Zoster vaccine.** / 1 dose for adults aged 13 years or older.  Measles, mumps, rubella (MMR) vaccine.** / You need at least 1 dose of MMR if you were born in 1957 or later. You may also need a second dose.  Pneumococcal 13-valent conjugate (PCV13) vaccine.** / Consult your health care  provider.  Pneumococcal polysaccharide (PPSV23) vaccine.** / 1 to 2 doses if you smoke cigarettes or if you have certain conditions.  Meningococcal vaccine.** / Consult your health care provider.  Hepatitis A vaccine.** / Consult your health care provider.  Hepatitis B vaccine.** / Consult your health care provider.  Haemophilus influenzae type b (Hib) vaccine.** / Consult your health care provider. Ages 16 and over  Blood pressure check.** / Every 1 to 2 years.  Lipid and cholesterol check.**/ Every 5 years beginning at age 45.  Lung cancer screening. / Every year if you are aged 46-80 years and have a 30-pack-year history of smoking and currently smoke or have quit within the past 15 years. Yearly screening is stopped once you have quit smoking for at least 15 years or develop a health problem that would prevent you from having lung cancer treatment.  Fecal occult blood test (FOBT) of stool. / Every year beginning at age 7 and continuing until age 87. You may not have to do this test if you get a colonoscopy every 10 years.  Flexible sigmoidoscopy** or colonoscopy.** / Every 5 years for a flexible sigmoidoscopy or every 10 years for a colonoscopy beginning at age 50 and continuing until age 39.  Hepatitis C blood test.** / For all people born from [birth year] through 1965 and any individual with known risks for hepatitis C.  Abdominal aortic aneurysm (AAA) screening.** / A one-time screening  for ages 73 to 36 years who are current or former smokers.  Skin self-exam. / Monthly.  Influenza vaccine. / Every year.  Tetanus, diphtheria, and acellular pertussis (Tdap/Td) vaccine.** / 1 dose of Td every 10 years.  Varicella vaccine.** / Consult your health care provider.  Zoster vaccine.** / 1 dose for adults aged 21 years or older.  Pneumococcal 13-valent conjugate (PCV13) vaccine.** / Consult your health care provider.  Pneumococcal polysaccharide (PPSV23) vaccine.** / 1 dose for all  adults aged 39 years and older.  Meningococcal vaccine.** / Consult your health care provider.  Hepatitis A vaccine.** / Consult your health care provider.  Hepatitis B vaccine.** / Consult your health care provider.  Haemophilus influenzae type b (Hib) vaccine.** / Consult your health care provider. **Family history and personal history of risk and conditions may change your health care provider's recommendations. Document Released: 12/19/2001 Document Revised: 10/28/2013 Document Reviewed: 03/20/2011 [facility] Patient Information 2015 [place], [place]. This information is not intended to replace advice given to you by your health care provider. Make sure you discuss any questions you have with your health care provider.

## 2015-07-08 NOTE — Telephone Encounter (Signed)
Just wanted to verify the escitalopram dose of 11m tid?

## 2015-07-08 NOTE — Telephone Encounter (Signed)
He also asked for refills of hydralazine but this was removed from his medication list?

## 2015-07-08 NOTE — Progress Notes (Signed)
Patient ID: Gregg Taylor, male   DOB: 1946-10-04, 69 y.o.   MRN: [id number]   Subjective:   Gregg Taylor is a 69 y.o. male who presents for an Initial Medicare Annual Wellness Visit and to recheck INR / Protime  Patient lives near [place], [place] by himself.  Retired / disabled since 1988 from [employer] in [place].    He does not have any acute medical concerns today.     Current Medications (verified) Outpatient Encounter Prescriptions as of 07/08/2015  Medication Sig  . ALPRAZolam (XANAX) 1 MG tablet Take 1 tablet (1 mg total) by mouth 3 (three) times daily.  . Ascorbic Acid (VITAMIN C) 1000 MG tablet Take 1,000 mg by mouth.  Marland Kitchen aspirin 81 MG chewable tablet Chew 81 mg by mouth daily.   . bumetanide (BUMEX) 2 MG tablet Take 1 tablet by mouth 2 (two) times daily.  . Cholecalciferol (VITAMIN D3) 2000 UNITS TABS Take 2,000 Units by mouth.  . escitalopram (LEXAPRO) 20 MG tablet Take 1 tablet by mouth 3 (three) times daily.   Marland Kitchen gabapentin (NEURONTIN) 300 MG capsule Take 600 mg by mouth 2 (two) times daily.   Marland Kitchen levocetirizine (XYZAL) 5 MG tablet Take 1 tablet by mouth daily.  Marland Kitchen levothyroxine (SYNTHROID, LEVOTHROID) 137 MCG tablet TAKE ONE TABLET BY MOUTH EVERY MORNING  . metoprolol succinate (TOPROL-XL) 25 MG 24 hr tablet Take 1 tablet (25 mg total) by mouth 2 (two) times daily.  . Multiple Vitamin (MULTIVITAMIN) tablet Take 1 tablet by mouth.  . Oxycodone HCl 10 MG TABS Take 1 tablet (10 mg total) by mouth every 6 (six) hours as needed.  . OXYGEN Inhale 2 L into the lungs.  . pantoprazole (PROTONIX) 40 MG tablet Take 1 tablet by mouth 2 (two) times daily.  . potassium chloride (K-DUR) 10 MEQ tablet Take 4 tablets (40 mEq total) by mouth 2 (two) times daily.  . quinapril (ACCUPRIL) 40 MG tablet Take 20 mg by mouth 2 (two) times daily.   Marland Kitchen Respiratory Therapy Supplies MISC CPAP at HS.  . simvastatin (ZOCOR) 20 MG tablet Take 1 tablet by mouth at bedtime.  Marland Kitchen spironolactone  (ALDACTONE) 25 MG tablet Take 1 tablet by mouth daily.  . temazepam (RESTORIL) 30 MG capsule Take 1 capsule by mouth at bedtime.  . vitamin B-12 (CYANOCOBALAMIN) 500 MCG tablet Take 500 mcg by mouth.  . warfarin (COUMADIN) 4 MG tablet Take 2 tablets (8 mg total) by mouth daily. Per anticoagulation clinic directions  . [DISCONTINUED] potassium chloride (K-DUR) 10 MEQ tablet Take 4 tablets (40 mEq total) by mouth daily. (Patient taking differently: Take 40 mEq by mouth 2 (two) times daily. )  . [DISCONTINUED] warfarin (COUMADIN) 3 MG tablet    No facility-administered encounter medications on file as of 07/08/2015.    Allergies (verified) Bystolic; Bupropion; and Celebrex   History: Past Medical History  Diagnosis Date  . Hypertension   . Hyperlipidemia   . Arthritis   . Depression   . CHF (congestive heart failure)   . Cancer     lymph nodes  . Diabetes mellitus without complication    Past Surgical History  Procedure Laterality Date  . Appendectomy    . Cholecystectomy    . Gastric bypass  1991  . Fracture surgery      right wrist   Family History  Problem Relation Age of Onset  . Cancer Mother     ?bladder and bone  . Heart disease Father   .  Hypertension Father   . Depression Father   . Alcohol abuse Father   . Hypertension Brother   . Depression Brother   . Stroke Brother    Social History   Occupational History  . Not on file.   Social History Main Topics  . Smoking status: Former Smoker -- 0.50 packs/day    Types: Cigarettes    Start date: 03/07/1978    Quit date: 03/08/2003  . Smokeless tobacco: Never Used  . Alcohol Use: No  . Drug Use: No  . Sexual Activity: No    Do you feel safe at home?  Yes  Dietary issues and exercise activities discussed: Current Exercise Habits:: Exercise is limited by, Limited by:: orthopedic condition(s);cardiac condition(s)  Patient did just finish with physical therapy for legs.  He states that it helped very little.  Dr Livia Snellen has written Rx for patient to get electric scooter.  Current Dietary habits:  Patient does try to limit portion sizes and CHO intake.   Cardiac Risk Factors include: advanced age (>90mn, >>62women);diabetes mellitus;dyslipidemia;family history of premature cardiovascular disease;hypertension;male gender;obesity (BMI >30kg/m2);sedentary lifestyle  Objective:    Today's Vitals   07/08/15 1047  BP: 112/70  Pulse: 57  Height: _0  (1.727 m)  Weight: 367 lb (166.47 kg)   Body mass index is 55.82 kg/(m^2).   Activities of Daily Living In your present state of health, do you have any difficulty performing the following activities: 07/08/2015  Hearing? Y  Vision? Y  Difficulty concentrating or making decisions? N  Walking or climbing stairs? Y  Dressing or bathing? N  Doing errands, shopping? N  Preparing Food and eating ? N  Using the Toilet? N  In the past six months, have you accidently leaked urine? N  Do you have problems with loss of bowel control? N  Managing your Medications? N  Managing your Finances? N  Housekeeping or managing your Housekeeping? N    Are there smokers in your home (other than you)? No    Depression Screen PHQ 2/9 Scores 07/08/2015 02/19/2015  PHQ - 2 Score 2 6  PHQ- 9 Score 6 18    Fall Risk Fall Risk  07/08/2015 05/28/2015 02/19/2015  Falls in the past year? Yes Yes Yes  Number falls in past yr: 2 or more 2 or more 1  Injury with Fall? No No No  Risk Factor Category  High Fall Risk - -  Risk for fall due to : History of fall(s);Other (Comment);Medication side effect - -  Follow up Falls prevention discussed - -    Cognitive Function: MMSE - Mini Mental State Exam 07/08/2015 07/08/2015  Orientation to time 5 5  Orientation to Place 5 5  Registration 3 3  Attention/ Calculation 5 5  Recall 3 3  Language- name 2 objects 2 2  Language- repeat 1 1  Language- follow 3 step command 3 3  Language- read & follow direction 1 1  Write a  sentence 1 -  Copy design 1 -  Total score 30 -    Immunizations and Health Maintenance  There is no immunization history on file for this patient. There are no preventive care reminders to display for this patient.  Patient Care Team: WClaretta Fraise MD as PCP - General (Family Medicine) LRaj Janus MD as Consulting Physician (Cardiology) MValeda Malm MD as Consulting Physician (Cardiology) ASharol Given MD as Consulting Physician (Otolaryngology) DBenay Spice MD as Physician Assistant (Podiatry) LEulis Canneras  Consulting Physician (Internal Medicine) Dr Viviana Simpler - dermatologist in [place], [place] any recent Medical Services you may have received from other than [facility] providers in the past year (date may be approximate).    Assessment:    Annual Wellness Visit  Therapeutic Anticoagulation   Screening Tests Health Maintenance  Topic Date Due  . INFLUENZA VACCINE  07/26/2015 (Originally 06/07/2015)  . URINE MICROALBUMIN  07/26/2015 (Originally 03/02/1956)  . Hepatitis C Screening  07/26/2015 (Originally [birth year])  . OPHTHALMOLOGY EXAM  08/25/2015 (Originally 09/07/2014)  . HEMOGLOBIN A1C  08/21/2015  . PNA vac Low Risk Adult (2 of 2 - PPSV23) 10/07/2015  . FOOT EXAM  07/07/2016  . COLONOSCOPY  11/29/2023  . TETANUS/TDAP  02/19/2024  . ZOSTAVAX  Completed      Plan:   During the course of the visit Gregg Taylor was educated and counseled about the following appropriate screening and preventive services:   Vaccines to include Pneumoccal, Influenza, Hepatitis B, Td, Zostavax - checked [facility] - not records but patient believes he had vaccines at last PCP.  Will review records from that office.  Reminded about influenza vaccines in next 1 to 2 months.  Colorectal cancer screening - FOBT given in office for patient to return  Cardiovascular disease screening - ECHO and EKG UTD;  Last lipid panel look great and BP was at goal today  Foot exam performed  today  Diabetes vs. Pre diabetes - patient has past history of prednisone induced diabetes.  A1c has been less than 6.0% for last 2 checks  Urine micro albumin drawn today - pending  Glaucoma screening / Diabetic Eye Exam - reminded patient that this due.   Nutrition counseling - reviewed CHO counting and portion recommendations  Prostate cancer screening- to get with next labs .handout given and discussed doing chair exercises  Anticoagulation Dose Instructions as of 07/08/2015      Dorene Grebe Tue Wed Thu Fri Sat   New Dose 8 _0  mg 8 mg    Description        Continue current warfarin dose of 38m - take 1 tablet daily     RTC in 1 month to recheck INR.  Patient Instructions (the written plan) were given to the patient.   [facility]re Robins [facility]  07/08/2015

## 2015-07-09 ENCOUNTER — Telehealth: Payer: Self-pay | Admitting: Pharmacist

## 2015-07-09 LAB — MICROALBUMIN / CREATININE URINE RATIO
CREATININE, UR: 43.6 mg/dL
MICROALB/CREAT RATIO: <6.9 mg/g creat (ref 0.0–30.0)
Microalbumin, Urine: <3 ug/mL

## 2015-07-09 MED ORDER — ESCITALOPRAM OXALATE 20 MG PO TABS: 20.0000 mg | ORAL_TABLET | Freq: Two times a day (BID) | ORAL | Status: DC

## 2015-07-09 MED ORDER — HYDRALAZINE HCL 25 MG PO TABS: 50.0000 mg | ORAL_TABLET | Freq: Three times a day (TID) | ORAL | Status: DC

## 2015-07-09 NOTE — Telephone Encounter (Signed)
Patient reports he has been taking hydralazine regularly.  Rx sent into his pharmacy.  He also did not understand what the nurse was trying to tell him about his pain medication.  Per Dr Livia Snellen Gregg Taylor will have to make appt with him before this rx runs out 07/28/2015. Will forward to Dr Livia Snellen nurse to call patient and make appt.  Patient also asked about electric scooter.  He states that the company he is trying to get scooter from has not received paperwork yet.  He thinks that Gregg Taylor knows about this but she in not here today.  I will forward to her to follow up on as well as to Gregg Taylor who usually takes care of home health related equipment.

## 2015-07-09 NOTE — Telephone Encounter (Signed)
Escitalopram i should be BID. I sent in dose. His BP is good. If taking Hydralazine, continue. If he has been out for a week or more, he doesn't need it. Nurse please contact pt to find out.

## 2015-07-14 ENCOUNTER — Telehealth: Payer: Self-pay | Admitting: Family Medicine

## 2015-07-15 NOTE — Telephone Encounter (Signed)
All forms and notes faxed, pt aware

## 2015-07-15 NOTE — Telephone Encounter (Signed)
Patient aware that I spoke with Barnett Applebaum and she is working on his paper work.

## 2015-07-15 NOTE — Telephone Encounter (Signed)
Form and ov notes faxed today. Pt aware.

## 2015-07-22 ENCOUNTER — Telehealth: Payer: Self-pay | Admitting: Family Medicine

## 2015-07-27 ENCOUNTER — Telehealth: Payer: Self-pay | Admitting: *Deleted

## 2015-07-27 NOTE — Telephone Encounter (Signed)
I need the notes updated asap, please remind Dr. Livia Snellen today! We dont want to have to start over

## 2015-07-27 NOTE — Telephone Encounter (Signed)
I updated my note from 8/19. Hopefully it is sufficient. Thanks, WS

## 2015-07-27 NOTE — Telephone Encounter (Signed)
Please add notes for motorized scooter

## 2015-07-28 NOTE — Telephone Encounter (Signed)
Please review

## 2015-07-28 NOTE — Telephone Encounter (Signed)
Sent new notes

## 2015-07-29 ENCOUNTER — Other Ambulatory Visit: Payer: Self-pay | Admitting: Family Medicine

## 2015-07-30 ENCOUNTER — Telehealth: Payer: Self-pay | Admitting: Family Medicine

## 2015-07-30 NOTE — Telephone Encounter (Signed)
He has to be seen for that

## 2015-07-30 NOTE — Telephone Encounter (Signed)
Patient aware that all forms have been faxed and that completes everything on our end.

## 2015-08-02 NOTE — Progress Notes (Signed)
Subjective:  Patient ID: Gregg Taylor, male    DOB: 1946/09/15  Age: 69 y.o. MRN: [id number]  CC: Pain and Sleep Apnea   HPI Gregg Taylor presents for  follow-up of hypertension. Patient has no history of headache chest pain or shortness of breath or recent cough. Patient also denies symptoms of TIA such as numbness weakness lateralizing. Patient checks  blood pressure at home and has not had any elevated readings recently. Patient denies side effects from his medication. States taking it regularly.  Patient also  in for follow-up of elevated cholesterol. Doing well without complaints on current medication. Denies side effects of statin including myalgia and arthralgia and nausea. Also in today for liver function testing. Currently no chest pain, shortness of breath or other cardiovascular related symptoms noted.  Follow-up of diabetes. Patient does not check blood sugar at home. Patient denies symptoms such as polyuria, polydipsia, excessive hunger, nausea No significant hypoglycemic spells noted. Medications as noted below. Taking them regularly without complication/adverse reaction being reported today.   Missed two doses of coumadin.    History Gregg Taylor has a past medical history of Hypertension; Hyperlipidemia; Arthritis; Depression; CHF (congestive heart failure); Cancer; and Diabetes mellitus without complication.   He has past surgical history that includes Appendectomy; Cholecystectomy; Gastric bypass (1991); and Fracture surgery.   His family history includes Alcohol abuse in his father; Cancer in his mother; Depression in his brother and father; Heart disease in his father; Hypertension in his brother and father; Stroke in his brother.He reports that he quit smoking about 12 years ago. His smoking use included Cigarettes. He started smoking about 37 years ago. He smoked 0.50 packs per day. He has never used smokeless tobacco. He reports that he does not drink alcohol or use illicit  drugs.  Current Outpatient Prescriptions on File Prior to Visit  Medication Sig Dispense Refill  . ALPRAZolam (XANAX) 1 MG tablet Take 1 tablet (1 mg total) by mouth 3 (three) times daily. 90 tablet 5  . Ascorbic Acid (VITAMIN C) 1000 MG tablet Take 1,000 mg by mouth.    Marland Kitchen aspirin 81 MG chewable tablet Chew 81 mg by mouth daily.     . bumetanide (BUMEX) 2 MG tablet Take 1 tablet by mouth 2 (two) times daily.  6  . Cholecalciferol (VITAMIN D3) 2000 UNITS TABS Take 2,000 Units by mouth.    . escitalopram (LEXAPRO) 20 MG tablet Take 1 tablet (20 mg total) by mouth 2 (two) times daily. 60 tablet 2  . gabapentin (NEURONTIN) 300 MG capsule Take 600 mg by mouth 2 (two) times daily.   1  . hydrALAZINE (APRESOLINE) 25 MG tablet Take 2 tablets (50 mg total) by mouth 3 (three) times daily. 180 tablet 2  . levocetirizine (XYZAL) 5 MG tablet Take 1 tablet (5 mg total) by mouth daily. 90 tablet 1  . levothyroxine (SYNTHROID, LEVOTHROID) 137 MCG tablet TAKE ONE TABLET BY MOUTH EVERY MORNING 90 tablet 2  . metoprolol succinate (TOPROL-XL) 25 MG 24 hr tablet Take 1 tablet (25 mg total) by mouth 2 (two) times daily. 60 tablet 5  . Multiple Vitamin (MULTIVITAMIN) tablet Take 1 tablet by mouth.    . OXYGEN Inhale 2 L into the lungs.    . pantoprazole (PROTONIX) 40 MG tablet Take 1 tablet by mouth 2 (two) times daily.  1  . potassium chloride (K-DUR) 10 MEQ tablet Take 4 tablets (40 mEq total) by mouth 2 (two) times daily. 120 tablet   .  quinapril (ACCUPRIL) 40 MG tablet Take 20 mg by mouth 2 (two) times daily.   0  . Respiratory Therapy Supplies MISC CPAP at HS.    . simvastatin (ZOCOR) 20 MG tablet Take 1 tablet by mouth at bedtime.  1  . spironolactone (ALDACTONE) 25 MG tablet Take 1 tablet (25 mg total) by mouth daily. 90 tablet 1  . vitamin B-12 (CYANOCOBALAMIN) 500 MCG tablet Take 500 mcg by mouth.    . warfarin (COUMADIN) 4 MG tablet Take 2 tablets (8 mg total) by mouth daily. Per anticoagulation clinic  directions 180 tablet 1   No current facility-administered medications on file prior to visit.    ROS Review of Systems  Constitutional: Negative for fever, chills and diaphoresis.  HENT: Negative for congestion, rhinorrhea and sore throat.   Respiratory: Negative for cough, shortness of breath and wheezing.   Cardiovascular: Negative for chest pain.  Gastrointestinal: Negative for nausea, vomiting, abdominal pain, diarrhea, constipation and abdominal distention.  Genitourinary: Negative for dysuria and frequency.  Musculoskeletal: Negative for joint swelling and arthralgias.  Skin: Negative for rash.  Neurological: Negative for headaches.    Objective:  Ht _0  (1.727 m)  Wt 380 lb 9.6 oz (172.639 kg)  BMI 57.88 kg/m2  BP Readings from Last 3 Encounters:  07/08/15 112/70  06/11/15 109/63  05/28/15 118/56    Wt Readings from Last 3 Encounters:  08/03/15 380 lb 9.6 oz (172.639 kg)  07/08/15 367 lb (166.47 kg)  06/25/15 367 lb 6.4 oz (166.652 kg)     Physical Exam  Constitutional: He is oriented to person, place, and time. He appears well-developed and well-nourished. No distress.  HENT:  Head: Normocephalic and atraumatic.  Right Ear: External ear normal.  Left Ear: External ear normal.  Nose: Nose normal.  Mouth/Throat: Oropharynx is clear and moist.  Eyes: Conjunctivae and EOM are normal. Pupils are equal, round, and reactive to light.  Neck: Normal range of motion. Neck supple. No thyromegaly present.  Cardiovascular: Normal rate, regular rhythm and normal heart sounds.   No murmur heard. Pulmonary/Chest: Effort normal and breath sounds normal. No respiratory distress. He has no wheezes. He has no rales.  Abdominal: Soft. Bowel sounds are normal. He exhibits no distension. There is no tenderness.  Lymphadenopathy:    He has no cervical adenopathy.  Neurological: He is alert and oriented to person, place, and time. He has normal reflexes.  Skin: Skin is warm and  dry.  Psychiatric: He has a normal mood and affect. His behavior is normal. Judgment and thought content normal.    Lab Results  Component Value Date   HGBA1C 5.8 08/03/2015   HGBA1C 5.9 02/19/2015    Lab Results  Component Value Date   WBC 7.4 05/28/2015   HGB 11.9* 05/28/2015   HCT 37.2* 05/28/2015   GLUCOSE 100* 06/25/2015   CHOL 122 05/28/2015   TRIG 53 05/28/2015   HDL 74 05/28/2015   LDLCALC 37 05/28/2015   ALT 18 06/25/2015   AST 19 06/25/2015   NA 145* 06/25/2015   K 5.1 06/25/2015   CL 100 06/25/2015   CREATININE 1.02 06/25/2015   BUN 23 06/25/2015   CO2 30* 06/25/2015   TSH 0.890 05/28/2015   INR 1.7 08/03/2015   HGBA1C 5.8 08/03/2015    Patient was never admitted.  Assessment & Plan:   Gregg Taylor was seen today for pain and sleep apnea.  Diagnoses and all orders for this visit:  Congestive dilated cardiomyopathy  Type  2 diabetes mellitus, uncontrolled, with neuropathy -     POCT glycosylated hemoglobin (Hb A1C)  Morbid obesity  Intervertebral lumbar disc disorder with myelopathy, lumbar region  Chronic atrial fibrillation -     POCT INR  Hypothyroidism due to acquired atrophy of thyroid  Dyspnea  Encounter for immunization  Other orders -     Oxycodone HCl 10 MG TABS; Take 1 tablet (10 mg total) by mouth every 6 (six) hours as needed. -     Oxycodone HCl 10 MG TABS; Take 1 tablet (10 mg total) by mouth 4 (four) times daily as needed (for severe pain only). -     Oxycodone HCl 10 MG TABS; Take 1 tablet (10 mg total) by mouth 4 (four) times daily. -     temazepam (RESTORIL) 30 MG capsule; Take 1 capsule (30 mg total) by mouth at bedtime. -     blood glucose meter kit and supplies KIT; Dispense based on patient and insurance preference. Use up to four times daily as directed. (FOR ICD-9 250.00, 250.01). -     Flu Vaccine QUAD 36+ mos IM   I have changed Gregg Taylor's temazepam. I am also having him start on Oxycodone HCl, Oxycodone HCl, and blood  glucose meter kit and supplies. Additionally, I am having him maintain his bumetanide, gabapentin, pantoprazole, quinapril, simvastatin, ALPRAZolam, vitamin C, aspirin, Vitamin D3, multivitamin, OXYGEN, Respiratory Therapy Supplies, vitamin B-12, metoprolol succinate, levothyroxine, potassium chloride, levocetirizine, spironolactone, warfarin, escitalopram, hydrALAZINE, and Oxycodone HCl.  Meds ordered this encounter  Medications  . Oxycodone HCl 10 MG TABS    Sig: Take 1 tablet (10 mg total) by mouth every 6 (six) hours as needed.    Dispense:  120 tablet    Refill:  0  . Oxycodone HCl 10 MG TABS    Sig: Take 1 tablet (10 mg total) by mouth 4 (four) times daily as needed (for severe pain only).    Dispense:  120 tablet    Refill:  0    Do not fill until September 02, 2015  . Oxycodone HCl 10 MG TABS    Sig: Take 1 tablet (10 mg total) by mouth 4 (four) times daily.    Dispense:  120 tablet    Refill:  0    Do not fill until November 26  . temazepam (RESTORIL) 30 MG capsule    Sig: Take 1 capsule (30 mg total) by mouth at bedtime.    Dispense:  30 capsule    Refill:  0  . blood glucose meter kit and supplies KIT    Sig: Dispense based on patient and insurance preference. Use up to four times daily as directed. (FOR ICD-9 250.00, 250.01).    Dispense:  1 each    Refill:  11    Order Specific Question:  Number of strips    Answer:  100    Order Specific Question:  Number of lancets    Answer:  100     Follow-up: Return in about 1 month (around 09/02/2015).  Claretta Fraise, M.D.

## 2015-08-02 NOTE — Telephone Encounter (Signed)
appt scheduled

## 2015-08-03 ENCOUNTER — Ambulatory Visit (INDEPENDENT_AMBULATORY_CARE_PROVIDER_SITE_OTHER): Payer: Medicare Other | Admitting: Family Medicine

## 2015-08-03 ENCOUNTER — Encounter: Payer: Self-pay | Admitting: Family Medicine

## 2015-08-03 VITALS — Ht 68.0 in | Wt 380.6 lb

## 2015-08-03 DIAGNOSIS — E034 Atrophy of thyroid (acquired): Secondary | ICD-10-CM | POA: Diagnosis not present

## 2015-08-03 DIAGNOSIS — Z23 Encounter for immunization: Secondary | ICD-10-CM

## 2015-08-03 DIAGNOSIS — I482 Chronic atrial fibrillation, unspecified: Secondary | ICD-10-CM

## 2015-08-03 DIAGNOSIS — E1140 Type 2 diabetes mellitus with diabetic neuropathy, unspecified: Secondary | ICD-10-CM | POA: Diagnosis not present

## 2015-08-03 DIAGNOSIS — E1165 Type 2 diabetes mellitus with hyperglycemia: Secondary | ICD-10-CM | POA: Diagnosis not present

## 2015-08-03 DIAGNOSIS — R0600 Dyspnea, unspecified: Secondary | ICD-10-CM | POA: Diagnosis not present

## 2015-08-03 DIAGNOSIS — IMO0002 Reserved for concepts with insufficient information to code with codable children: Secondary | ICD-10-CM

## 2015-08-03 DIAGNOSIS — M5106 Intervertebral disc disorders with myelopathy, lumbar region: Secondary | ICD-10-CM

## 2015-08-03 DIAGNOSIS — E038 Other specified hypothyroidism: Secondary | ICD-10-CM

## 2015-08-03 DIAGNOSIS — I420 Dilated cardiomyopathy: Secondary | ICD-10-CM | POA: Diagnosis not present

## 2015-08-03 LAB — POCT GLYCOSYLATED HEMOGLOBIN (HGB A1C): HEMOGLOBIN A1C: 5.8

## 2015-08-03 LAB — POCT INR: INR: 1.7

## 2015-08-03 MED ORDER — OXYCODONE HCL 10 MG PO TABS: 10.0000 mg | ORAL_TABLET | Freq: Four times a day (QID) | ORAL | Status: DC | PRN

## 2015-08-03 MED ORDER — OXYCODONE HCL 10 MG PO TABS: 10.0000 mg | ORAL_TABLET | Freq: Four times a day (QID) | ORAL | Status: DC

## 2015-08-03 MED ORDER — TEMAZEPAM 30 MG PO CAPS: 30.0000 mg | ORAL_CAPSULE | Freq: Every day | ORAL | Status: DC

## 2015-08-03 MED ORDER — BLOOD GLUCOSE MONITOR KIT: PACK | Status: AC

## 2015-08-10 ENCOUNTER — Encounter: Payer: Self-pay | Admitting: Pharmacist Clinician (PhC)/ Clinical Pharmacy Specialist

## 2015-08-18 DIAGNOSIS — E119 Type 2 diabetes mellitus without complications: Secondary | ICD-10-CM | POA: Diagnosis not present

## 2015-08-18 DIAGNOSIS — R600 Localized edema: Secondary | ICD-10-CM | POA: Diagnosis not present

## 2015-08-18 DIAGNOSIS — R300 Dysuria: Secondary | ICD-10-CM | POA: Diagnosis not present

## 2015-08-18 DIAGNOSIS — Z79891 Long term (current) use of opiate analgesic: Secondary | ICD-10-CM | POA: Diagnosis not present

## 2015-08-18 DIAGNOSIS — Z86010 Personal history of colonic polyps: Secondary | ICD-10-CM | POA: Diagnosis not present

## 2015-08-18 DIAGNOSIS — R1310 Dysphagia, unspecified: Secondary | ICD-10-CM | POA: Diagnosis not present

## 2015-08-18 DIAGNOSIS — E039 Hypothyroidism, unspecified: Secondary | ICD-10-CM | POA: Diagnosis not present

## 2015-08-18 DIAGNOSIS — J029 Acute pharyngitis, unspecified: Secondary | ICD-10-CM | POA: Diagnosis not present

## 2015-08-18 DIAGNOSIS — R0602 Shortness of breath: Secondary | ICD-10-CM | POA: Diagnosis not present

## 2015-09-02 ENCOUNTER — Ambulatory Visit (INDEPENDENT_AMBULATORY_CARE_PROVIDER_SITE_OTHER): Payer: Medicare Other | Admitting: Pharmacist

## 2015-09-02 ENCOUNTER — Telehealth: Payer: Self-pay | Admitting: Pharmacist

## 2015-09-02 DIAGNOSIS — I482 Chronic atrial fibrillation, unspecified: Secondary | ICD-10-CM

## 2015-09-02 DIAGNOSIS — E1159 Type 2 diabetes mellitus with other circulatory complications: Secondary | ICD-10-CM

## 2015-09-02 DIAGNOSIS — R0600 Dyspnea, unspecified: Secondary | ICD-10-CM | POA: Diagnosis not present

## 2015-09-02 DIAGNOSIS — R609 Edema, unspecified: Secondary | ICD-10-CM

## 2015-09-02 LAB — POCT INR: INR: 4.8

## 2015-09-02 NOTE — Telephone Encounter (Signed)
I ordered the labs for future to be drawn by a lab. He needs to wait until December 27 to have them drawn. He needs to reschedule with me for the 28th or 29th. Currently his appointment is on the 16th. His hemoglobin A1c would be too early at that time. Please check with the lab to see about having him drawn at a lab [place] near [place] or I believe he could be sent to [facility] in [place] as well. Thanks, WS.

## 2015-09-02 NOTE — Telephone Encounter (Signed)
Please review and advise

## 2015-09-02 NOTE — Patient Instructions (Signed)
Anticoagulation Dose Instructions as of 09/02/2015      Gregg Taylor Tue Wed Thu Fri Sat   New Dose 4 _0  mg 8 mg    Description        No warfarin today - Thursday, October 27th, take 1 tablet tomorrow - Friday, October 28th.  Then decrease warfarin dose to 1 tablet = 52m on Sundays and take 2 tablets all other days (= 828m.     INR was 4.8 today

## 2015-09-03 NOTE — Telephone Encounter (Signed)
Please refer to the note I wrote yesterday. It should be found below. Thanks, WS.

## 2015-09-08 DIAGNOSIS — K5730 Diverticulosis of large intestine without perforation or abscess without bleeding: Secondary | ICD-10-CM | POA: Diagnosis not present

## 2015-09-10 ENCOUNTER — Telehealth: Payer: Self-pay | Admitting: Family Medicine

## 2015-09-13 NOTE — Telephone Encounter (Signed)
This was faxed on 09/10/15

## 2015-09-21 DIAGNOSIS — M9903 Segmental and somatic dysfunction of lumbar region: Secondary | ICD-10-CM | POA: Diagnosis not present

## 2015-09-21 DIAGNOSIS — M5441 Lumbago with sciatica, right side: Secondary | ICD-10-CM | POA: Diagnosis not present

## 2015-09-21 DIAGNOSIS — M9901 Segmental and somatic dysfunction of cervical region: Secondary | ICD-10-CM | POA: Diagnosis not present

## 2015-09-21 DIAGNOSIS — M9904 Segmental and somatic dysfunction of sacral region: Secondary | ICD-10-CM | POA: Diagnosis not present

## 2015-09-21 DIAGNOSIS — M5137 Other intervertebral disc degeneration, lumbosacral region: Secondary | ICD-10-CM | POA: Diagnosis not present

## 2015-09-22 ENCOUNTER — Ambulatory Visit (INDEPENDENT_AMBULATORY_CARE_PROVIDER_SITE_OTHER): Payer: Medicare Other | Admitting: Pharmacist

## 2015-09-22 DIAGNOSIS — I482 Chronic atrial fibrillation, unspecified: Secondary | ICD-10-CM

## 2015-09-22 DIAGNOSIS — R791 Abnormal coagulation profile: Secondary | ICD-10-CM

## 2015-09-22 DIAGNOSIS — R0600 Dyspnea, unspecified: Secondary | ICD-10-CM

## 2015-09-22 LAB — POCT HEMOGLOBIN: HEMOGLOBIN: 11.5 g/dL — AB (ref 14.1–18.1)

## 2015-09-22 LAB — POCT INR: INR: 6.3

## 2015-09-22 NOTE — Progress Notes (Signed)
Order set up for patient to have INR checked at [facility] since it is closer to home.  Patient to have checked 09/25/2015. HBG was 11.5% today (stable)

## 2015-09-22 NOTE — Patient Instructions (Signed)
Anticoagulation Dose Instructions as of 09/22/2015      Gregg Taylor Tue Wed Thu Fri Sat   New Dose 8 mg 4 mg 8 mg 4 mg 8 mg 4 mg 8 mg    Description        No warfarin today or tomorrow - 09/22/15 and 09/23/2015.  Then decrease warfarin dose to  take 1 tablet mondays, wednesdays and fridays.  Take 2 tablets on sundays, tuesdays, thursdays and saturdays.       INR was 6.3 today   Go to [facility] for INR next Monday, November 19th.  Also go there for labs for Dr Quinn Axe in December  [address] [place], [place], [place] [address]  [id number]:[phone]

## 2015-09-23 ENCOUNTER — Other Ambulatory Visit: Payer: Self-pay | Admitting: Family Medicine

## 2015-09-23 DIAGNOSIS — M5441 Lumbago with sciatica, right side: Secondary | ICD-10-CM | POA: Diagnosis not present

## 2015-09-23 DIAGNOSIS — M9901 Segmental and somatic dysfunction of cervical region: Secondary | ICD-10-CM | POA: Diagnosis not present

## 2015-09-23 DIAGNOSIS — M9903 Segmental and somatic dysfunction of lumbar region: Secondary | ICD-10-CM | POA: Diagnosis not present

## 2015-09-23 DIAGNOSIS — M9904 Segmental and somatic dysfunction of sacral region: Secondary | ICD-10-CM | POA: Diagnosis not present

## 2015-09-23 DIAGNOSIS — M5137 Other intervertebral disc degeneration, lumbosacral region: Secondary | ICD-10-CM | POA: Diagnosis not present

## 2015-09-27 ENCOUNTER — Ambulatory Visit: Payer: Self-pay | Admitting: Family Medicine

## 2015-09-27 ENCOUNTER — Other Ambulatory Visit: Payer: Self-pay | Admitting: Pharmacist

## 2015-09-27 DIAGNOSIS — R0600 Dyspnea, unspecified: Secondary | ICD-10-CM

## 2015-09-27 DIAGNOSIS — E1159 Type 2 diabetes mellitus with other circulatory complications: Secondary | ICD-10-CM

## 2015-09-27 DIAGNOSIS — R791 Abnormal coagulation profile: Secondary | ICD-10-CM | POA: Diagnosis not present

## 2015-09-27 DIAGNOSIS — I420 Dilated cardiomyopathy: Secondary | ICD-10-CM

## 2015-09-27 DIAGNOSIS — M9901 Segmental and somatic dysfunction of cervical region: Secondary | ICD-10-CM | POA: Diagnosis not present

## 2015-09-27 DIAGNOSIS — M9903 Segmental and somatic dysfunction of lumbar region: Secondary | ICD-10-CM | POA: Diagnosis not present

## 2015-09-27 DIAGNOSIS — M5441 Lumbago with sciatica, right side: Secondary | ICD-10-CM | POA: Diagnosis not present

## 2015-09-27 DIAGNOSIS — I482 Chronic atrial fibrillation, unspecified: Secondary | ICD-10-CM

## 2015-09-27 DIAGNOSIS — M9904 Segmental and somatic dysfunction of sacral region: Secondary | ICD-10-CM | POA: Diagnosis not present

## 2015-09-27 DIAGNOSIS — M5137 Other intervertebral disc degeneration, lumbosacral region: Secondary | ICD-10-CM | POA: Diagnosis not present

## 2015-09-27 LAB — POCT INR: INR: 1.96

## 2015-09-29 ENCOUNTER — Other Ambulatory Visit: Payer: Self-pay | Admitting: Pharmacist

## 2015-09-29 ENCOUNTER — Ambulatory Visit (INDEPENDENT_AMBULATORY_CARE_PROVIDER_SITE_OTHER): Payer: Medicare Other | Admitting: Pharmacist

## 2015-09-29 ENCOUNTER — Encounter: Payer: Self-pay | Admitting: Pharmacist

## 2015-09-29 ENCOUNTER — Telehealth: Payer: Self-pay | Admitting: Pharmacist

## 2015-09-29 DIAGNOSIS — M9901 Segmental and somatic dysfunction of cervical region: Secondary | ICD-10-CM | POA: Diagnosis not present

## 2015-09-29 DIAGNOSIS — R791 Abnormal coagulation profile: Secondary | ICD-10-CM | POA: Diagnosis not present

## 2015-09-29 DIAGNOSIS — M5441 Lumbago with sciatica, right side: Secondary | ICD-10-CM | POA: Diagnosis not present

## 2015-09-29 DIAGNOSIS — E1159 Type 2 diabetes mellitus with other circulatory complications: Secondary | ICD-10-CM | POA: Diagnosis not present

## 2015-09-29 DIAGNOSIS — M9904 Segmental and somatic dysfunction of sacral region: Secondary | ICD-10-CM | POA: Diagnosis not present

## 2015-09-29 DIAGNOSIS — I482 Chronic atrial fibrillation, unspecified: Secondary | ICD-10-CM

## 2015-09-29 DIAGNOSIS — M9903 Segmental and somatic dysfunction of lumbar region: Secondary | ICD-10-CM | POA: Diagnosis not present

## 2015-09-29 DIAGNOSIS — M5137 Other intervertebral disc degeneration, lumbosacral region: Secondary | ICD-10-CM | POA: Diagnosis not present

## 2015-09-29 DIAGNOSIS — R0600 Dyspnea, unspecified: Secondary | ICD-10-CM | POA: Diagnosis not present

## 2015-09-29 NOTE — Progress Notes (Signed)
INR checked by [facility] - no charge for visit.  Patient called and notified of result and recommended dose.

## 2015-09-29 NOTE — Telephone Encounter (Signed)
Labs were drawn through [facility]. Our lab is requesting results and will contact patient.

## 2015-09-29 NOTE — Telephone Encounter (Signed)
INR was 1.96.  Patient was notified of results and dose recommendations - see anticoagulation visit.

## 2015-10-05 DIAGNOSIS — M9901 Segmental and somatic dysfunction of cervical region: Secondary | ICD-10-CM | POA: Diagnosis not present

## 2015-10-05 DIAGNOSIS — M9903 Segmental and somatic dysfunction of lumbar region: Secondary | ICD-10-CM | POA: Diagnosis not present

## 2015-10-05 DIAGNOSIS — M5137 Other intervertebral disc degeneration, lumbosacral region: Secondary | ICD-10-CM | POA: Diagnosis not present

## 2015-10-05 DIAGNOSIS — M9904 Segmental and somatic dysfunction of sacral region: Secondary | ICD-10-CM | POA: Diagnosis not present

## 2015-10-05 DIAGNOSIS — M5441 Lumbago with sciatica, right side: Secondary | ICD-10-CM | POA: Diagnosis not present

## 2015-10-07 DIAGNOSIS — M9903 Segmental and somatic dysfunction of lumbar region: Secondary | ICD-10-CM | POA: Diagnosis not present

## 2015-10-07 DIAGNOSIS — M5441 Lumbago with sciatica, right side: Secondary | ICD-10-CM | POA: Diagnosis not present

## 2015-10-07 DIAGNOSIS — M5137 Other intervertebral disc degeneration, lumbosacral region: Secondary | ICD-10-CM | POA: Diagnosis not present

## 2015-10-07 DIAGNOSIS — M9901 Segmental and somatic dysfunction of cervical region: Secondary | ICD-10-CM | POA: Diagnosis not present

## 2015-10-07 DIAGNOSIS — M9904 Segmental and somatic dysfunction of sacral region: Secondary | ICD-10-CM | POA: Diagnosis not present

## 2015-10-11 DIAGNOSIS — M9901 Segmental and somatic dysfunction of cervical region: Secondary | ICD-10-CM | POA: Diagnosis not present

## 2015-10-11 DIAGNOSIS — M25511 Pain in right shoulder: Secondary | ICD-10-CM | POA: Diagnosis not present

## 2015-10-11 DIAGNOSIS — M25562 Pain in left knee: Secondary | ICD-10-CM | POA: Diagnosis not present

## 2015-10-11 DIAGNOSIS — M9904 Segmental and somatic dysfunction of sacral region: Secondary | ICD-10-CM | POA: Diagnosis not present

## 2015-10-11 DIAGNOSIS — M5441 Lumbago with sciatica, right side: Secondary | ICD-10-CM | POA: Diagnosis not present

## 2015-10-11 DIAGNOSIS — M5137 Other intervertebral disc degeneration, lumbosacral region: Secondary | ICD-10-CM | POA: Diagnosis not present

## 2015-10-11 DIAGNOSIS — M9903 Segmental and somatic dysfunction of lumbar region: Secondary | ICD-10-CM | POA: Diagnosis not present

## 2015-10-11 DIAGNOSIS — M25561 Pain in right knee: Secondary | ICD-10-CM | POA: Diagnosis not present

## 2015-10-14 DIAGNOSIS — M9903 Segmental and somatic dysfunction of lumbar region: Secondary | ICD-10-CM | POA: Diagnosis not present

## 2015-10-14 DIAGNOSIS — M9904 Segmental and somatic dysfunction of sacral region: Secondary | ICD-10-CM | POA: Diagnosis not present

## 2015-10-14 DIAGNOSIS — M5137 Other intervertebral disc degeneration, lumbosacral region: Secondary | ICD-10-CM | POA: Diagnosis not present

## 2015-10-14 DIAGNOSIS — M5441 Lumbago with sciatica, right side: Secondary | ICD-10-CM | POA: Diagnosis not present

## 2015-10-14 DIAGNOSIS — M9901 Segmental and somatic dysfunction of cervical region: Secondary | ICD-10-CM | POA: Diagnosis not present

## 2015-10-14 NOTE — Addendum Note (Signed)
Addended by: Earlene Plater on: 10/14/2015 11:46 AM   Modules accepted: Orders

## 2015-10-14 NOTE — Addendum Note (Signed)
Addended by: Earlene Plater on: 10/14/2015 11:45 AM   Modules accepted: Orders

## 2015-10-14 NOTE — Addendum Note (Signed)
Addended by: Earlene Plater on: 10/14/2015 11:47 AM   Modules accepted: Orders

## 2015-10-15 ENCOUNTER — Ambulatory Visit (INDEPENDENT_AMBULATORY_CARE_PROVIDER_SITE_OTHER): Payer: Medicare Other | Admitting: Pharmacist

## 2015-10-15 ENCOUNTER — Telehealth: Payer: Self-pay | Admitting: Family Medicine

## 2015-10-15 DIAGNOSIS — I482 Chronic atrial fibrillation, unspecified: Secondary | ICD-10-CM

## 2015-10-15 LAB — PROTIME-INR
INR: 2.76 — ABNORMAL HIGH (ref <1.50)
Prothrombin Time: 29.6 seconds — ABNORMAL HIGH (ref 11.6–15.2)

## 2015-10-15 NOTE — Progress Notes (Signed)
INR was checked at [facility].  Therapeutic anticoagulation.  Recommended continue current dose of warfarin 5m - 1 tablet MWF and 2 tablets all other days. Patient notified.  Billing once per month interupertation fee.  Patient diagnosis - chronic atrial fibrillation  Procedure code if [id number]

## 2015-10-18 NOTE — Telephone Encounter (Signed)
Faxed signed note to requested number as per pt request Pt notified

## 2015-10-18 NOTE — Telephone Encounter (Signed)
Do you know what this is about?

## 2015-10-19 DIAGNOSIS — M9901 Segmental and somatic dysfunction of cervical region: Secondary | ICD-10-CM | POA: Diagnosis not present

## 2015-10-19 DIAGNOSIS — Z7722 Contact with and (suspected) exposure to environmental tobacco smoke (acute) (chronic): Secondary | ICD-10-CM | POA: Diagnosis not present

## 2015-10-19 DIAGNOSIS — M5137 Other intervertebral disc degeneration, lumbosacral region: Secondary | ICD-10-CM | POA: Diagnosis not present

## 2015-10-19 DIAGNOSIS — J370 Chronic laryngitis: Secondary | ICD-10-CM | POA: Diagnosis not present

## 2015-10-19 DIAGNOSIS — M9903 Segmental and somatic dysfunction of lumbar region: Secondary | ICD-10-CM | POA: Diagnosis not present

## 2015-10-19 DIAGNOSIS — J384 Edema of larynx: Secondary | ICD-10-CM | POA: Diagnosis not present

## 2015-10-19 DIAGNOSIS — M5441 Lumbago with sciatica, right side: Secondary | ICD-10-CM | POA: Diagnosis not present

## 2015-10-19 DIAGNOSIS — M9904 Segmental and somatic dysfunction of sacral region: Secondary | ICD-10-CM | POA: Diagnosis not present

## 2015-10-22 ENCOUNTER — Other Ambulatory Visit: Payer: Self-pay | Admitting: Pharmacist

## 2015-10-22 ENCOUNTER — Ambulatory Visit (INDEPENDENT_AMBULATORY_CARE_PROVIDER_SITE_OTHER): Payer: Medicare Other | Admitting: Family Medicine

## 2015-10-22 ENCOUNTER — Encounter: Payer: Self-pay | Admitting: Family Medicine

## 2015-10-22 VITALS — BP 119/73 | HR 65 | Temp 96.9°F | Ht 68.0 in | Wt 388.4 lb

## 2015-10-22 DIAGNOSIS — R931 Abnormal findings on diagnostic imaging of heart and coronary circulation: Secondary | ICD-10-CM

## 2015-10-22 DIAGNOSIS — M5106 Intervertebral disc disorders with myelopathy, lumbar region: Secondary | ICD-10-CM | POA: Diagnosis not present

## 2015-10-22 DIAGNOSIS — E785 Hyperlipidemia, unspecified: Secondary | ICD-10-CM

## 2015-10-22 DIAGNOSIS — R9430 Abnormal result of cardiovascular function study, unspecified: Secondary | ICD-10-CM | POA: Diagnosis not present

## 2015-10-22 DIAGNOSIS — M1710 Unilateral primary osteoarthritis, unspecified knee: Secondary | ICD-10-CM

## 2015-10-22 DIAGNOSIS — E1165 Type 2 diabetes mellitus with hyperglycemia: Secondary | ICD-10-CM

## 2015-10-22 DIAGNOSIS — E034 Atrophy of thyroid (acquired): Secondary | ICD-10-CM

## 2015-10-22 DIAGNOSIS — I420 Dilated cardiomyopathy: Secondary | ICD-10-CM

## 2015-10-22 DIAGNOSIS — I482 Chronic atrial fibrillation, unspecified: Secondary | ICD-10-CM

## 2015-10-22 DIAGNOSIS — E1140 Type 2 diabetes mellitus with diabetic neuropathy, unspecified: Secondary | ICD-10-CM

## 2015-10-22 DIAGNOSIS — M129 Arthropathy, unspecified: Secondary | ICD-10-CM

## 2015-10-22 DIAGNOSIS — E038 Other specified hypothyroidism: Secondary | ICD-10-CM

## 2015-10-22 DIAGNOSIS — E1142 Type 2 diabetes mellitus with diabetic polyneuropathy: Secondary | ICD-10-CM | POA: Diagnosis not present

## 2015-10-22 LAB — POCT GLYCOSYLATED HEMOGLOBIN (HGB A1C): Hemoglobin A1C: 5.9

## 2015-10-22 MED ORDER — ESCITALOPRAM OXALATE 20 MG PO TABS: 20.0000 mg | ORAL_TABLET | Freq: Every day | ORAL | Status: DC

## 2015-10-22 MED ORDER — OXYCODONE HCL 10 MG PO TABS: 10.0000 mg | ORAL_TABLET | Freq: Four times a day (QID) | ORAL | Status: DC | PRN

## 2015-10-22 MED ORDER — ALPRAZOLAM 1 MG PO TABS: 1.0000 mg | ORAL_TABLET | Freq: Three times a day (TID) | ORAL | Status: DC

## 2015-10-22 MED ORDER — OXYCODONE HCL 10 MG PO TABS: 10.0000 mg | ORAL_TABLET | Freq: Four times a day (QID) | ORAL | Status: DC

## 2015-10-22 MED ORDER — MIRTAZAPINE 30 MG PO TABS: 30.0000 mg | ORAL_TABLET | Freq: Every day | ORAL | Status: DC

## 2015-10-22 NOTE — Progress Notes (Signed)
Subjective:  Patient ID: Gregg Taylor, male    DOB: 02-04-1946  Age: 69 y.o. MRN: [id number]  CC: Pain; Diabetes; and Atrial Fibrillation   HPI Gregg Taylor presents for  follow-up of hypertension. Patient has no history of headache chest pain or shortness of breath or recent cough. Patient also denies symptoms of TIA such as numbness weakness lateralizing. Patient checks  blood pressure at home and has not had any elevated readings recently. Patient denies side effects from his medication. States taking it regularly.  Patient also  in for follow-up of elevated cholesterol. Doing well without complaints on current medication. Denies side effects of statin including myalgia and arthralgia and nausea. Also in today for liver function testing. Currently no chest pain, shortness of breath or other cardiovascular related symptoms noted.  Follow-up of diabetes. Patient does check blood sugar at home occasionally. Readings run roughly around 100-150. Patient denies symptoms such as polyuria, polydipsia, excessive hunger, nausea. No significant hypoglycemic spells noted. Medications as noted below. Taking them regularly without complication/adverse reaction being reported today.   Pt. Denies any palpitations, dizziness, syncope or chest pain from atrial fibrillation. He continues to be monitored with serial INR for coumadin use. Denies bleeding episodes including, but not limited to bruising, epistaxis, hematuria, hematochezia, melena and bleeding from the gums.  Pt. States pain in lower back is stable. Still has radiation to BLE at times.He has moderately severe pain, temporarily reduced to mild level with use of the oxycodone. Denies using more than the prescribed amount. Did not bring in his bottle for count. We discussed upcoming change in policy, including pill counts, drug urine screen and pain agreement. Pt. Voiced understanding.   History Gregg Taylor has a past medical history of Hypertension;  Hyperlipidemia; Arthritis; Depression; CHF (congestive heart failure) ([place]); Cancer ([place]); and Diabetes mellitus without complication ([place]).   He has past surgical history that includes Appendectomy; Cholecystectomy; Gastric bypass (1991); and Fracture surgery.   His family history includes Alcohol abuse in his father; Cancer in his mother; Depression in his brother and father; Heart disease in his father; Hypertension in his brother and father; Stroke in his brother.He reports that he quit smoking about 12 years ago. His smoking use included Cigarettes. He started smoking about 37 years ago. He smoked 0.50 packs per day. He has never used smokeless tobacco. He reports that he does not drink alcohol or use illicit drugs.  Current Outpatient Prescriptions on File Prior to Visit  Medication Sig Dispense Refill  . Ascorbic Acid (VITAMIN C) 1000 MG tablet Take 1,000 mg by mouth.    Marland Kitchen aspirin 81 MG chewable tablet Chew 81 mg by mouth daily.     . blood glucose meter kit and supplies KIT Dispense based on patient and insurance preference. Use up to four times daily as directed. (FOR ICD-9 250.00, 250.01). 1 each 11  . bumetanide (BUMEX) 2 MG tablet Take 1 tablet by mouth 2 (two) times daily.  6  . Cholecalciferol (VITAMIN D3) 2000 UNITS TABS Take 2,000 Units by mouth.    . gabapentin (NEURONTIN) 300 MG capsule TAKE 2 CAPSULES BY MOUTH TWICE A DAY 360 capsule 0  . hydrALAZINE (APRESOLINE) 25 MG tablet Take 2 tablets (50 mg total) by mouth 3 (three) times daily. 180 tablet 2  . levocetirizine (XYZAL) 5 MG tablet Take 1 tablet (5 mg total) by mouth daily. 90 tablet 1  . levothyroxine (SYNTHROID, LEVOTHROID) 137 MCG tablet TAKE ONE TABLET BY MOUTH EVERY MORNING 90 tablet  2  . metoprolol succinate (TOPROL-XL) 25 MG 24 hr tablet Take 1 tablet (25 mg total) by mouth 2 (two) times daily. 60 tablet 5  . Multiple Vitamin (MULTIVITAMIN) tablet Take 1 tablet by mouth.    . OXYGEN Inhale 2 L into the lungs.    .  pantoprazole (PROTONIX) 40 MG tablet Take 1 tablet by mouth 2 (two) times daily.  1  . potassium chloride (K-DUR) 10 MEQ tablet Take 4 tablets (40 mEq total) by mouth 2 (two) times daily. 120 tablet   . quinapril (ACCUPRIL) 40 MG tablet Take 20 mg by mouth 2 (two) times daily.   0  . Respiratory Therapy Supplies MISC CPAP at HS.    . simvastatin (ZOCOR) 20 MG tablet Take 1 tablet by mouth at bedtime.  1  . spironolactone (ALDACTONE) 25 MG tablet Take 1 tablet (25 mg total) by mouth daily. 90 tablet 1  . temazepam (RESTORIL) 30 MG capsule Take 1 capsule (30 mg total) by mouth at bedtime. 30 capsule 0  . vitamin B-12 (CYANOCOBALAMIN) 500 MCG tablet Take 500 mcg by mouth.    . warfarin (COUMADIN) 4 MG tablet Take 2 tablets (8 mg total) by mouth daily. Per anticoagulation clinic directions 180 tablet 1   No current facility-administered medications on file prior to visit.    ROS Review of Systems  Constitutional: Negative for fever, chills, diaphoresis and unexpected weight change.  HENT: Negative for congestion, hearing loss, rhinorrhea and sore throat.   Eyes: Negative for visual disturbance.  Respiratory: Positive for shortness of breath (with exertion. Currently at baseline.). Negative for cough, chest tightness and wheezing.   Cardiovascular: Negative for chest pain.  Gastrointestinal: Negative for abdominal pain, diarrhea and constipation.  Genitourinary: Negative for dysuria and flank pain.  Musculoskeletal: Positive for myalgias, back pain and joint swelling (ankles). Negative for arthralgias.  Skin: Negative for rash.  Neurological: Negative for dizziness and headaches.  Hematological: Does not bruise/bleed easily.  Psychiatric/Behavioral: Negative for sleep disturbance and dysphoric mood.    Objective:  BP 119/73 mmHg  Pulse 65  Temp(Src) 96.9 F (36.1 C) (Oral)  Ht _0  (1.727 m)  Wt 388 lb 6.4 oz (176.177 kg)  BMI 59.07 kg/m2  SpO2 95%  BP Readings from Last 3  Encounters:  10/22/15 119/73  07/08/15 112/70  06/11/15 109/63    Wt Readings from Last 3 Encounters:  10/22/15 388 lb 6.4 oz (176.177 kg)  08/03/15 380 lb 9.6 oz (172.639 kg)  07/08/15 367 lb (166.47 kg)     Physical Exam  Constitutional: He is oriented to person, place, and time. He appears well-developed and well-nourished. No distress.  Morbidly obese  HENT:  Head: Normocephalic and atraumatic.  Right Ear: External ear normal.  Left Ear: External ear normal.  Nose: Nose normal.  Mouth/Throat: Oropharynx is clear and moist.  Eyes: Conjunctivae and EOM are normal. Pupils are equal, round, and reactive to light.  Neck: Normal range of motion. Neck supple. No thyromegaly present.  Cardiovascular: Normal rate, regular rhythm and normal heart sounds.   No murmur heard. Pulmonary/Chest: Effort normal and breath sounds normal. No respiratory distress. He has no wheezes. He has no rales.  Abdominal: Soft. Bowel sounds are normal. He exhibits no distension. There is no tenderness.  Lymphadenopathy:    He has no cervical adenopathy.  Neurological: He is alert and oriented to person, place, and time. He has normal reflexes.  Skin: Skin is warm and dry.  Psychiatric: He has  a normal mood and affect. His behavior is normal. Judgment and thought content normal.    Lab Results  Component Value Date   HGBA1C 5.9 10/22/2015   HGBA1C 5.8 08/03/2015   HGBA1C 5.9 02/19/2015    Lab Results  Component Value Date   WBC 7.4 05/28/2015   HGB 11.5* 09/22/2015   HCT 37.2* 05/28/2015   GLUCOSE 97 10/22/2015   CHOL 130 10/22/2015   TRIG 56 10/22/2015   HDL 69 10/22/2015   LDLCALC 50 10/22/2015   ALT 16 10/22/2015   AST 18 10/22/2015   NA 146* 10/22/2015   K 5.2 10/22/2015   CL 101 10/22/2015   CREATININE 1.14 10/22/2015   BUN 25 10/22/2015   CO2 32* 10/22/2015   TSH 3.940 10/22/2015   INR 2.76* 10/14/2015   HGBA1C 5.9 10/22/2015    Patient was never admitted.  Assessment &  Plan:   Einar was seen today for pain, diabetes and atrial fibrillation.  Diagnoses and all orders for this visit:  Type 2 diabetes mellitus, uncontrolled, with neuropathy ([place]) -     Microalbumin / creatinine urine ratio -     POCT glycosylated hemoglobin (Hb A1C) -     CMP14+EGFR -     Lipid panel -     Cancel: Microalbumin / creatinine urine ratio -     POCT urinalysis dipstick -     TSH + free T4  Chronic atrial fibrillation ([facility]) -     POCT INR  Congestive dilated cardiomyopathy ([facility]) -     POCT INR  Hypothyroidism due to acquired atrophy of thyroid -     TSH + free T4  Intervertebral lumbar disc disorder with myelopathy, lumbar region  Arthritis of knee  Cardiac LV ejection fraction 30-35%  Hyperlipidemia -     CMP14+EGFR -     Lipid panel  Other orders -     ALPRAZolam (XANAX) 1 MG tablet; Take 1 tablet (1 mg total) by mouth 3 (three) times daily. -     Discontinue: Oxycodone HCl 10 MG TABS; Take 1 tablet (10 mg total) by mouth every 6 (six) hours as needed. -     Discontinue: Oxycodone HCl 10 MG TABS; Take 1 tablet (10 mg total) by mouth 4 (four) times daily as needed (for severe pain only). -     Discontinue: Oxycodone HCl 10 MG TABS; Take 1 tablet (10 mg total) by mouth 4 (four) times daily. -     Oxycodone HCl 10 MG TABS; Take 1 tablet (10 mg total) by mouth 4 (four) times daily as needed (for severe pain only). -     Oxycodone HCl 10 MG TABS; Take 1 tablet (10 mg total) by mouth every 6 (six) hours as needed. -     Oxycodone HCl 10 MG TABS; Take 1 tablet (10 mg total) by mouth 4 (four) times daily. -     mirtazapine (REMERON) 30 MG tablet; Take 1 tablet (30 mg total) by mouth at bedtime. For sleep and depression -     escitalopram (LEXAPRO) 20 MG tablet; Take 1 tablet (20 mg total) by mouth daily.   I have changed Gregg Taylor's escitalopram. I am also having him start on mirtazapine. Additionally, I am having him maintain his bumetanide, pantoprazole,  quinapril, simvastatin, vitamin C, aspirin, Vitamin D3, multivitamin, OXYGEN, Respiratory Therapy Supplies, vitamin B-12, metoprolol succinate, levothyroxine, potassium chloride, levocetirizine, spironolactone, warfarin, hydrALAZINE, temazepam, blood glucose meter kit and supplies, gabapentin, ALPRAZolam, Oxycodone HCl, Oxycodone HCl,  and Oxycodone HCl.  Meds ordered this encounter  Medications  . ALPRAZolam (XANAX) 1 MG tablet    Sig: Take 1 tablet (1 mg total) by mouth 3 (three) times daily.    Dispense:  90 tablet    Refill:  5  . DISCONTD: Oxycodone HCl 10 MG TABS    Sig: Take 1 tablet (10 mg total) by mouth every 6 (six) hours as needed.    Dispense:  120 tablet    Refill:  0  . DISCONTD: Oxycodone HCl 10 MG TABS    Sig: Take 1 tablet (10 mg total) by mouth 4 (four) times daily as needed (for severe pain only).    Dispense:  120 tablet    Refill:  0    Do not fill until September 02, 2015  . DISCONTD: Oxycodone HCl 10 MG TABS    Sig: Take 1 tablet (10 mg total) by mouth 4 (four) times daily.    Dispense:  120 tablet    Refill:  0    Do not fill until November 26  . Oxycodone HCl 10 MG TABS    Sig: Take 1 tablet (10 mg total) by mouth 4 (four) times daily as needed (for severe pain only).    Dispense:  120 tablet    Refill:  0    Do not fill until December26, 2016  . Oxycodone HCl 10 MG TABS    Sig: Take 1 tablet (10 mg total) by mouth every 6 (six) hours as needed.    Dispense:  120 tablet    Refill:  0    Do not fill until December 31, 2015  . Oxycodone HCl 10 MG TABS    Sig: Take 1 tablet (10 mg total) by mouth 4 (four) times daily.    Dispense:  120 tablet    Refill:  0    Do not fill until December 01, 2015  . mirtazapine (REMERON) 30 MG tablet    Sig: Take 1 tablet (30 mg total) by mouth at bedtime. For sleep and depression    Dispense:  30 tablet    Refill:  5  . escitalopram (LEXAPRO) 20 MG tablet    Sig: Take 1 tablet (20 mg total) by mouth daily.    Dispense:  30  tablet    Refill:  5     Follow-up: Return in about 3 months (around 01/20/2016) for diabetes, hypertension, Depression, cholesterol, CPE.  Claretta Fraise, M.D.

## 2015-10-23 LAB — CMP14+EGFR
ALBUMIN: 3.4 g/dL — AB (ref 3.6–4.8)
ALK PHOS: 77 IU/L (ref 39–117)
ALT: 16 IU/L (ref 0–44)
AST: 18 IU/L (ref 0–40)
Albumin/Globulin Ratio: 1.3 (ref 1.1–2.5)
BUN/Creatinine Ratio: 22 (ref 10–22)
BUN: 25 mg/dL (ref 8–27)
Bilirubin Total: 0.2 mg/dL (ref 0.0–1.2)
CO2: 32 mmol/L — AB (ref 18–29)
CREATININE: 1.14 mg/dL (ref 0.76–1.27)
Calcium: 9 mg/dL (ref 8.6–10.2)
Chloride: 101 mmol/L (ref 96–106)
GFR calc Af Amer: 75 mL/min/{1.73_m2} (ref >59)
GFR calc non Af Amer: 65 mL/min/{1.73_m2} (ref >59)
GLUCOSE: 97 mg/dL (ref 65–99)
Globulin, Total: 2.7 g/dL (ref 1.5–4.5)
Potassium: 5.2 mmol/L (ref 3.5–5.2)
Sodium: 146 mmol/L — ABNORMAL HIGH (ref 134–144)
Total Protein: 6.1 g/dL (ref 6.0–8.5)

## 2015-10-23 LAB — TSH+FREE T4
FREE T4: 1.27 ng/dL (ref 0.82–1.77)
TSH: 3.94 u[IU]/mL (ref 0.450–4.500)

## 2015-10-23 LAB — LIPID PANEL
CHOLESTEROL TOTAL: 130 mg/dL (ref 100–199)
Chol/HDL Ratio: 1.9 ratio units (ref 0.0–5.0)
HDL: 69 mg/dL (ref >39)
LDL CALC: 50 mg/dL (ref 0–99)
TRIGLYCERIDES: 56 mg/dL (ref 0–149)
VLDL CHOLESTEROL CAL: 11 mg/dL (ref 5–40)

## 2015-10-25 ENCOUNTER — Encounter: Payer: Self-pay | Admitting: Family Medicine

## 2015-11-03 DIAGNOSIS — M5441 Lumbago with sciatica, right side: Secondary | ICD-10-CM | POA: Diagnosis not present

## 2015-11-03 DIAGNOSIS — M9901 Segmental and somatic dysfunction of cervical region: Secondary | ICD-10-CM | POA: Diagnosis not present

## 2015-11-03 DIAGNOSIS — M5137 Other intervertebral disc degeneration, lumbosacral region: Secondary | ICD-10-CM | POA: Diagnosis not present

## 2015-11-03 DIAGNOSIS — M9903 Segmental and somatic dysfunction of lumbar region: Secondary | ICD-10-CM | POA: Diagnosis not present

## 2015-11-03 DIAGNOSIS — M9904 Segmental and somatic dysfunction of sacral region: Secondary | ICD-10-CM | POA: Diagnosis not present

## 2015-11-11 ENCOUNTER — Other Ambulatory Visit: Payer: Self-pay | Admitting: Family Medicine

## 2015-11-11 DIAGNOSIS — I482 Chronic atrial fibrillation: Secondary | ICD-10-CM | POA: Diagnosis not present

## 2015-11-11 DIAGNOSIS — M5441 Lumbago with sciatica, right side: Secondary | ICD-10-CM | POA: Diagnosis not present

## 2015-11-11 DIAGNOSIS — M9904 Segmental and somatic dysfunction of sacral region: Secondary | ICD-10-CM | POA: Diagnosis not present

## 2015-11-11 DIAGNOSIS — M9903 Segmental and somatic dysfunction of lumbar region: Secondary | ICD-10-CM | POA: Diagnosis not present

## 2015-11-11 DIAGNOSIS — M9901 Segmental and somatic dysfunction of cervical region: Secondary | ICD-10-CM | POA: Diagnosis not present

## 2015-11-11 DIAGNOSIS — M5137 Other intervertebral disc degeneration, lumbosacral region: Secondary | ICD-10-CM | POA: Diagnosis not present

## 2015-11-12 LAB — PROTIME-INR
INR: 1.6 — ABNORMAL HIGH (ref 0.8–1.2)
Prothrombin Time: 16.9 s — ABNORMAL HIGH (ref 9.1–12.0)

## 2015-11-19 ENCOUNTER — Telehealth: Payer: Self-pay | Admitting: Family Medicine

## 2015-11-19 MED ORDER — LUBIPROSTONE 24 MCG PO CAPS: 24.0000 ug | ORAL_CAPSULE | Freq: Two times a day (BID) | ORAL | Status: DC

## 2015-11-19 NOTE — Telephone Encounter (Signed)
done

## 2015-11-19 NOTE — Telephone Encounter (Signed)
Pt wants RX for Amitiza due to constipation Not in pt's history Please review and advise

## 2015-11-19 NOTE — Telephone Encounter (Signed)
Pt notified of RX And INR order was sent to [facility]

## 2015-11-22 ENCOUNTER — Ambulatory Visit (INDEPENDENT_AMBULATORY_CARE_PROVIDER_SITE_OTHER): Payer: Medicare Other | Admitting: Pharmacist

## 2015-11-22 DIAGNOSIS — I482 Chronic atrial fibrillation, unspecified: Secondary | ICD-10-CM

## 2015-11-23 ENCOUNTER — Other Ambulatory Visit: Payer: Self-pay | Admitting: Family Medicine

## 2015-11-23 DIAGNOSIS — M5441 Lumbago with sciatica, right side: Secondary | ICD-10-CM | POA: Diagnosis not present

## 2015-11-23 DIAGNOSIS — M9904 Segmental and somatic dysfunction of sacral region: Secondary | ICD-10-CM | POA: Diagnosis not present

## 2015-11-23 DIAGNOSIS — M9903 Segmental and somatic dysfunction of lumbar region: Secondary | ICD-10-CM | POA: Diagnosis not present

## 2015-11-23 DIAGNOSIS — M9901 Segmental and somatic dysfunction of cervical region: Secondary | ICD-10-CM | POA: Diagnosis not present

## 2015-11-23 DIAGNOSIS — M5137 Other intervertebral disc degeneration, lumbosacral region: Secondary | ICD-10-CM | POA: Diagnosis not present

## 2015-11-23 DIAGNOSIS — I482 Chronic atrial fibrillation: Secondary | ICD-10-CM | POA: Diagnosis not present

## 2015-11-24 LAB — PROTIME-INR
INR: 3.9 — AB (ref 0.8–1.2)
PROTHROMBIN TIME: 39 s — AB (ref 9.1–12.0)

## 2015-11-25 DIAGNOSIS — M170 Bilateral primary osteoarthritis of knee: Secondary | ICD-10-CM | POA: Diagnosis not present

## 2015-11-25 DIAGNOSIS — M25551 Pain in right hip: Secondary | ICD-10-CM | POA: Diagnosis not present

## 2015-11-25 DIAGNOSIS — M25511 Pain in right shoulder: Secondary | ICD-10-CM | POA: Diagnosis not present

## 2015-11-30 ENCOUNTER — Other Ambulatory Visit: Payer: Self-pay | Admitting: Family Medicine

## 2015-11-30 DIAGNOSIS — M5441 Lumbago with sciatica, right side: Secondary | ICD-10-CM | POA: Diagnosis not present

## 2015-11-30 DIAGNOSIS — M5137 Other intervertebral disc degeneration, lumbosacral region: Secondary | ICD-10-CM | POA: Diagnosis not present

## 2015-11-30 DIAGNOSIS — M9903 Segmental and somatic dysfunction of lumbar region: Secondary | ICD-10-CM | POA: Diagnosis not present

## 2015-11-30 DIAGNOSIS — I482 Chronic atrial fibrillation: Secondary | ICD-10-CM | POA: Diagnosis not present

## 2015-11-30 DIAGNOSIS — M9901 Segmental and somatic dysfunction of cervical region: Secondary | ICD-10-CM | POA: Diagnosis not present

## 2015-11-30 DIAGNOSIS — M9904 Segmental and somatic dysfunction of sacral region: Secondary | ICD-10-CM | POA: Diagnosis not present

## 2015-11-30 LAB — POCT INR: INR: 3.2

## 2015-12-01 ENCOUNTER — Ambulatory Visit (INDEPENDENT_AMBULATORY_CARE_PROVIDER_SITE_OTHER): Payer: Medicare Other | Admitting: Pharmacist

## 2015-12-01 DIAGNOSIS — I482 Chronic atrial fibrillation, unspecified: Secondary | ICD-10-CM

## 2015-12-01 LAB — PROTIME-INR
INR: 3.2 — ABNORMAL HIGH (ref 0.8–1.2)
Prothrombin Time: 32.2 s — ABNORMAL HIGH (ref 9.1–12.0)

## 2015-12-01 MED ORDER — WARFARIN SODIUM 4 MG PO TABS: 4.0000 mg | ORAL_TABLET | Freq: Every day | ORAL | Status: DC

## 2015-12-01 MED ORDER — WARFARIN SODIUM 5 MG PO TABS: 5.0000 mg | ORAL_TABLET | Freq: Every day | ORAL | Status: DC

## 2015-12-01 NOTE — Progress Notes (Signed)
INR checked at [facility]

## 2015-12-03 DIAGNOSIS — M9901 Segmental and somatic dysfunction of cervical region: Secondary | ICD-10-CM | POA: Diagnosis not present

## 2015-12-03 DIAGNOSIS — Z08 Encounter for follow-up examination after completed treatment for malignant neoplasm: Secondary | ICD-10-CM | POA: Diagnosis not present

## 2015-12-03 DIAGNOSIS — L570 Actinic keratosis: Secondary | ICD-10-CM | POA: Diagnosis not present

## 2015-12-03 DIAGNOSIS — M5137 Other intervertebral disc degeneration, lumbosacral region: Secondary | ICD-10-CM | POA: Diagnosis not present

## 2015-12-03 DIAGNOSIS — M5441 Lumbago with sciatica, right side: Secondary | ICD-10-CM | POA: Diagnosis not present

## 2015-12-03 DIAGNOSIS — M9904 Segmental and somatic dysfunction of sacral region: Secondary | ICD-10-CM | POA: Diagnosis not present

## 2015-12-03 DIAGNOSIS — M9903 Segmental and somatic dysfunction of lumbar region: Secondary | ICD-10-CM | POA: Diagnosis not present

## 2015-12-03 DIAGNOSIS — Z85828 Personal history of other malignant neoplasm of skin: Secondary | ICD-10-CM | POA: Diagnosis not present

## 2015-12-03 DIAGNOSIS — Z872 Personal history of diseases of the skin and subcutaneous tissue: Secondary | ICD-10-CM | POA: Diagnosis not present

## 2015-12-04 ENCOUNTER — Telehealth: Payer: Self-pay | Admitting: Family Medicine

## 2015-12-04 NOTE — Telephone Encounter (Signed)
Returned patient's phone call and stated that patient will need to be seen.  Patient states he will call back Monday to let us know how he is feeling and to see if he can make an appointment.  Offered to make patient appointment to come in on monday

## 2015-12-09 DIAGNOSIS — E1151 Type 2 diabetes mellitus with diabetic peripheral angiopathy without gangrene: Secondary | ICD-10-CM | POA: Diagnosis not present

## 2015-12-09 DIAGNOSIS — L600 Ingrowing nail: Secondary | ICD-10-CM | POA: Diagnosis not present

## 2015-12-13 ENCOUNTER — Other Ambulatory Visit: Payer: Self-pay | Admitting: Family Medicine

## 2015-12-13 DIAGNOSIS — M5441 Lumbago with sciatica, right side: Secondary | ICD-10-CM | POA: Diagnosis not present

## 2015-12-13 DIAGNOSIS — M9901 Segmental and somatic dysfunction of cervical region: Secondary | ICD-10-CM | POA: Diagnosis not present

## 2015-12-13 DIAGNOSIS — M9903 Segmental and somatic dysfunction of lumbar region: Secondary | ICD-10-CM | POA: Diagnosis not present

## 2015-12-13 DIAGNOSIS — M5137 Other intervertebral disc degeneration, lumbosacral region: Secondary | ICD-10-CM | POA: Diagnosis not present

## 2015-12-13 DIAGNOSIS — M9904 Segmental and somatic dysfunction of sacral region: Secondary | ICD-10-CM | POA: Diagnosis not present

## 2015-12-13 DIAGNOSIS — I482 Chronic atrial fibrillation: Secondary | ICD-10-CM | POA: Diagnosis not present

## 2015-12-14 LAB — PROTIME-INR
INR: 2.3 — AB (ref 0.8–1.2)
PROTHROMBIN TIME: 23.8 s — AB (ref 9.1–12.0)

## 2015-12-16 ENCOUNTER — Ambulatory Visit (INDEPENDENT_AMBULATORY_CARE_PROVIDER_SITE_OTHER): Payer: Self-pay | Admitting: Pharmacist

## 2015-12-16 DIAGNOSIS — M9901 Segmental and somatic dysfunction of cervical region: Secondary | ICD-10-CM | POA: Diagnosis not present

## 2015-12-16 DIAGNOSIS — I493 Ventricular premature depolarization: Secondary | ICD-10-CM | POA: Diagnosis not present

## 2015-12-16 DIAGNOSIS — G4733 Obstructive sleep apnea (adult) (pediatric): Secondary | ICD-10-CM | POA: Diagnosis not present

## 2015-12-16 DIAGNOSIS — I482 Chronic atrial fibrillation, unspecified: Secondary | ICD-10-CM

## 2015-12-16 DIAGNOSIS — M5137 Other intervertebral disc degeneration, lumbosacral region: Secondary | ICD-10-CM | POA: Diagnosis not present

## 2015-12-16 DIAGNOSIS — I5042 Chronic combined systolic (congestive) and diastolic (congestive) heart failure: Secondary | ICD-10-CM | POA: Diagnosis not present

## 2015-12-16 DIAGNOSIS — M9904 Segmental and somatic dysfunction of sacral region: Secondary | ICD-10-CM | POA: Diagnosis not present

## 2015-12-16 DIAGNOSIS — Z6841 Body Mass Index (BMI) 40.0 and over, adult: Secondary | ICD-10-CM | POA: Diagnosis not present

## 2015-12-16 DIAGNOSIS — I472 Ventricular tachycardia: Secondary | ICD-10-CM | POA: Diagnosis not present

## 2015-12-16 DIAGNOSIS — I272 Other secondary pulmonary hypertension: Secondary | ICD-10-CM | POA: Diagnosis not present

## 2015-12-16 DIAGNOSIS — M9903 Segmental and somatic dysfunction of lumbar region: Secondary | ICD-10-CM | POA: Diagnosis not present

## 2015-12-16 DIAGNOSIS — M5441 Lumbago with sciatica, right side: Secondary | ICD-10-CM | POA: Diagnosis not present

## 2015-12-16 DIAGNOSIS — I2510 Atherosclerotic heart disease of native coronary artery without angina pectoris: Secondary | ICD-10-CM | POA: Diagnosis not present

## 2015-12-16 DIAGNOSIS — Z86711 Personal history of pulmonary embolism: Secondary | ICD-10-CM | POA: Diagnosis not present

## 2015-12-16 DIAGNOSIS — I491 Atrial premature depolarization: Secondary | ICD-10-CM | POA: Diagnosis not present

## 2015-12-16 DIAGNOSIS — I4510 Unspecified right bundle-branch block: Secondary | ICD-10-CM | POA: Diagnosis not present

## 2015-12-16 NOTE — Progress Notes (Signed)
INR checked by Davene Costain and fax to our office.  Patient notified of results and dosing recommendations.  Recheck in 2 weeks.

## 2015-12-22 DIAGNOSIS — M9903 Segmental and somatic dysfunction of lumbar region: Secondary | ICD-10-CM | POA: Diagnosis not present

## 2015-12-22 DIAGNOSIS — M5441 Lumbago with sciatica, right side: Secondary | ICD-10-CM | POA: Diagnosis not present

## 2015-12-22 DIAGNOSIS — M9901 Segmental and somatic dysfunction of cervical region: Secondary | ICD-10-CM | POA: Diagnosis not present

## 2015-12-22 DIAGNOSIS — M5137 Other intervertebral disc degeneration, lumbosacral region: Secondary | ICD-10-CM | POA: Diagnosis not present

## 2015-12-22 DIAGNOSIS — M9904 Segmental and somatic dysfunction of sacral region: Secondary | ICD-10-CM | POA: Diagnosis not present

## 2015-12-23 ENCOUNTER — Other Ambulatory Visit: Payer: Self-pay | Admitting: Family Medicine

## 2015-12-27 DIAGNOSIS — M9901 Segmental and somatic dysfunction of cervical region: Secondary | ICD-10-CM | POA: Diagnosis not present

## 2015-12-27 DIAGNOSIS — M9903 Segmental and somatic dysfunction of lumbar region: Secondary | ICD-10-CM | POA: Diagnosis not present

## 2015-12-27 DIAGNOSIS — M5137 Other intervertebral disc degeneration, lumbosacral region: Secondary | ICD-10-CM | POA: Diagnosis not present

## 2015-12-27 DIAGNOSIS — M5441 Lumbago with sciatica, right side: Secondary | ICD-10-CM | POA: Diagnosis not present

## 2015-12-27 DIAGNOSIS — M9904 Segmental and somatic dysfunction of sacral region: Secondary | ICD-10-CM | POA: Diagnosis not present

## 2015-12-31 DIAGNOSIS — I272 Other secondary pulmonary hypertension: Secondary | ICD-10-CM | POA: Diagnosis not present

## 2015-12-31 DIAGNOSIS — I5042 Chronic combined systolic (congestive) and diastolic (congestive) heart failure: Secondary | ICD-10-CM | POA: Diagnosis not present

## 2015-12-31 DIAGNOSIS — I2510 Atherosclerotic heart disease of native coronary artery without angina pectoris: Secondary | ICD-10-CM | POA: Diagnosis not present

## 2015-12-31 DIAGNOSIS — Z86711 Personal history of pulmonary embolism: Secondary | ICD-10-CM | POA: Diagnosis not present

## 2015-12-31 DIAGNOSIS — G4733 Obstructive sleep apnea (adult) (pediatric): Secondary | ICD-10-CM | POA: Diagnosis not present

## 2015-12-31 DIAGNOSIS — I10 Essential (primary) hypertension: Secondary | ICD-10-CM | POA: Diagnosis not present

## 2015-12-31 DIAGNOSIS — I472 Ventricular tachycardia: Secondary | ICD-10-CM | POA: Diagnosis not present

## 2016-01-04 DIAGNOSIS — M9901 Segmental and somatic dysfunction of cervical region: Secondary | ICD-10-CM | POA: Diagnosis not present

## 2016-01-04 DIAGNOSIS — M9904 Segmental and somatic dysfunction of sacral region: Secondary | ICD-10-CM | POA: Diagnosis not present

## 2016-01-04 DIAGNOSIS — M5137 Other intervertebral disc degeneration, lumbosacral region: Secondary | ICD-10-CM | POA: Diagnosis not present

## 2016-01-04 DIAGNOSIS — M9903 Segmental and somatic dysfunction of lumbar region: Secondary | ICD-10-CM | POA: Diagnosis not present

## 2016-01-04 DIAGNOSIS — M5441 Lumbago with sciatica, right side: Secondary | ICD-10-CM | POA: Diagnosis not present

## 2016-01-11 DIAGNOSIS — M9903 Segmental and somatic dysfunction of lumbar region: Secondary | ICD-10-CM | POA: Diagnosis not present

## 2016-01-11 DIAGNOSIS — I472 Ventricular tachycardia: Secondary | ICD-10-CM | POA: Diagnosis not present

## 2016-01-11 DIAGNOSIS — M9904 Segmental and somatic dysfunction of sacral region: Secondary | ICD-10-CM | POA: Diagnosis not present

## 2016-01-11 DIAGNOSIS — M5137 Other intervertebral disc degeneration, lumbosacral region: Secondary | ICD-10-CM | POA: Diagnosis not present

## 2016-01-11 DIAGNOSIS — M9901 Segmental and somatic dysfunction of cervical region: Secondary | ICD-10-CM | POA: Diagnosis not present

## 2016-01-11 DIAGNOSIS — M5441 Lumbago with sciatica, right side: Secondary | ICD-10-CM | POA: Diagnosis not present

## 2016-01-17 DIAGNOSIS — I482 Chronic atrial fibrillation: Secondary | ICD-10-CM | POA: Diagnosis not present

## 2016-01-17 DIAGNOSIS — Z7901 Long term (current) use of anticoagulants: Secondary | ICD-10-CM | POA: Diagnosis not present

## 2016-01-17 LAB — POCT INR: INR: 3.1

## 2016-01-18 ENCOUNTER — Telehealth: Payer: Self-pay | Admitting: Pharmacist Clinician (PhC)/ Clinical Pharmacy Specialist

## 2016-01-18 ENCOUNTER — Ambulatory Visit (INDEPENDENT_AMBULATORY_CARE_PROVIDER_SITE_OTHER): Payer: Medicare Other | Admitting: Pharmacist Clinician (PhC)/ Clinical Pharmacy Specialist

## 2016-01-18 DIAGNOSIS — I482 Chronic atrial fibrillation, unspecified: Secondary | ICD-10-CM

## 2016-01-18 LAB — POCT INR: INR: 3.1

## 2016-01-18 NOTE — Telephone Encounter (Signed)
Left message for patient to continue taking warfarin the same way but to consume a high vitamin K food today or tomorrow and call if any of his medications had changed or if he had any signs or symptoms of bleeding

## 2016-01-21 ENCOUNTER — Telehealth: Payer: Self-pay

## 2016-01-21 ENCOUNTER — Ambulatory Visit (INDEPENDENT_AMBULATORY_CARE_PROVIDER_SITE_OTHER): Payer: Self-pay | Admitting: Pharmacist

## 2016-01-21 DIAGNOSIS — I482 Chronic atrial fibrillation, unspecified: Secondary | ICD-10-CM

## 2016-01-21 NOTE — Telephone Encounter (Signed)
Already spoke to patient earlier today.  Due to recheck in 1 week.

## 2016-01-21 NOTE — Telephone Encounter (Signed)
Mandy from [place] said patient was approved for home INR's on Monday  Had one done already

## 2016-01-21 NOTE — Progress Notes (Signed)
No charge for labs or visit - INR check through home monitoring system  

## 2016-01-24 ENCOUNTER — Ambulatory Visit (INDEPENDENT_AMBULATORY_CARE_PROVIDER_SITE_OTHER): Payer: Self-pay | Admitting: Pharmacist

## 2016-01-24 DIAGNOSIS — H2513 Age-related nuclear cataract, bilateral: Secondary | ICD-10-CM | POA: Diagnosis not present

## 2016-01-24 DIAGNOSIS — I482 Chronic atrial fibrillation, unspecified: Secondary | ICD-10-CM

## 2016-01-24 DIAGNOSIS — H16223 Keratoconjunctivitis sicca, not specified as Sjogren's, bilateral: Secondary | ICD-10-CM | POA: Diagnosis not present

## 2016-01-24 DIAGNOSIS — E119 Type 2 diabetes mellitus without complications: Secondary | ICD-10-CM | POA: Diagnosis not present

## 2016-01-24 DIAGNOSIS — H2512 Age-related nuclear cataract, left eye: Secondary | ICD-10-CM | POA: Diagnosis not present

## 2016-01-24 LAB — HM DIABETES EYE EXAM

## 2016-01-24 LAB — POCT INR: INR: 2.7

## 2016-01-26 ENCOUNTER — Ambulatory Visit (INDEPENDENT_AMBULATORY_CARE_PROVIDER_SITE_OTHER): Payer: Medicare Other | Admitting: Family Medicine

## 2016-01-26 ENCOUNTER — Encounter: Payer: Self-pay | Admitting: Family Medicine

## 2016-01-26 VITALS — BP 131/74 | HR 94 | Temp 97.2°F | Ht 68.0 in | Wt >= 6400 oz

## 2016-01-26 DIAGNOSIS — I482 Chronic atrial fibrillation, unspecified: Secondary | ICD-10-CM

## 2016-01-26 DIAGNOSIS — M5416 Radiculopathy, lumbar region: Secondary | ICD-10-CM | POA: Diagnosis not present

## 2016-01-26 DIAGNOSIS — G4733 Obstructive sleep apnea (adult) (pediatric): Secondary | ICD-10-CM

## 2016-01-26 DIAGNOSIS — F1320 Sedative, hypnotic or anxiolytic dependence, uncomplicated: Secondary | ICD-10-CM | POA: Diagnosis not present

## 2016-01-26 DIAGNOSIS — M5126 Other intervertebral disc displacement, lumbar region: Secondary | ICD-10-CM

## 2016-01-26 DIAGNOSIS — Z1211 Encounter for screening for malignant neoplasm of colon: Secondary | ICD-10-CM

## 2016-01-26 DIAGNOSIS — E034 Atrophy of thyroid (acquired): Secondary | ICD-10-CM

## 2016-01-26 DIAGNOSIS — Z0000 Encounter for general adult medical examination without abnormal findings: Secondary | ICD-10-CM

## 2016-01-26 DIAGNOSIS — E785 Hyperlipidemia, unspecified: Secondary | ICD-10-CM

## 2016-01-26 DIAGNOSIS — I420 Dilated cardiomyopathy: Secondary | ICD-10-CM

## 2016-01-26 DIAGNOSIS — F1120 Opioid dependence, uncomplicated: Secondary | ICD-10-CM | POA: Diagnosis not present

## 2016-01-26 DIAGNOSIS — M1710 Unilateral primary osteoarthritis, unspecified knee: Secondary | ICD-10-CM

## 2016-01-26 DIAGNOSIS — E1142 Type 2 diabetes mellitus with diabetic polyneuropathy: Secondary | ICD-10-CM | POA: Diagnosis not present

## 2016-01-26 DIAGNOSIS — Z79899 Other long term (current) drug therapy: Secondary | ICD-10-CM | POA: Diagnosis not present

## 2016-01-26 DIAGNOSIS — E559 Vitamin D deficiency, unspecified: Secondary | ICD-10-CM

## 2016-01-26 DIAGNOSIS — Z125 Encounter for screening for malignant neoplasm of prostate: Secondary | ICD-10-CM

## 2016-01-26 DIAGNOSIS — H25013 Cortical age-related cataract, bilateral: Secondary | ICD-10-CM

## 2016-01-26 DIAGNOSIS — E038 Other specified hypothyroidism: Secondary | ICD-10-CM | POA: Diagnosis not present

## 2016-01-26 DIAGNOSIS — M129 Arthropathy, unspecified: Secondary | ICD-10-CM | POA: Diagnosis not present

## 2016-01-26 LAB — COAGUCHEK XS/INR WAIVED
INR: 2.5 — ABNORMAL HIGH (ref 0.9–1.1)
Prothrombin Time: 29.8 s

## 2016-01-26 LAB — BAYER DCA HB A1C WAIVED: HB A1C (BAYER DCA - WAIVED): 5.8 % (ref <7.0)

## 2016-01-26 MED ORDER — OXYCODONE-ACETAMINOPHEN 10-325 MG PO TABS: 1.0000 | ORAL_TABLET | Freq: Four times a day (QID) | ORAL | Status: DC

## 2016-01-26 MED ORDER — ALPRAZOLAM 1 MG PO TABS: 1.0000 mg | ORAL_TABLET | Freq: Three times a day (TID) | ORAL | Status: AC

## 2016-01-26 MED ORDER — SPIRONOLACTONE 25 MG PO TABS: 25.0000 mg | ORAL_TABLET | Freq: Every day | ORAL | Status: AC

## 2016-01-26 MED ORDER — LUBIPROSTONE 24 MCG PO CAPS: 24.0000 ug | ORAL_CAPSULE | Freq: Two times a day (BID) | ORAL | Status: DC

## 2016-01-26 MED ORDER — LEVOCETIRIZINE DIHYDROCHLORIDE 5 MG PO TABS: 5.0000 mg | ORAL_TABLET | Freq: Every day | ORAL | Status: DC

## 2016-01-26 MED ORDER — HYDRALAZINE HCL 25 MG PO TABS: 50.0000 mg | ORAL_TABLET | Freq: Three times a day (TID) | ORAL | Status: AC

## 2016-01-26 MED ORDER — PANTOPRAZOLE SODIUM 40 MG PO TBEC: 40.0000 mg | DELAYED_RELEASE_TABLET | Freq: Two times a day (BID) | ORAL | Status: DC

## 2016-01-26 MED ORDER — GABAPENTIN 300 MG PO CAPS: 600.0000 mg | ORAL_CAPSULE | Freq: Two times a day (BID) | ORAL | Status: DC

## 2016-01-26 MED ORDER — LEVOTHYROXINE SODIUM 137 MCG PO TABS: 137.0000 ug | ORAL_TABLET | Freq: Every morning | ORAL | Status: AC

## 2016-01-26 MED ORDER — METOPROLOL SUCCINATE ER 25 MG PO TB24: 25.0000 mg | ORAL_TABLET | Freq: Two times a day (BID) | ORAL | Status: DC

## 2016-01-26 NOTE — Progress Notes (Signed)
Subjective:   Gregg Taylor is a 70 y.o. male who presents for an Initial Medicare Annual Wellness Visit.  Having left eye cataract surgery one week from today at [facility] with Dr. Asa Saunas. Will have right eye done on April 26.   Patient in for follow-up of atrial fibrillation. Patient denies any recent bouts of chest pain or palpitations. Additionally, patient is taking anticoagulants. Patient denies any recent excessive bleeding episodes including epistaxis, bleeding from the gums, genitalia, rectal bleeding or hematuria. Additionally there has been no excessive bruising.  Patient presents for follow-up on  thyroid. He has a history of hypothyroidism for many years. It has been stable recently. Pt. denies any change in  voice, loss of hair, heat or cold intolerance. Energy level has been adequate to good. She denies constipation and diarrhea. No myxedema. Medication is as noted below. Verified that pt is taking it daily on an empty stomach. Well tolerated.   follow-up of hypertension. Patient has no history of headache chest pain or shortness of breath or recent cough. Patient also denies symptoms of TIA such as numbness weakness lateralizing. Patient checks  blood pressure at home and has not had any elevated readings recently. Patient denies side effects from his medication. States taking it regularly.  Patient states he continues to have issues with back pain. Knees also hurt constantly. Both have led to his inability to ambulate adequately. He recently obtained a scooter which he is using for ambulation about the office today  Follow-up of diabetes. Patient does check blood sugar at home area most readings between 100 - 150 both fasting and postprandial. Patient denies symptoms such as polyuria, polydipsia, excessive hunger, nausea No significant hypoglycemic spells noted. Medications as noted below. Taking them regularly without complication/adverse reaction being reported today.    Recent cardiac workup with his cardiologist Dr. Kenton Kingfisher. Echocardiogram showed an ejection fraction of 40% which is a modest improvement from the past. EKG was unchanged. Patient also had a 2 day heart monitor. No new diagnoses made but atrial fibrillation apparently continues. Patient states he had some furosemide back to his regimen of medications and continues to take the bumetanide as well. He has very dry mouth at this point.  Review of Systems  Review of Systems  Constitutional: Negative for fever, chills, weight loss, malaise/fatigue and diaphoresis.  HENT: Negative for congestion, ear pain, hearing loss, nosebleeds, sore throat and tinnitus.   Eyes: Negative for blurred vision, double vision, photophobia, pain, discharge and redness.  Respiratory: Negative for cough, hemoptysis, sputum production, shortness of breath and wheezing.   Cardiovascular: Negative for chest pain, palpitations, orthopnea, leg swelling and PND.  Gastrointestinal: Negative for heartburn, nausea, vomiting, abdominal pain, diarrhea, constipation, blood in stool and melena.  Genitourinary: Negative for hematuria and flank pain.  Musculoskeletal: Positive for myalgias, back pain, joint pain and neck pain. Negative for falls.  Skin: Negative for itching and rash.  Neurological: Negative for dizziness, tingling, tremors, sensory change, speech change, focal weakness, seizures, loss of consciousness, weakness and headaches.  Endo/Heme/Allergies: Negative for environmental allergies and polydipsia. Bruises/bleeds easily.  Psychiatric/Behavioral: Negative for depression, suicidal ideas, hallucinations, memory loss and substance abuse. The patient is not nervous/anxious and does not have insomnia.         Current Medications (verified) Outpatient Encounter Prescriptions as of 01/26/2016  Medication Sig  . ALPRAZolam (XANAX) 1 MG tablet Take 1 tablet (1 mg total) by mouth 3 (three) times daily.  . Ascorbic Acid (VITAMIN  C)  1000 MG tablet Take 1,000 mg by mouth.  Marland Kitchen aspirin 81 MG chewable tablet Chew 81 mg by mouth daily.   . blood glucose meter kit and supplies KIT Dispense based on patient and insurance preference. Use up to four times daily as directed. (FOR ICD-9 250.00, 250.01).  . bumetanide (BUMEX) 2 MG tablet Take 1 tablet by mouth 2 (two) times daily.  . Cholecalciferol (VITAMIN D3) 2000 UNITS TABS Take 2,000 Units by mouth.  . escitalopram (LEXAPRO) 20 MG tablet Take 1 tablet (20 mg total) by mouth daily.  Marland Kitchen gabapentin (NEURONTIN) 300 MG capsule TAKE TWO CAPSULES BY MOUTH TWICE DAILY  . hydrALAZINE (APRESOLINE) 25 MG tablet Take 2 tablets (50 mg total) by mouth 3 (three) times daily.  Marland Kitchen levocetirizine (XYZAL) 5 MG tablet Take 1 tablet (5 mg total) by mouth daily.  Marland Kitchen levothyroxine (SYNTHROID, LEVOTHROID) 137 MCG tablet TAKE ONE TABLET BY MOUTH EVERY MORNING  . lubiprostone (AMITIZA) 24 MCG capsule Take 1 capsule (24 mcg total) by mouth 2 (two) times daily with a meal. To prevent constipation  . metoprolol succinate (TOPROL-XL) 25 MG 24 hr tablet Take 1 tablet (25 mg total) by mouth 2 (two) times daily.  . mirtazapine (REMERON) 30 MG tablet Take 1 tablet (30 mg total) by mouth at bedtime. For sleep and depression  . Multiple Vitamin (MULTIVITAMIN) tablet Take 1 tablet by mouth.  . OXYGEN Inhale 2 L into the lungs.  . pantoprazole (PROTONIX) 40 MG tablet Take 1 tablet by mouth 2 (two) times daily.  . potassium chloride (K-DUR) 10 MEQ tablet Take 4 tablets (40 mEq total) by mouth 2 (two) times daily.  . quinapril (ACCUPRIL) 40 MG tablet Take 20 mg by mouth 2 (two) times daily.   Marland Kitchen Respiratory Therapy Supplies MISC CPAP at HS.  . simvastatin (ZOCOR) 20 MG tablet Take 1 tablet by mouth at bedtime.  Marland Kitchen spironolactone (ALDACTONE) 25 MG tablet Take 1 tablet (25 mg total) by mouth daily.  . temazepam (RESTORIL) 30 MG capsule Take 1 capsule (30 mg total) by mouth at bedtime.  . vitamin B-12 (CYANOCOBALAMIN) 500  MCG tablet Take 500 mcg by mouth.  . warfarin (COUMADIN) 4 MG tablet Take 1 tablet (4 mg total) by mouth daily. Along with 1 tablet of 68m for a total dose of 945monce daily  . warfarin (COUMADIN) 5 MG tablet Take 1 tablet (5 mg total) by mouth daily. Along with 66m92mablet for total dose of 9mg32mtal.  . [DISCONTINUED] ALPRAZolam (XANAX) 1 MG tablet Take 1 tablet (1 mg total) by mouth 3 (three) times daily.  . [DISCONTINUED] lubiprostone (AMITIZA) 24 MCG capsule Take 1 capsule (24 mcg total) by mouth 2 (two) times daily with a meal. For constipation  . [DISCONTINUED] Oxycodone HCl 10 MG TABS Take 1 tablet (10 mg total) by mouth 4 (four) times daily as needed (for severe pain only).  . [DISCONTINUED] Oxycodone HCl 10 MG TABS Take 1 tablet (10 mg total) by mouth every 6 (six) hours as needed.  . [DISCONTINUED] Oxycodone HCl 10 MG TABS Take 1 tablet (10 mg total) by mouth 4 (four) times daily.  . oxMarland KitchenCODONE-acetaminophen (PERCOCET) 10-325 MG tablet Take 1 tablet by mouth 4 (four) times daily.  . oxMarland KitchenCODONE-acetaminophen (PERCOCET) 10-325 MG tablet Take 1 tablet by mouth 4 (four) times daily.  . oxMarland KitchenCODONE-acetaminophen (PERCOCET) 10-325 MG tablet Take 1 tablet by mouth 4 (four) times daily.   No facility-administered encounter medications on file as of 01/26/2016.  Allergies (verified) Bystolic; Bupropion; and Celebrex   History: Past Medical History  Diagnosis Date  . Hypertension   . Hyperlipidemia   . Arthritis   . Depression   . CHF (congestive heart failure) ([place])   . Cancer (HCC)     lymph nodes  . Diabetes mellitus without complication [facility])    Past Surgical History  Procedure Laterality Date  . Appendectomy    . Cholecystectomy    . Gastric bypass  1991  . Fracture surgery      right wrist   Family History  Problem Relation Age of Onset  . Cancer Mother     ?bladder and bone  . Heart disease Father   . Hypertension Father   . Depression Father   . Alcohol abuse Father    . Hypertension Brother   . Depression Brother   . Stroke Brother    Social History   Occupational History  . Not on file.   Social History Main Topics  . Smoking status: Former Smoker -- 0.50 packs/day    Types: Cigarettes    Start date: 03/07/1978    Quit date: 03/08/2003  . Smokeless tobacco: Never Used  . Alcohol Use: No  . Drug Use: No  . Sexual Activity: No    Do you feel safe at home?  Yes  Dietary issues and exercise activities discussed: Continue weight loss efforts and salt avoidance. Moderate Vitamin K foods       Objective:    Today's Vitals   01/26/16 1104  BP: 131/74  Pulse: 94  Temp: 97.2 F (36.2 C)  TempSrc: Oral  Height: 5' 8" (1.727 m)  Weight: 405 lb (183.707 kg)  SpO2: 96%   Body mass index is 61.59 kg/(m^2).   Activities of Daily Living In your present state of health, do you have any difficulty performing the following activities: 07/08/2015  Hearing? Y  Vision? Y  Difficulty concentrating or making decisions? N  Walking or climbing stairs? Y  Dressing or bathing? N  Doing errands, shopping? N  Preparing Food and eating ? N  Using the Toilet? N  In the past six months, have you accidently leaked urine? N  Do you have problems with loss of bowel control? N  Managing your Medications? N  Managing your Finances? N  Housekeeping or managing your Housekeeping? N    Are there smokers in your home (other than you)?no   Depression Screen PHQ 2/9 Scores 01/26/2016 10/22/2015 07/08/2015 02/19/2015  PHQ - 2 Score 0 0 2 6  PHQ- 9 Score - - 6 18    Fall Risk Fall Risk  01/26/2016 10/22/2015 07/08/2015 05/28/2015 02/19/2015  Falls in the past year? No Yes Yes Yes Yes  Number falls in past yr: - - 2 or more 2 or more 1  Injury with Fall? - - No No No  Risk Factor Category  - - High Fall Risk - -  Risk for fall due to : - - History of fall(s);Other (Comment);Medication side effect - -  Follow up - - Falls prevention discussed - -    Cognitive  Function: MMSE - Mini Mental State Exam 07/08/2015 07/08/2015  Orientation to time 5 5  Orientation to Place 5 5  Registration 3 3  Attention/ Calculation 5 5  Recall 3 3  Language- name 2 objects 2 2  Language- repeat 1 1  Language- follow 3 step command 3 3  Language- read & follow direction 1 1  Write  a sentence 1 -  Copy design 1 -  Total score 30 -   Physical Exam  Constitutional: He is oriented to person, place, and time. He appears well-developed. No distress.  Morbidly obese, hx of failed gastric bypass, remote past  HENT:  Head: Normocephalic and atraumatic.  Right Ear: External ear normal.  Left Ear: External ear normal.  Nose: Nose normal.  Mouth/Throat: Oropharynx is clear and moist.  Eyes: Conjunctivae and EOM are normal. Pupils are equal, round, and reactive to light. Right eye exhibits no discharge. Left eye exhibits no discharge.  Neck: Normal range of motion. Neck supple. No JVD present. No thyromegaly present.  Cardiovascular: Normal rate, regular rhythm and normal heart sounds.  Exam reveals no gallop and no friction rub.   No murmur heard. Pulmonary/Chest: Effort normal and breath sounds normal. No stridor. No respiratory distress. He has no wheezes. He has no rales. He exhibits no tenderness.  Abdominal: Soft. Bowel sounds are normal. He exhibits no distension and no mass. There is no tenderness. There is no rebound and no guarding. A hernia is present.  Genitourinary: Rectum normal, prostate normal and penis normal.  Lymphadenopathy:    He has no cervical adenopathy.  Neurological: He is alert and oriented to person, place, and time. He displays normal reflexes. No cranial nerve deficit. He exhibits normal muscle tone. Coordination normal.  Skin: Skin is warm and dry. No rash noted. No erythema.  Psychiatric: He has a normal mood and affect. His behavior is normal. Judgment and thought content normal.  Vitals reviewed.   Immunizations and Health  Maintenance Immunization History  Administered Date(s) Administered  . Influenza,inj,Quad PF,36+ Mos 08/03/2015   Health Maintenance Due  Topic Date Due  . Hepatitis C Screening  1946/09/03  . PNA vac Low Risk Adult (2 of 2 - PPSV23) 10/07/2015    Patient Care Team: Claretta Fraise, MD as PCP - General (Family Medicine) Raj Janus, MD as Consulting Physician (Cardiology) Valeda Malm, MD as Consulting Physician (Cardiology) Sharol Given, MD as Consulting Physician (Otolaryngology) Benay Spice, MD as Physician Assistant (Podiatry) Eulis Canner as Consulting Physician (Internal Medicine)  Indicate any recent Medical Services you may have received from other than [facility] providers in the past year (date may be approximate).    Assessment:    Annual Wellness Visit  1. Chronic atrial fibrillation (HCC) Continue monthly INR, Reviewed foods with Vit. K, be moderate and consistent. Green veggies are good for you! - CBC with Differential/Platelet - CMP14+EGFR - CoaguChek XS/INR Waived  2. Medicare annual wellness visit, subsequent - Hepatitis c antibody (reflex) - HIV antibody  See goals  3. Diabetic polyneuropathy associated with type 2 diabetes mellitus ([place]) Stable home glucose readings. Diabetic diet crb control reviewed - CBC with Differential/Platelet - CMP14+EGFR - Bayer DCA Hb A1c Waived  4. Congestive dilated cardiomyopathy ([place]) Continues to follow regularly with cardiology, Dr. Kenton Kingfisher. - CBC with Differential/Platelet - CMP14+EGFR  5. Hypothyroidism due to acquired atrophy of thyroid   TSH, Free T4 - CBC with Differential/Platelet - CMP14+EGFR  6. Arthritis of knee  continues to limit mobility. Continue Range of motion exercises. Also, this contributes to his opiate dependence - CBC with Differential/Platelet - CMP14+EGFR  7. Hyperlipidemia  - CMP14+EGFR - Lipid panel - TSH  8. Morbid obesity, unspecified obesity type ([place]) Weght control -  exercise as condition allows _0  9. Lumbar herniated disc Continue pain meds. Pain agreement reviewed. Pt. Asked to add acetaminophen to  each dose 10. Right lumbar radiculopathy Pain mgmt here.  11. OSA (obstructive sleep apnea) Letter to authorize a new CPAP needed  12. Opioid type dependence, continuous (HCC) Continue to monitor pain level, response  13. Benzodiazepine dependence ([place]) Unable to wean due to long term (25 years) dependence as well as overall condition. Too dangerous.  - CMP14+EGFR  14. Screening for prostate cancer - PSA Total (Reflex To Free)  15. Screen for colon cancer - Fecal occult blood, imunochemical  16. Encounter for long-term (current) use of high-risk medication  Check INR at home monthly - CoaguChek XS/INR Waived, 2.5 today. Continue coumadin as is, 9 mg daily  17. Vitamin D deficiency  - VITAMIN D 25 Hydroxy (Vit-D Deficiency, Fractures)  18. Cataract cortical, senile, bilateral Planning surgical lens implant and catart removal OS on 3/29 and OD on 4/26 Medically cleared today pending opinion from Dr. Hilliard Clark regarding anticoagulation. I believe he is at mild risk for thrombotic event if off of med over 5 -7 days.   Screening Tests Health Maintenance  Topic Date Due  . Hepatitis C Screening  [birth year]  . PNA vac Low Risk Adult (2 of 2 - PPSV23) 10/07/2015  . HEMOGLOBIN A1C  04/21/2016  . INFLUENZA VACCINE  06/06/2016  . FOOT EXAM  10/21/2016  . OPHTHALMOLOGY EXAM  01/04/2017  . COLONOSCOPY  11/29/2023  . TETANUS/TDAP  02/19/2024  . ZOSTAVAX  Completed        Plan:   During the course of the visit Ronnald was educated and counseled about the following appropriate screening and preventive services:   Vaccines to include Pneumoccal, Influenza, Hepatitis B, Td, Zostavax,  Colorectal cancer screening, rectal exam with IFOB done today. Colonoscopy currently up-to-date.  Cardiovascular disease screening, see history of present illness  this was recently performed by Dr. Nicholes Mango  Diabetes screening, A1c ordered  Bone Denisty / Osteoporosis Screening  Glaucoma screening / Diabetic Eye Exam, up-to-date report needed  Nutrition counseling  Prostate cancer screening, rectal exam with prostate evaluation and PSA performed today  Smoking cessation counseling, nonsmoker   Goals    . Prevent Falls     Pt. Has a walker and uses a scooter. Make sure to make transfers carefully. Use scooter most of the time, but walk for exercise as much as possible. Avoid uneven surfaces, rugs and other falling hazards    . Reduce sodium intake     Follow DASH program, but remember greens can increase Vitamin K     . Weight < 200 lb (90.719 kg)     Reduce calories to 1200 daily. Emphasize protein foods.         Patient Instructions (the written plan) were given to the patient.   Claretta Fraise, MD   01/26/2016

## 2016-01-26 NOTE — Addendum Note (Signed)
Addended by: Marin Olp on: 01/26/2016 05:06 PM   Modules accepted: Orders, Medications, SmartSet

## 2016-01-27 ENCOUNTER — Telehealth: Payer: Self-pay | Admitting: Family Medicine

## 2016-01-27 DIAGNOSIS — M5441 Lumbago with sciatica, right side: Secondary | ICD-10-CM | POA: Diagnosis not present

## 2016-01-27 DIAGNOSIS — M9904 Segmental and somatic dysfunction of sacral region: Secondary | ICD-10-CM | POA: Diagnosis not present

## 2016-01-27 DIAGNOSIS — M9903 Segmental and somatic dysfunction of lumbar region: Secondary | ICD-10-CM | POA: Diagnosis not present

## 2016-01-27 DIAGNOSIS — M5137 Other intervertebral disc degeneration, lumbosacral region: Secondary | ICD-10-CM | POA: Diagnosis not present

## 2016-01-27 DIAGNOSIS — M9901 Segmental and somatic dysfunction of cervical region: Secondary | ICD-10-CM | POA: Diagnosis not present

## 2016-01-27 LAB — CBC WITH DIFFERENTIAL/PLATELET
BASOS: 0 %
Basophils Absolute: 0 10*3/uL (ref 0.0–0.2)
EOS (ABSOLUTE): 0.1 10*3/uL (ref 0.0–0.4)
EOS: 2 %
HEMATOCRIT: 37 % — AB (ref 37.5–51.0)
HEMOGLOBIN: 12 g/dL — AB (ref 12.6–17.7)
IMMATURE GRANULOCYTES: 1 %
Immature Grans (Abs): 0 10*3/uL (ref 0.0–0.1)
LYMPHS ABS: 1.2 10*3/uL (ref 0.7–3.1)
Lymphs: 22 %
MCH: 28.2 pg (ref 26.6–33.0)
MCHC: 32.4 g/dL (ref 31.5–35.7)
MCV: 87 fL (ref 79–97)
MONOCYTES: 9 %
MONOS ABS: 0.5 10*3/uL (ref 0.1–0.9)
Neutrophils Absolute: 3.8 10*3/uL (ref 1.4–7.0)
Neutrophils: 66 %
Platelets: 305 10*3/uL (ref 150–379)
RBC: 4.26 x10E6/uL (ref 4.14–5.80)
RDW: 16 % — AB (ref 12.3–15.4)
WBC: 5.7 10*3/uL (ref 3.4–10.8)

## 2016-01-27 LAB — HEPATITIS C ANTIBODY (REFLEX): HCV Ab: <0.1 s/co ratio (ref 0.0–0.9)

## 2016-01-27 LAB — LIPID PANEL
CHOL/HDL RATIO: 1.7 ratio (ref 0.0–5.0)
CHOLESTEROL TOTAL: 139 mg/dL (ref 100–199)
HDL: 82 mg/dL (ref >39)
LDL Calculated: 44 mg/dL (ref 0–99)
Triglycerides: 66 mg/dL (ref 0–149)
VLDL Cholesterol Cal: 13 mg/dL (ref 5–40)

## 2016-01-27 LAB — CMP14+EGFR
A/G RATIO: 1.4 (ref 1.2–2.2)
ALBUMIN: 3.6 g/dL (ref 3.6–4.8)
ALT: 14 IU/L (ref 0–44)
AST: 18 IU/L (ref 0–40)
Alkaline Phosphatase: 86 IU/L (ref 39–117)
BUN / CREAT RATIO: 13 (ref 10–22)
BUN: 14 mg/dL (ref 8–27)
Bilirubin Total: 0.2 mg/dL (ref 0.0–1.2)
CALCIUM: 8.7 mg/dL (ref 8.6–10.2)
CO2: 28 mmol/L (ref 18–29)
CREATININE: 1.04 mg/dL (ref 0.76–1.27)
Chloride: 102 mmol/L (ref 96–106)
GFR, EST AFRICAN AMERICAN: 84 mL/min/{1.73_m2} (ref >59)
GFR, EST NON AFRICAN AMERICAN: 73 mL/min/{1.73_m2} (ref >59)
GLOBULIN, TOTAL: 2.6 g/dL (ref 1.5–4.5)
Glucose: 103 mg/dL — ABNORMAL HIGH (ref 65–99)
POTASSIUM: 4.5 mmol/L (ref 3.5–5.2)
SODIUM: 147 mmol/L — AB (ref 134–144)
Total Protein: 6.2 g/dL (ref 6.0–8.5)

## 2016-01-27 LAB — TSH+FREE T4
FREE T4: 1.41 ng/dL (ref 0.82–1.77)
TSH: 3.2 u[IU]/mL (ref 0.450–4.500)

## 2016-01-27 LAB — VITAMIN D 25 HYDROXY (VIT D DEFICIENCY, FRACTURES): VIT D 25 HYDROXY: 33.2 ng/mL (ref 30.0–100.0)

## 2016-01-27 LAB — PSA TOTAL (REFLEX TO FREE): PROSTATE SPECIFIC AG, SERUM: 0.7 ng/mL (ref 0.0–4.0)

## 2016-01-27 LAB — HCV COMMENT:

## 2016-01-27 LAB — HIV ANTIBODY (ROUTINE TESTING W REFLEX): HIV SCREEN 4TH GENERATION: NONREACTIVE

## 2016-01-28 ENCOUNTER — Other Ambulatory Visit: Payer: Self-pay | Admitting: Family Medicine

## 2016-01-28 ENCOUNTER — Telehealth: Payer: Self-pay | Admitting: Pharmacist

## 2016-01-28 MED ORDER — BUMETANIDE 2 MG PO TABS: 4.0000 mg | ORAL_TABLET | Freq: Two times a day (BID) | ORAL | Status: DC

## 2016-01-28 MED ORDER — TEMAZEPAM 22.5 MG PO CAPS: 45.0000 mg | ORAL_CAPSULE | Freq: Every day | ORAL | Status: DC

## 2016-01-28 NOTE — Telephone Encounter (Signed)
Called patient - he was expecting 3 Rx's that he has not received yet.  1 - bumetanide was expecting increase in dose and new rx 2- mirtazepine 81m - was expecting increase to 2 tablets qhs and new rx.  3 - it appears Amitiza needs PA - will check to see if Debi has received PA request.   Patient saw PCP 01/26/16 will forward

## 2016-01-29 LAB — FECAL OCCULT BLOOD, IMMUNOCHEMICAL: FECAL OCCULT BLD: NEGATIVE

## 2016-01-30 LAB — TOXASSURE SELECT 13 (MW), URINE: PDF: 0

## 2016-01-31 LAB — PROTIME-INR: INR: 3 — AB (ref 0.9–1.1)

## 2016-02-01 ENCOUNTER — Telehealth: Payer: Self-pay | Admitting: Family Medicine

## 2016-02-02 DIAGNOSIS — Z7901 Long term (current) use of anticoagulants: Secondary | ICD-10-CM | POA: Diagnosis not present

## 2016-02-02 DIAGNOSIS — K219 Gastro-esophageal reflux disease without esophagitis: Secondary | ICD-10-CM | POA: Diagnosis not present

## 2016-02-02 DIAGNOSIS — N189 Chronic kidney disease, unspecified: Secondary | ICD-10-CM | POA: Diagnosis not present

## 2016-02-02 DIAGNOSIS — F419 Anxiety disorder, unspecified: Secondary | ICD-10-CM | POA: Diagnosis not present

## 2016-02-02 DIAGNOSIS — H2512 Age-related nuclear cataract, left eye: Secondary | ICD-10-CM | POA: Diagnosis not present

## 2016-02-02 DIAGNOSIS — J449 Chronic obstructive pulmonary disease, unspecified: Secondary | ICD-10-CM | POA: Diagnosis not present

## 2016-02-02 DIAGNOSIS — Z6841 Body Mass Index (BMI) 40.0 and over, adult: Secondary | ICD-10-CM | POA: Diagnosis not present

## 2016-02-02 DIAGNOSIS — I129 Hypertensive chronic kidney disease with stage 1 through stage 4 chronic kidney disease, or unspecified chronic kidney disease: Secondary | ICD-10-CM | POA: Diagnosis not present

## 2016-02-02 DIAGNOSIS — H2513 Age-related nuclear cataract, bilateral: Secondary | ICD-10-CM | POA: Diagnosis not present

## 2016-02-02 DIAGNOSIS — G4730 Sleep apnea, unspecified: Secondary | ICD-10-CM | POA: Diagnosis not present

## 2016-02-02 DIAGNOSIS — Z87891 Personal history of nicotine dependence: Secondary | ICD-10-CM | POA: Diagnosis not present

## 2016-02-02 DIAGNOSIS — H16223 Keratoconjunctivitis sicca, not specified as Sjogren's, bilateral: Secondary | ICD-10-CM | POA: Diagnosis not present

## 2016-02-02 DIAGNOSIS — Z8711 Personal history of peptic ulcer disease: Secondary | ICD-10-CM | POA: Diagnosis not present

## 2016-02-02 DIAGNOSIS — E1136 Type 2 diabetes mellitus with diabetic cataract: Secondary | ICD-10-CM | POA: Diagnosis not present

## 2016-02-02 DIAGNOSIS — I509 Heart failure, unspecified: Secondary | ICD-10-CM | POA: Diagnosis not present

## 2016-02-02 DIAGNOSIS — Z79899 Other long term (current) drug therapy: Secondary | ICD-10-CM | POA: Diagnosis not present

## 2016-02-02 DIAGNOSIS — E1122 Type 2 diabetes mellitus with diabetic chronic kidney disease: Secondary | ICD-10-CM | POA: Diagnosis not present

## 2016-02-02 DIAGNOSIS — E039 Hypothyroidism, unspecified: Secondary | ICD-10-CM | POA: Diagnosis not present

## 2016-02-02 DIAGNOSIS — E785 Hyperlipidemia, unspecified: Secondary | ICD-10-CM | POA: Diagnosis not present

## 2016-02-03 ENCOUNTER — Other Ambulatory Visit: Payer: Self-pay | Admitting: Family Medicine

## 2016-02-03 ENCOUNTER — Telehealth: Payer: Self-pay | Admitting: Pharmacist

## 2016-02-03 ENCOUNTER — Encounter: Payer: Self-pay | Admitting: *Deleted

## 2016-02-03 DIAGNOSIS — M9901 Segmental and somatic dysfunction of cervical region: Secondary | ICD-10-CM | POA: Diagnosis not present

## 2016-02-03 DIAGNOSIS — H16223 Keratoconjunctivitis sicca, not specified as Sjogren's, bilateral: Secondary | ICD-10-CM | POA: Diagnosis not present

## 2016-02-03 DIAGNOSIS — M5137 Other intervertebral disc degeneration, lumbosacral region: Secondary | ICD-10-CM | POA: Diagnosis not present

## 2016-02-03 DIAGNOSIS — M5441 Lumbago with sciatica, right side: Secondary | ICD-10-CM | POA: Diagnosis not present

## 2016-02-03 DIAGNOSIS — M9904 Segmental and somatic dysfunction of sacral region: Secondary | ICD-10-CM | POA: Diagnosis not present

## 2016-02-03 DIAGNOSIS — M9903 Segmental and somatic dysfunction of lumbar region: Secondary | ICD-10-CM | POA: Diagnosis not present

## 2016-02-03 DIAGNOSIS — H2511 Age-related nuclear cataract, right eye: Secondary | ICD-10-CM | POA: Diagnosis not present

## 2016-02-04 ENCOUNTER — Telehealth: Payer: Self-pay | Admitting: Family Medicine

## 2016-02-04 NOTE — Telephone Encounter (Signed)
See previous call notes from 02/03/16

## 2016-02-04 NOTE — Telephone Encounter (Signed)
Patient states that bumetanide was suppose to be increased last week after visit with Dr Livia Snellen - looks like Rx was sent in 02/03/2016 with increase dose to 2 tablets bid.  Patient also states he was instructed to take mirtazapine 72m 2 tablets at bedtime. I reviewed med list and it looks like mirtazapine was not increase but instead temazepam was changed from 3345m1 qhs to 22.45m245make 2 qhs.  Rx was already sent in.  Patient to check with pharmacy.  Patient also asks about Amitiza - this required PA - I checked with his pharmacy and still requires PA.  Our PA specialist is not here today so I do not know if PA has been attempted.  I asked pharmacy to refax PA information and will see what I can do.

## 2016-02-07 ENCOUNTER — Other Ambulatory Visit: Payer: Self-pay | Admitting: Family Medicine

## 2016-02-07 ENCOUNTER — Encounter: Payer: Self-pay | Admitting: Family Medicine

## 2016-02-07 DIAGNOSIS — I482 Chronic atrial fibrillation: Secondary | ICD-10-CM | POA: Diagnosis not present

## 2016-02-07 DIAGNOSIS — Z7901 Long term (current) use of anticoagulants: Secondary | ICD-10-CM | POA: Diagnosis not present

## 2016-02-07 LAB — POCT INR: INR: 4.1

## 2016-02-08 ENCOUNTER — Telehealth: Payer: Self-pay

## 2016-02-08 NOTE — Telephone Encounter (Signed)
Insurance prior authorized [employer]

## 2016-02-09 ENCOUNTER — Other Ambulatory Visit: Payer: Self-pay | Admitting: *Deleted

## 2016-02-09 DIAGNOSIS — E1136 Type 2 diabetes mellitus with diabetic cataract: Secondary | ICD-10-CM | POA: Diagnosis not present

## 2016-02-09 DIAGNOSIS — Z79899 Other long term (current) drug therapy: Secondary | ICD-10-CM | POA: Diagnosis not present

## 2016-02-09 DIAGNOSIS — Z87891 Personal history of nicotine dependence: Secondary | ICD-10-CM | POA: Diagnosis not present

## 2016-02-09 DIAGNOSIS — I10 Essential (primary) hypertension: Secondary | ICD-10-CM | POA: Diagnosis not present

## 2016-02-09 DIAGNOSIS — E785 Hyperlipidemia, unspecified: Secondary | ICD-10-CM | POA: Diagnosis not present

## 2016-02-09 DIAGNOSIS — H2511 Age-related nuclear cataract, right eye: Secondary | ICD-10-CM | POA: Diagnosis not present

## 2016-02-09 DIAGNOSIS — H16223 Keratoconjunctivitis sicca, not specified as Sjogren's, bilateral: Secondary | ICD-10-CM | POA: Diagnosis not present

## 2016-02-09 DIAGNOSIS — F329 Major depressive disorder, single episode, unspecified: Secondary | ICD-10-CM | POA: Diagnosis not present

## 2016-02-09 DIAGNOSIS — E039 Hypothyroidism, unspecified: Secondary | ICD-10-CM | POA: Diagnosis not present

## 2016-02-09 DIAGNOSIS — J449 Chronic obstructive pulmonary disease, unspecified: Secondary | ICD-10-CM | POA: Diagnosis not present

## 2016-02-09 MED ORDER — MIRTAZAPINE 30 MG PO TABS: ORAL_TABLET | ORAL | Status: DC

## 2016-02-09 NOTE — Telephone Encounter (Signed)
Patient says you were suppose to send in a new script for Remeron ,taking 2 pills at bedtime.  He has been taking 2 nightly and sleeping much better.  He also wants you to write a letter to the postman stating he needs the mailbox close to house because he has difficulty walking.

## 2016-02-09 NOTE — Telephone Encounter (Signed)
Send in scrip for mirtazwpine 30 mg, 2 qhs, #60, 2 refill

## 2016-02-09 NOTE — Telephone Encounter (Signed)
Lmtcb.

## 2016-02-09 NOTE — Telephone Encounter (Signed)
The requested med has been sent to the pharmacy.  Please let the patient know. Thanks, WS I had increased the temazepam. If he prefers remeron and it is working, that's fine I will send. Please write the requesteds letter and I wil sign.

## 2016-02-09 NOTE — Telephone Encounter (Signed)
amitiza authorized

## 2016-02-10 ENCOUNTER — Ambulatory Visit: Payer: Self-pay | Admitting: Pharmacist

## 2016-02-10 ENCOUNTER — Telehealth: Payer: Self-pay | Admitting: Pharmacist

## 2016-02-10 DIAGNOSIS — I482 Chronic atrial fibrillation, unspecified: Secondary | ICD-10-CM

## 2016-02-10 NOTE — Telephone Encounter (Signed)
Patient states that cost of new temazepam strength is too high (cost was over $100 / 1 month supply) he has accidentally doubled the mirtazapine dose to 48m and this worked well for sleep.  I told him the max dose is 493mof mirtazapine and he asked if could increase.  Will check with his PCP for decision on what is best course of action.   Also we have [facility] on [facility] ([phone]).  [facility] fax for office from [phone] to [phone]. Verified they were faxing weekly but unsure why we have not received results.

## 2016-02-11 ENCOUNTER — Telehealth: Payer: Self-pay | Admitting: Pharmacist

## 2016-02-11 MED ORDER — TEMAZEPAM 30 MG PO CAPS: 30.0000 mg | ORAL_CAPSULE | Freq: Every day | ORAL | Status: DC

## 2016-02-11 NOTE — Telephone Encounter (Signed)
Pt notified of letter Rosezena Sensor understanding

## 2016-02-11 NOTE — Telephone Encounter (Signed)
Patient called about a letter.  He thinks that someone called and left a letter for him to give to post office requesting that his mailbox be moved  Closer to his house.  I was unable to find the letter here.  Will forward to PCP's nurse to see if she knows about this letter.

## 2016-02-11 NOTE — Telephone Encounter (Signed)
Okay to increase mirtazepine to 60 mg. PLease tell pt. Also I will decrease the temazepam to 30 mg again

## 2016-02-14 ENCOUNTER — Ambulatory Visit: Payer: Self-pay | Admitting: *Deleted

## 2016-02-14 ENCOUNTER — Telehealth: Payer: Self-pay | Admitting: *Deleted

## 2016-02-14 LAB — PROTIME-INR

## 2016-02-14 MED ORDER — MIRTAZAPINE 30 MG PO TABS: ORAL_TABLET | ORAL | Status: AC

## 2016-02-14 NOTE — Telephone Encounter (Signed)
Cut copumadin dose in half today, recheck tomorrow

## 2016-02-14 NOTE — Telephone Encounter (Signed)
Left message, please call.

## 2016-02-14 NOTE — Telephone Encounter (Signed)
Aware of change in medication dose per provider.

## 2016-02-14 NOTE — Telephone Encounter (Signed)
Pt notified of recommendation Verbalizes understanding

## 2016-02-14 NOTE — Telephone Encounter (Signed)
Patients INR 3.9

## 2016-02-15 ENCOUNTER — Telehealth: Payer: Self-pay | Admitting: *Deleted

## 2016-02-15 NOTE — Telephone Encounter (Signed)
Reduce to 6 mg daily

## 2016-02-15 NOTE — Telephone Encounter (Signed)
Spoke with pt regarding Coumadin He is currently taking 7 mg daily What dose do you recommend Please review and advise

## 2016-02-15 NOTE — Telephone Encounter (Signed)
Patient repeated INR today and it was: 3.0

## 2016-02-15 NOTE — Telephone Encounter (Signed)
Pt notified of recommendation Verbalizes understanding

## 2016-02-15 NOTE — Telephone Encounter (Signed)
Reduce coumadin to 8 mg (41mX 2 tabs) daily. Recheck in 2 days

## 2016-02-16 NOTE — Telephone Encounter (Signed)
Spoke with pt regarding meds Also spoke to pharmacy to clarify

## 2016-02-18 LAB — POCT INR: INR: 2.3

## 2016-02-21 ENCOUNTER — Ambulatory Visit (INDEPENDENT_AMBULATORY_CARE_PROVIDER_SITE_OTHER): Payer: Self-pay | Admitting: Pharmacist

## 2016-02-21 DIAGNOSIS — I482 Chronic atrial fibrillation, unspecified: Secondary | ICD-10-CM

## 2016-02-22 DIAGNOSIS — I509 Heart failure, unspecified: Secondary | ICD-10-CM | POA: Diagnosis not present

## 2016-02-22 DIAGNOSIS — N390 Urinary tract infection, site not specified: Secondary | ICD-10-CM | POA: Diagnosis not present

## 2016-02-22 DIAGNOSIS — K253 Acute gastric ulcer without hemorrhage or perforation: Secondary | ICD-10-CM | POA: Diagnosis not present

## 2016-02-22 DIAGNOSIS — R1310 Dysphagia, unspecified: Secondary | ICD-10-CM | POA: Diagnosis not present

## 2016-02-23 ENCOUNTER — Encounter: Payer: Self-pay | Admitting: Family Medicine

## 2016-02-24 DIAGNOSIS — M7542 Impingement syndrome of left shoulder: Secondary | ICD-10-CM | POA: Diagnosis not present

## 2016-02-24 DIAGNOSIS — M170 Bilateral primary osteoarthritis of knee: Secondary | ICD-10-CM | POA: Diagnosis not present

## 2016-02-28 LAB — POCT INR: INR: 1.9

## 2016-02-29 ENCOUNTER — Ambulatory Visit (INDEPENDENT_AMBULATORY_CARE_PROVIDER_SITE_OTHER): Payer: Self-pay | Admitting: Pharmacist

## 2016-02-29 DIAGNOSIS — I482 Chronic atrial fibrillation, unspecified: Secondary | ICD-10-CM

## 2016-02-29 MED ORDER — WARFARIN SODIUM 2 MG PO TABS: ORAL_TABLET | ORAL | Status: DC

## 2016-02-29 NOTE — Progress Notes (Signed)
No charge for labs or visit - INR check through home monitoring system  

## 2016-02-29 NOTE — Addendum Note (Signed)
Addended by: Cherre Robins on: 02/29/2016 11:10 AM   Modules accepted: Orders

## 2016-03-06 ENCOUNTER — Other Ambulatory Visit: Payer: Self-pay | Admitting: Family Medicine

## 2016-03-06 DIAGNOSIS — I482 Chronic atrial fibrillation: Secondary | ICD-10-CM | POA: Diagnosis not present

## 2016-03-06 DIAGNOSIS — Z7901 Long term (current) use of anticoagulants: Secondary | ICD-10-CM | POA: Diagnosis not present

## 2016-03-06 LAB — POCT INR: INR: 2.5

## 2016-03-08 ENCOUNTER — Telehealth: Payer: Self-pay | Admitting: *Deleted

## 2016-03-08 ENCOUNTER — Other Ambulatory Visit: Payer: Self-pay | Admitting: Family Medicine

## 2016-03-08 NOTE — Telephone Encounter (Signed)
Received INR from [facility]  Result INR 2.5 Pt notified to continue current dose per Dr Lisette Abu understanding

## 2016-03-09 DIAGNOSIS — E1151 Type 2 diabetes mellitus with diabetic peripheral angiopathy without gangrene: Secondary | ICD-10-CM | POA: Diagnosis not present

## 2016-03-09 DIAGNOSIS — L600 Ingrowing nail: Secondary | ICD-10-CM | POA: Diagnosis not present

## 2016-03-13 ENCOUNTER — Ambulatory Visit (INDEPENDENT_AMBULATORY_CARE_PROVIDER_SITE_OTHER): Payer: Self-pay | Admitting: Pharmacist

## 2016-03-13 DIAGNOSIS — I482 Chronic atrial fibrillation, unspecified: Secondary | ICD-10-CM

## 2016-03-13 LAB — POCT INR: INR: 3

## 2016-03-20 ENCOUNTER — Ambulatory Visit (INDEPENDENT_AMBULATORY_CARE_PROVIDER_SITE_OTHER): Payer: Self-pay | Admitting: Pharmacist

## 2016-03-20 DIAGNOSIS — I482 Chronic atrial fibrillation, unspecified: Secondary | ICD-10-CM

## 2016-03-20 LAB — POCT INR: INR: 2.3

## 2016-03-20 MED ORDER — QUINAPRIL HCL 20 MG PO TABS: 20.0000 mg | ORAL_TABLET | Freq: Two times a day (BID) | ORAL | Status: AC

## 2016-03-20 NOTE — Progress Notes (Signed)
Patient also asked to have Rx sent in for quinipril 23m bid instead of 487m1/2 bid so he did not have to half tablets.  Rx sent in.

## 2016-03-27 LAB — PROTIME-INR: INR: 1.8 — AB (ref 0.9–1.1)

## 2016-03-28 ENCOUNTER — Telehealth: Payer: Self-pay | Admitting: *Deleted

## 2016-03-28 NOTE — Telephone Encounter (Signed)
[facility] result of 1.8 Per Dr Livia Snellen, continue current dose and rck next week Pt notified of recommendation Verbalizes understanding

## 2016-04-03 DIAGNOSIS — Z7901 Long term (current) use of anticoagulants: Secondary | ICD-10-CM | POA: Diagnosis not present

## 2016-04-03 DIAGNOSIS — I482 Chronic atrial fibrillation: Secondary | ICD-10-CM | POA: Diagnosis not present

## 2016-04-03 LAB — POCT INR: INR: 1.7

## 2016-04-04 ENCOUNTER — Telehealth: Payer: Self-pay | Admitting: Pharmacist Clinician (PhC)/ Clinical Pharmacy Specialist

## 2016-04-04 ENCOUNTER — Ambulatory Visit: Payer: Self-pay | Admitting: Pharmacist Clinician (PhC)/ Clinical Pharmacy Specialist

## 2016-04-04 DIAGNOSIS — I482 Chronic atrial fibrillation, unspecified: Secondary | ICD-10-CM

## 2016-04-04 NOTE — Telephone Encounter (Signed)
Called patient and left a message with results and asked him to call back if he has missed any doses or had any medication changes, etc.  I asked him to increase his warfarin to 10m Tues/Frid and 654mall other days of the week (he has 45m50mnd 2 mg tablets at home).  Re-check in 1 week INR.

## 2016-04-10 ENCOUNTER — Encounter: Payer: Self-pay | Admitting: Family Medicine

## 2016-04-11 ENCOUNTER — Telehealth: Payer: Self-pay | Admitting: *Deleted

## 2016-04-11 ENCOUNTER — Ambulatory Visit (INDEPENDENT_AMBULATORY_CARE_PROVIDER_SITE_OTHER): Payer: Medicare Other | Admitting: Pharmacist

## 2016-04-11 ENCOUNTER — Encounter: Payer: Medicare Other | Admitting: Pharmacist

## 2016-04-11 DIAGNOSIS — I482 Chronic atrial fibrillation, unspecified: Secondary | ICD-10-CM

## 2016-04-11 LAB — POCT INR: INR: 1.3

## 2016-04-11 NOTE — Progress Notes (Signed)
Subjective:     Indication: atrial fibrillation Bleeding signs/symptoms: None Thromboembolic signs/symptoms: None  Missed Coumadin doses: None Medication changes: no Dietary changes: no Bacterial/viral infection: no Other concerns: no    Objective:    INR Today: 1.3 Current dose: 57m tuesdays and fridays; 652mall other days   Assessment:    Subtherapeutic INR for goal of 2-3   Plan:    1. New dose: take 1026moday, then increase to 6mg57mWF and 7mg 54m other days.   2. Next INR: 1 week    Patient checks INR at home with Home INR monitoring.  Billing once per month interupertation fee.  Patient diagnosis - venous thromboembolism.  Procedure code if [id number]

## 2016-04-11 NOTE — Telephone Encounter (Signed)
Discussed with patient - see anticoagulation note.  Increase warfarin to 48m today, then 6319mMWF and 19m30mll other days. Recheck in 1 week.

## 2016-04-11 NOTE — Telephone Encounter (Signed)
INR 1.3 Just got call, never received fax

## 2016-04-24 LAB — POCT INR: INR: 1.5

## 2016-04-25 ENCOUNTER — Ambulatory Visit (INDEPENDENT_AMBULATORY_CARE_PROVIDER_SITE_OTHER): Payer: Self-pay | Admitting: Pharmacist

## 2016-04-25 DIAGNOSIS — I482 Chronic atrial fibrillation, unspecified: Secondary | ICD-10-CM

## 2016-04-25 NOTE — Progress Notes (Signed)
No charge for labs or visit - INR check through home monitoring system  

## 2016-04-26 ENCOUNTER — Encounter: Payer: Self-pay | Admitting: Family Medicine

## 2016-04-26 ENCOUNTER — Ambulatory Visit (INDEPENDENT_AMBULATORY_CARE_PROVIDER_SITE_OTHER): Payer: Medicare Other | Admitting: Family Medicine

## 2016-04-26 VITALS — BP 121/79 | HR 64 | Temp 98.0°F | Ht 68.0 in

## 2016-04-26 DIAGNOSIS — G894 Chronic pain syndrome: Secondary | ICD-10-CM | POA: Diagnosis not present

## 2016-04-26 DIAGNOSIS — E1165 Type 2 diabetes mellitus with hyperglycemia: Secondary | ICD-10-CM

## 2016-04-26 DIAGNOSIS — E1140 Type 2 diabetes mellitus with diabetic neuropathy, unspecified: Secondary | ICD-10-CM

## 2016-04-26 DIAGNOSIS — M5106 Intervertebral disc disorders with myelopathy, lumbar region: Secondary | ICD-10-CM

## 2016-04-26 DIAGNOSIS — IMO0002 Reserved for concepts with insufficient information to code with codable children: Secondary | ICD-10-CM

## 2016-04-26 DIAGNOSIS — Z139 Encounter for screening, unspecified: Secondary | ICD-10-CM

## 2016-04-26 LAB — BAYER DCA HB A1C WAIVED: HB A1C: 6 % (ref <7.0)

## 2016-04-26 MED ORDER — OXYCODONE-ACETAMINOPHEN 10-325 MG PO TABS: 1.0000 | ORAL_TABLET | Freq: Four times a day (QID) | ORAL | Status: DC

## 2016-04-26 NOTE — Patient Instructions (Addendum)
Great to see you!  Come back to see Dr. Livia Snellen in 1 month  We will call with you A1C result in a few days.

## 2016-04-26 NOTE — Progress Notes (Signed)
HPI  Patient presents today here for diabetes and chronic pain follow-up.  Patient states that he has virtually no pain taking 10 mg of oxycodone every 6 hours. He has chronic low back pain and knee pain  He's been taking these indications chronically.  He's also on benzodiazepines, these of been prescribed by his PCP as well as his psychiatrist   Type 2 diabetes Diet controlled, not watching his diet very carefully No formal exercise due to limitations with morbid obesity and musculoskeletal pain.  Morbid obesity He is very limited given his back pain and chronic knee pain He is motivated to watch his diet a little bit closer.   PMH: Smoking status noted ROS: Per HPI  Objective: BP 121/79 mmHg  Pulse 64  Temp(Src) 98 F (36.7 C) (Oral)  Ht 5' 8" (1.727 m)  Wt  Gen: NAD, alert, cooperative with exam HEENT: NCAT CV: RRR, good S1/S2, no murmur Resp: CTABL, no wheezes, non-labored Ext: No edema, warm Neuro: Alert and oriented, No gross deficits  Assessment and plan:  # Chronic pain syndrome, chronic back and knee pain. Refilled oxycodone 1 month, he will follow-up with his PCP in one month. Patient states he is very well controlled on current dose Recent toxassure is as expected  # Morbid obesity Limited as to how much exercise he can do given chronic knee and back pain. Discussed diet  # 2 diabetes A1c pending, likely well controlled with A1c's are usually well controlled No medications Discussed diet control   Follow up one month with PCP   Orders Placed This Encounter  Procedures  . Bayer DCA Hb A1c Waived    Meds ordered this encounter  Medications  . oxyCODONE-acetaminophen (PERCOCET) 10-325 MG tablet    Sig: Take 1 tablet by mouth 4 (four) times daily.    Dispense:  120 tablet    Refill:  0    Please do not fill until 05/10/2016    Laroy Apple, MD [facility] 04/26/2016, 10:37 AM

## 2016-05-01 ENCOUNTER — Other Ambulatory Visit: Payer: Self-pay | Admitting: *Deleted

## 2016-05-01 ENCOUNTER — Ambulatory Visit (INDEPENDENT_AMBULATORY_CARE_PROVIDER_SITE_OTHER): Payer: Self-pay | Admitting: Pharmacist

## 2016-05-01 ENCOUNTER — Other Ambulatory Visit: Payer: Medicare Other

## 2016-05-01 DIAGNOSIS — I482 Chronic atrial fibrillation, unspecified: Secondary | ICD-10-CM

## 2016-05-01 DIAGNOSIS — Z7901 Long term (current) use of anticoagulants: Secondary | ICD-10-CM | POA: Diagnosis not present

## 2016-05-01 DIAGNOSIS — Z139 Encounter for screening, unspecified: Secondary | ICD-10-CM | POA: Diagnosis not present

## 2016-05-01 LAB — POCT INR: INR: 1.8

## 2016-05-01 NOTE — Addendum Note (Signed)
Addended by: Liliane Bade on: 05/01/2016 01:41 PM   Modules accepted: Orders

## 2016-05-02 ENCOUNTER — Ambulatory Visit (INDEPENDENT_AMBULATORY_CARE_PROVIDER_SITE_OTHER): Payer: Self-pay | Admitting: Pharmacist

## 2016-05-02 ENCOUNTER — Telehealth: Payer: Self-pay | Admitting: Family Medicine

## 2016-05-02 DIAGNOSIS — Z87891 Personal history of nicotine dependence: Secondary | ICD-10-CM | POA: Diagnosis not present

## 2016-05-02 DIAGNOSIS — I482 Chronic atrial fibrillation, unspecified: Secondary | ICD-10-CM

## 2016-05-02 DIAGNOSIS — E890 Postprocedural hypothyroidism: Secondary | ICD-10-CM | POA: Diagnosis not present

## 2016-05-02 NOTE — Progress Notes (Signed)
Duplicate INR received from [facility] today - same INR reported from yesterday.   Note opened in error.

## 2016-05-02 NOTE — Telephone Encounter (Signed)
Wants new CPAP, i will work on this

## 2016-05-04 DIAGNOSIS — I472 Ventricular tachycardia: Secondary | ICD-10-CM | POA: Diagnosis not present

## 2016-05-04 DIAGNOSIS — I10 Essential (primary) hypertension: Secondary | ICD-10-CM | POA: Diagnosis not present

## 2016-05-04 DIAGNOSIS — I272 Other secondary pulmonary hypertension: Secondary | ICD-10-CM | POA: Diagnosis not present

## 2016-05-04 DIAGNOSIS — I2510 Atherosclerotic heart disease of native coronary artery without angina pectoris: Secondary | ICD-10-CM | POA: Diagnosis not present

## 2016-05-04 DIAGNOSIS — G4733 Obstructive sleep apnea (adult) (pediatric): Secondary | ICD-10-CM | POA: Diagnosis not present

## 2016-05-04 DIAGNOSIS — Z86711 Personal history of pulmonary embolism: Secondary | ICD-10-CM | POA: Diagnosis not present

## 2016-05-04 DIAGNOSIS — I5042 Chronic combined systolic (congestive) and diastolic (congestive) heart failure: Secondary | ICD-10-CM | POA: Diagnosis not present

## 2016-05-04 LAB — FECAL OCCULT BLOOD, IMMUNOCHEMICAL: Fecal Occult Bld: POSITIVE — AB

## 2016-05-05 DIAGNOSIS — D0439 Carcinoma in situ of skin of other parts of face: Secondary | ICD-10-CM | POA: Diagnosis not present

## 2016-05-05 DIAGNOSIS — I5042 Chronic combined systolic (congestive) and diastolic (congestive) heart failure: Secondary | ICD-10-CM | POA: Diagnosis not present

## 2016-05-05 DIAGNOSIS — C44329 Squamous cell carcinoma of skin of other parts of face: Secondary | ICD-10-CM | POA: Diagnosis not present

## 2016-05-05 DIAGNOSIS — L570 Actinic keratosis: Secondary | ICD-10-CM | POA: Diagnosis not present

## 2016-05-05 DIAGNOSIS — D485 Neoplasm of uncertain behavior of skin: Secondary | ICD-10-CM | POA: Diagnosis not present

## 2016-05-08 ENCOUNTER — Ambulatory Visit (INDEPENDENT_AMBULATORY_CARE_PROVIDER_SITE_OTHER): Payer: Medicare Other | Admitting: Pharmacist

## 2016-05-08 ENCOUNTER — Telehealth: Payer: Self-pay | Admitting: Pharmacist

## 2016-05-08 DIAGNOSIS — I482 Chronic atrial fibrillation, unspecified: Secondary | ICD-10-CM

## 2016-05-08 LAB — POCT INR: INR: 1.4

## 2016-05-08 MED ORDER — PANTOPRAZOLE SODIUM 40 MG PO TBEC: 40.0000 mg | DELAYED_RELEASE_TABLET | Freq: Two times a day (BID) | ORAL | Status: AC

## 2016-05-08 MED ORDER — PANTOPRAZOLE SODIUM 40 MG PO TBEC: 40.0000 mg | DELAYED_RELEASE_TABLET | Freq: Two times a day (BID) | ORAL | Status: DC

## 2016-05-08 NOTE — Telephone Encounter (Signed)
REFILL SENT TO PHARMACY

## 2016-05-10 ENCOUNTER — Telehealth: Payer: Self-pay | Admitting: *Deleted

## 2016-05-17 LAB — POCT INR: INR: 1.9

## 2016-05-18 ENCOUNTER — Ambulatory Visit (INDEPENDENT_AMBULATORY_CARE_PROVIDER_SITE_OTHER): Payer: Medicare Other | Admitting: Pharmacist

## 2016-05-18 DIAGNOSIS — I482 Chronic atrial fibrillation, unspecified: Secondary | ICD-10-CM

## 2016-05-22 ENCOUNTER — Ambulatory Visit (INDEPENDENT_AMBULATORY_CARE_PROVIDER_SITE_OTHER): Payer: Self-pay | Admitting: Pharmacist

## 2016-05-22 DIAGNOSIS — I482 Chronic atrial fibrillation, unspecified: Secondary | ICD-10-CM

## 2016-05-22 LAB — POCT INR: INR: 1.9

## 2016-05-23 DIAGNOSIS — C44329 Squamous cell carcinoma of skin of other parts of face: Secondary | ICD-10-CM | POA: Diagnosis not present

## 2016-05-23 DIAGNOSIS — D485 Neoplasm of uncertain behavior of skin: Secondary | ICD-10-CM | POA: Diagnosis not present

## 2016-05-24 DIAGNOSIS — D0439 Carcinoma in situ of skin of other parts of face: Secondary | ICD-10-CM | POA: Diagnosis not present

## 2016-05-29 ENCOUNTER — Ambulatory Visit (INDEPENDENT_AMBULATORY_CARE_PROVIDER_SITE_OTHER): Payer: Self-pay | Admitting: Pharmacist

## 2016-05-29 DIAGNOSIS — Z7901 Long term (current) use of anticoagulants: Secondary | ICD-10-CM | POA: Diagnosis not present

## 2016-05-29 DIAGNOSIS — I482 Chronic atrial fibrillation, unspecified: Secondary | ICD-10-CM

## 2016-05-29 LAB — POCT INR: INR: 1.8

## 2016-05-29 NOTE — Progress Notes (Signed)
No charge for labs or visit - INR check through home monitoring system

## 2016-06-01 ENCOUNTER — Telehealth: Payer: Self-pay | Admitting: Family Medicine

## 2016-06-01 MED ORDER — POTASSIUM CHLORIDE ER 10 MEQ PO TBCR: 40.0000 meq | EXTENDED_RELEASE_TABLET | Freq: Two times a day (BID) | ORAL | 1 refills | Status: DC

## 2016-06-01 NOTE — Telephone Encounter (Signed)
Patient requested refill on potassium supplement - rx sent in to [facility].  Appt to see PCP is 06/06/16

## 2016-06-05 ENCOUNTER — Ambulatory Visit (INDEPENDENT_AMBULATORY_CARE_PROVIDER_SITE_OTHER): Payer: Self-pay | Admitting: Pharmacist

## 2016-06-05 DIAGNOSIS — I482 Chronic atrial fibrillation, unspecified: Secondary | ICD-10-CM

## 2016-06-05 LAB — POCT INR: INR: 1.8

## 2016-06-06 ENCOUNTER — Other Ambulatory Visit: Payer: Self-pay | Admitting: Family Medicine

## 2016-06-06 ENCOUNTER — Ambulatory Visit (INDEPENDENT_AMBULATORY_CARE_PROVIDER_SITE_OTHER): Payer: Medicare Other | Admitting: Family Medicine

## 2016-06-06 ENCOUNTER — Encounter: Payer: Self-pay | Admitting: Family Medicine

## 2016-06-06 ENCOUNTER — Ambulatory Visit (INDEPENDENT_AMBULATORY_CARE_PROVIDER_SITE_OTHER): Payer: Medicare Other

## 2016-06-06 VITALS — BP 126/71 | HR 103 | Temp 97.4°F | Ht 68.0 in | Wt >= 6400 oz

## 2016-06-06 DIAGNOSIS — I482 Chronic atrial fibrillation, unspecified: Secondary | ICD-10-CM

## 2016-06-06 DIAGNOSIS — R05 Cough: Secondary | ICD-10-CM | POA: Diagnosis not present

## 2016-06-06 DIAGNOSIS — E1140 Type 2 diabetes mellitus with diabetic neuropathy, unspecified: Secondary | ICD-10-CM | POA: Diagnosis not present

## 2016-06-06 DIAGNOSIS — E038 Other specified hypothyroidism: Secondary | ICD-10-CM | POA: Diagnosis not present

## 2016-06-06 DIAGNOSIS — E1165 Type 2 diabetes mellitus with hyperglycemia: Secondary | ICD-10-CM | POA: Diagnosis not present

## 2016-06-06 DIAGNOSIS — M5106 Intervertebral disc disorders with myelopathy, lumbar region: Secondary | ICD-10-CM | POA: Diagnosis not present

## 2016-06-06 DIAGNOSIS — R059 Cough, unspecified: Secondary | ICD-10-CM

## 2016-06-06 DIAGNOSIS — E034 Atrophy of thyroid (acquired): Secondary | ICD-10-CM

## 2016-06-06 DIAGNOSIS — IMO0002 Reserved for concepts with insufficient information to code with codable children: Secondary | ICD-10-CM

## 2016-06-06 MED ORDER — OXYCODONE-ACETAMINOPHEN 10-325 MG PO TABS: 1.0000 | ORAL_TABLET | Freq: Four times a day (QID) | ORAL | 0 refills | Status: DC

## 2016-06-06 MED ORDER — AMOXICILLIN-POT CLAVULANATE 875-125 MG PO TABS: 1.0000 | ORAL_TABLET | Freq: Two times a day (BID) | ORAL | 0 refills | Status: DC

## 2016-06-06 MED ORDER — OXYCODONE-ACETAMINOPHEN 10-325 MG PO TABS
1.0000 | ORAL_TABLET | Freq: Four times a day (QID) | ORAL | 0 refills | Status: DC
Start: 2016-06-06 — End: 2016-09-07

## 2016-06-06 MED ORDER — AZITHROMYCIN 250 MG PO TABS: ORAL_TABLET | ORAL | 0 refills | Status: DC

## 2016-06-06 NOTE — Patient Instructions (Addendum)
Discontinue your Coumadin/warfarin 5 days prior to your skin cancer surgery. The day after your surgery resume your Coumadin. For the first 2 days take a double dose. After that resume your regular dose. Check your Coumadin every other day during that next week. You should continue doing this until you get to successive readings between 2.0 and 3.0.  For dry skin try eucerin, cetaphil, or  aquaphor.

## 2016-06-06 NOTE — Progress Notes (Signed)
Subjective:  Patient ID: Gregg Taylor, male    DOB: October 05, 1946  Age: 70 y.o. MRN: [id number]  CC: Atrial Fibrillation (1 mth rck); Diabetes; and Peripheral Neuropathy   HPI Gregg Taylor presents for Follow-up of diabetes. Patient does check blood sugar at home Running 94-107 Patient denies symptoms such as polyuria, polydipsia, excessive hunger, nausea No significant hypoglycemic spells noted. Medications as noted below. Taking them regularly without complication/adverse reaction being reported today.   Pain free with med. At back and knees, but right shoulder hurts a lot. Planning to see orthopedist later this week. Agrees he needs to exercise more. Says he's gained about 50 pounds in the last year possibly more. But he has changed his diet and is beginning to bring it down.  Needs skin Ca removed from right cheek at Cancer center. Planning Mohs procedure. Needs call about pt. Coumadin.     Patient in for follow-up of atrial fibrillation. Patient denies any recent bouts of chest pain or palpitations. Additionally, patient is taking anticoagulants. Patient denies any recent excessive bleeding episodes including epistaxis, bleeding from the gums, genitalia, rectal bleeding or hematuria. Additionally there has been no excessive bruising.   History Gregg Taylor has a past medical history of Arthritis; Cancer [facility]); CHF (congestive heart failure) ([place]); Depression; Diabetes mellitus without complication ([place]); Hyperlipidemia; and Hypertension.   He has a past surgical history that includes Appendectomy; Cholecystectomy; Gastric bypass (1991); and Fracture surgery.   His family history includes Alcohol abuse in his father; Cancer in his mother; Depression in his brother and father; Heart disease in his father; Hypertension in his brother and father; Stroke in his brother.He reports that he quit smoking about 13 years ago. His smoking use included Cigarettes. He started smoking about 38 years ago. He  smoked 0.50 packs per day. He has never used smokeless tobacco. He reports that he does not drink alcohol or use drugs.    ROS Review of Systems  Constitutional: Negative for activity change, appetite change, chills and fever.  HENT: Positive for congestion, postnasal drip, rhinorrhea and sinus pressure. Negative for ear discharge, ear pain, hearing loss, nosebleeds, sneezing and trouble swallowing.   Respiratory: Positive for cough (Onset yesterday. Brought up thick sputum with yellow tint. This AM mostly white.) and chest tightness (& sore - substernal.). Negative for shortness of breath.   Cardiovascular: Negative for chest pain and palpitations.  Skin: Negative for rash.    Objective:  BP 126/71 (BP Location: Left Arm, Patient Position: Sitting, Cuff Size: Large)   Pulse (!) 103   Temp 97.4 F (36.3 C) (Oral)   Ht _0  (1.727 m)   Wt (!) 406 lb (184.2 kg)   SpO2 96%   BMI 61.73 kg/m   BP Readings from Last 3 Encounters:  06/06/16 126/71  04/26/16 121/79  01/26/16 131/74    Wt Readings from Last 3 Encounters:  06/06/16 (!) 406 lb (184.2 kg)  01/26/16 (!) 405 lb (183.7 kg)  10/22/15 (!) 388 lb 6.4 oz (176.2 kg)     Physical Exam  Constitutional: He is oriented to person, place, and time. He appears well-developed. No distress.  Morbid obesity   HENT:  Head: Normocephalic and atraumatic.  Right Ear: External ear normal.  Left Ear: External ear normal.  Nose: Nose normal.  Mouth/Throat: Oropharynx is clear and moist.  Eyes: Conjunctivae and EOM are normal. Pupils are equal, round, and reactive to light.  Neck: Normal range of motion. Neck supple. No thyromegaly present.  Cardiovascular:  Normal rate, regular rhythm and normal heart sounds.   No murmur heard. Pulmonary/Chest: Effort normal. No respiratory distress. He has wheezes (few scattered). He has no rales. He exhibits tenderness (lower sternum for compression).  Abdominal: Soft. Bowel sounds are normal. He  exhibits no distension. There is no tenderness.  Lymphadenopathy:    He has no cervical adenopathy.  Neurological: He is alert and oriented to person, place, and time. He has normal reflexes.  Skin: Skin is warm and dry.  Psychiatric: He has a normal mood and affect. His behavior is normal. Judgment and thought content normal.     Lab Results  Component Value Date   WBC 5.7 01/26/2016   HGB 11.5 (A) 09/22/2015   HCT 37.0 (L) 01/26/2016   PLT 305 01/26/2016   GLUCOSE 103 (H) 01/26/2016   CHOL 139 01/26/2016   TRIG 66 01/26/2016   HDL 82 01/26/2016   LDLCALC 44 01/26/2016   ALT 14 01/26/2016   AST 18 01/26/2016   NA 147 (H) 01/26/2016   K 4.5 01/26/2016   CL 102 01/26/2016   CREATININE 1.04 01/26/2016   BUN 14 01/26/2016   CO2 28 01/26/2016   TSH 3.200 01/26/2016   INR 1.8 06/05/2016   HGBA1C 5.9 10/22/2015    Patient was never admitted.  Assessment & Plan:   Charon was seen today for atrial fibrillation, diabetes and peripheral neuropathy.  Diagnoses and all orders for this visit:  Chronic atrial fibrillation ([place]) -     CBC with Differential/Platelet -     CMP14+EGFR  Type 2 diabetes mellitus, uncontrolled, with neuropathy (HCC) -     TSH + free T4 -     CBC with Differential/Platelet -     CMP14+EGFR -     Lipid panel  Intervertebral lumbar disc disorder with myelopathy, lumbar region -     CBC with Differential/Platelet -     CMP14+EGFR  Morbid obesity, unspecified obesity type (HCC) -     TSH + free T4 -     CBC with Differential/Platelet -     CMP14+EGFR  Cough -     TSH + free T4 -     CBC with Differential/Platelet -     CMP14+EGFR -     DG Chest 2 View; Future  Hypothyroidism due to acquired atrophy of thyroid  Other orders -     oxyCODONE-acetaminophen (PERCOCET) 10-325 MG tablet; Take 1 tablet by mouth 4 (four) times daily. -     oxyCODONE-acetaminophen (PERCOCET) 10-325 MG tablet; Take 1 tablet by mouth 4 (four) times daily. -      oxyCODONE-acetaminophen (PERCOCET) 10-325 MG tablet; Take 1 tablet by mouth 4 (four) times daily.   I have discontinued Mr. Villafuerte's lubiprostone. I am also having him maintain his simvastatin, vitamin C, aspirin, Vitamin D3, multivitamin, OXYGEN, Respiratory Therapy Supplies, vitamin B-12, blood glucose meter kit and supplies, escitalopram, warfarin, warfarin, ALPRAZolam, hydrALAZINE, levocetirizine, levothyroxine, metoprolol succinate, spironolactone, bumetanide, temazepam, mirtazapine, warfarin, gabapentin, quinapril, pantoprazole, potassium chloride, pilocarpine, isosorbide dinitrate, oxyCODONE-acetaminophen, oxyCODONE-acetaminophen, and oxyCODONE-acetaminophen.  Meds ordered this encounter  Medications  . pilocarpine (SALAGEN) 5 MG tablet    Sig: Take 5 mg by mouth 3 (three) times daily.  . isosorbide dinitrate (ISORDIL) 30 MG tablet    Sig: Take 30 mg by mouth daily.  Marland Kitchen oxyCODONE-acetaminophen (PERCOCET) 10-325 MG tablet    Sig: Take 1 tablet by mouth 4 (four) times daily.    Dispense:  120 tablet    Refill:  0    Do not fill until 06/09/2016  . oxyCODONE-acetaminophen (PERCOCET) 10-325 MG tablet    Sig: Take 1 tablet by mouth 4 (four) times daily.    Dispense:  120 tablet    Refill:  0    Do not fill until 07/09/2016  . oxyCODONE-acetaminophen (PERCOCET) 10-325 MG tablet    Sig: Take 1 tablet by mouth 4 (four) times daily.    Dispense:  120 tablet    Refill:  0    Please do not fill until 08/08/2016     Follow-up: Return in about 3 months (around 09/06/2016).  Claretta Fraise, M.D.

## 2016-06-06 NOTE — Progress Notes (Signed)
Possible infiltrate. Pt. Notified. Called in zithro & augmentin

## 2016-06-07 LAB — CBC WITH DIFFERENTIAL/PLATELET
BASOS ABS: 0 10*3/uL (ref 0.0–0.2)
Basos: 0 %
EOS (ABSOLUTE): 0.1 10*3/uL (ref 0.0–0.4)
Eos: 2 %
HEMOGLOBIN: 11.4 g/dL — AB (ref 12.6–17.7)
Hematocrit: 37.4 % — ABNORMAL LOW (ref 37.5–51.0)
Immature Grans (Abs): 0 10*3/uL (ref 0.0–0.1)
Immature Granulocytes: 0 %
LYMPHS ABS: 1.8 10*3/uL (ref 0.7–3.1)
Lymphs: 22 %
MCH: 26.4 pg — AB (ref 26.6–33.0)
MCHC: 30.5 g/dL — AB (ref 31.5–35.7)
MCV: 87 fL (ref 79–97)
MONOCYTES: 8 %
MONOS ABS: 0.6 10*3/uL (ref 0.1–0.9)
NEUTROS ABS: 5.4 10*3/uL (ref 1.4–7.0)
Neutrophils: 68 %
Platelets: 376 10*3/uL (ref 150–379)
RBC: 4.32 x10E6/uL (ref 4.14–5.80)
RDW: 16.5 % — AB (ref 12.3–15.4)
WBC: 8 10*3/uL (ref 3.4–10.8)

## 2016-06-07 LAB — CMP14+EGFR
ALBUMIN: 3.8 g/dL (ref 3.5–4.8)
ALK PHOS: 100 IU/L (ref 39–117)
ALT: 12 IU/L (ref 0–44)
AST: 16 IU/L (ref 0–40)
Albumin/Globulin Ratio: 1.3 (ref 1.2–2.2)
BILIRUBIN TOTAL: 0.2 mg/dL (ref 0.0–1.2)
BUN / CREAT RATIO: 14 (ref 10–24)
BUN: 17 mg/dL (ref 8–27)
CHLORIDE: 103 mmol/L (ref 96–106)
CO2: 32 mmol/L — ABNORMAL HIGH (ref 18–29)
Calcium: 9.7 mg/dL (ref 8.6–10.2)
Creatinine, Ser: 1.2 mg/dL (ref 0.76–1.27)
GFR calc Af Amer: 70 mL/min/{1.73_m2} (ref >59)
GFR calc non Af Amer: 61 mL/min/{1.73_m2} (ref >59)
GLOBULIN, TOTAL: 2.9 g/dL (ref 1.5–4.5)
GLUCOSE: 111 mg/dL — AB (ref 65–99)
Potassium: 5 mmol/L (ref 3.5–5.2)
SODIUM: 152 mmol/L — AB (ref 134–144)
Total Protein: 6.7 g/dL (ref 6.0–8.5)

## 2016-06-07 LAB — LIPID PANEL
CHOLESTEROL TOTAL: 160 mg/dL (ref 100–199)
Chol/HDL Ratio: 2.4 ratio units (ref 0.0–5.0)
HDL: 66 mg/dL (ref >39)
LDL Calculated: 67 mg/dL (ref 0–99)
TRIGLYCERIDES: 135 mg/dL (ref 0–149)
VLDL Cholesterol Cal: 27 mg/dL (ref 5–40)

## 2016-06-07 LAB — TSH+FREE T4
FREE T4: 1.32 ng/dL (ref 0.82–1.77)
TSH: 4.32 u[IU]/mL (ref 0.450–4.500)

## 2016-06-07 NOTE — Telephone Encounter (Signed)
This has been taken care of.

## 2016-06-12 ENCOUNTER — Telehealth: Payer: Self-pay | Admitting: Family Medicine

## 2016-06-12 ENCOUNTER — Ambulatory Visit (INDEPENDENT_AMBULATORY_CARE_PROVIDER_SITE_OTHER): Payer: Medicare Other | Admitting: Pharmacist

## 2016-06-12 ENCOUNTER — Other Ambulatory Visit: Payer: Self-pay | Admitting: Pharmacist

## 2016-06-12 DIAGNOSIS — I482 Chronic atrial fibrillation, unspecified: Secondary | ICD-10-CM

## 2016-06-12 LAB — POCT INR: INR: 1.5

## 2016-06-12 MED ORDER — WARFARIN SODIUM 5 MG PO TABS: 5.0000 mg | ORAL_TABLET | Freq: Every day | ORAL | 1 refills | Status: DC

## 2016-06-12 MED ORDER — WARFARIN SODIUM 4 MG PO TABS: 4.0000 mg | ORAL_TABLET | Freq: Every day | ORAL | 0 refills | Status: DC

## 2016-06-12 NOTE — Telephone Encounter (Signed)
Rx for warfarin 12m sent to pharmacy

## 2016-06-19 ENCOUNTER — Telehealth: Payer: Self-pay | Admitting: Family Medicine

## 2016-06-19 ENCOUNTER — Ambulatory Visit (INDEPENDENT_AMBULATORY_CARE_PROVIDER_SITE_OTHER): Payer: Self-pay | Admitting: Pharmacist

## 2016-06-19 DIAGNOSIS — I482 Chronic atrial fibrillation, unspecified: Secondary | ICD-10-CM

## 2016-06-19 LAB — POCT INR: INR: 3

## 2016-06-19 NOTE — Telephone Encounter (Signed)
Continue with plan to hold warfarin starting today until after surgery 06/26/2016.  Patient will restart warfarin 06/27/2016 and take 10 mg for 2 days, then restart usual dose of 16m qd except 738mTu/Th/Sun.  Recheck INR after surgery on 06/28/2016. Recommendations communicated to patient and he voiced understanding.  INR result and recommendations faxed at patient request to Dr PeDanielle Dess PiLevada Dy

## 2016-06-26 DIAGNOSIS — C44329 Squamous cell carcinoma of skin of other parts of face: Secondary | ICD-10-CM | POA: Diagnosis not present

## 2016-06-27 DIAGNOSIS — I482 Chronic atrial fibrillation: Secondary | ICD-10-CM | POA: Diagnosis not present

## 2016-06-27 DIAGNOSIS — I509 Heart failure, unspecified: Secondary | ICD-10-CM | POA: Diagnosis not present

## 2016-06-27 DIAGNOSIS — Z888 Allergy status to other drugs, medicaments and biological substances status: Secondary | ICD-10-CM | POA: Diagnosis not present

## 2016-06-27 DIAGNOSIS — I5042 Chronic combined systolic (congestive) and diastolic (congestive) heart failure: Secondary | ICD-10-CM | POA: Diagnosis not present

## 2016-06-27 DIAGNOSIS — Z7901 Long term (current) use of anticoagulants: Secondary | ICD-10-CM | POA: Diagnosis not present

## 2016-06-27 DIAGNOSIS — R0609 Other forms of dyspnea: Secondary | ICD-10-CM | POA: Diagnosis not present

## 2016-06-27 DIAGNOSIS — E873 Alkalosis: Secondary | ICD-10-CM | POA: Diagnosis not present

## 2016-06-27 DIAGNOSIS — J189 Pneumonia, unspecified organism: Secondary | ICD-10-CM | POA: Diagnosis not present

## 2016-06-27 DIAGNOSIS — R600 Localized edema: Secondary | ICD-10-CM | POA: Diagnosis not present

## 2016-06-27 DIAGNOSIS — Z87891 Personal history of nicotine dependence: Secondary | ICD-10-CM | POA: Diagnosis not present

## 2016-06-27 DIAGNOSIS — Z9989 Dependence on other enabling machines and devices: Secondary | ICD-10-CM | POA: Diagnosis not present

## 2016-06-27 DIAGNOSIS — R918 Other nonspecific abnormal finding of lung field: Secondary | ICD-10-CM | POA: Diagnosis not present

## 2016-06-27 DIAGNOSIS — E785 Hyperlipidemia, unspecified: Secondary | ICD-10-CM | POA: Diagnosis present

## 2016-06-27 DIAGNOSIS — Z86711 Personal history of pulmonary embolism: Secondary | ICD-10-CM | POA: Diagnosis not present

## 2016-06-27 DIAGNOSIS — Z7982 Long term (current) use of aspirin: Secondary | ICD-10-CM | POA: Diagnosis not present

## 2016-06-27 DIAGNOSIS — E8770 Fluid overload, unspecified: Secondary | ICD-10-CM | POA: Diagnosis not present

## 2016-06-27 DIAGNOSIS — J9601 Acute respiratory failure with hypoxia: Secondary | ICD-10-CM | POA: Diagnosis not present

## 2016-06-27 DIAGNOSIS — G4733 Obstructive sleep apnea (adult) (pediatric): Secondary | ICD-10-CM | POA: Diagnosis not present

## 2016-06-27 DIAGNOSIS — Z79899 Other long term (current) drug therapy: Secondary | ICD-10-CM | POA: Diagnosis not present

## 2016-06-27 DIAGNOSIS — G8929 Other chronic pain: Secondary | ICD-10-CM | POA: Diagnosis present

## 2016-06-27 DIAGNOSIS — K219 Gastro-esophageal reflux disease without esophagitis: Secondary | ICD-10-CM | POA: Diagnosis present

## 2016-06-27 DIAGNOSIS — Z85828 Personal history of other malignant neoplasm of skin: Secondary | ICD-10-CM | POA: Diagnosis not present

## 2016-06-27 DIAGNOSIS — R0602 Shortness of breath: Secondary | ICD-10-CM | POA: Diagnosis not present

## 2016-06-27 DIAGNOSIS — Z6841 Body Mass Index (BMI) 40.0 and over, adult: Secondary | ICD-10-CM | POA: Diagnosis not present

## 2016-06-27 DIAGNOSIS — Z9981 Dependence on supplemental oxygen: Secondary | ICD-10-CM | POA: Diagnosis not present

## 2016-06-27 DIAGNOSIS — F419 Anxiety disorder, unspecified: Secondary | ICD-10-CM | POA: Diagnosis present

## 2016-06-27 DIAGNOSIS — I272 Other secondary pulmonary hypertension: Secondary | ICD-10-CM | POA: Diagnosis not present

## 2016-06-27 DIAGNOSIS — E875 Hyperkalemia: Secondary | ICD-10-CM | POA: Diagnosis not present

## 2016-06-27 DIAGNOSIS — J441 Chronic obstructive pulmonary disease with (acute) exacerbation: Secondary | ICD-10-CM | POA: Diagnosis present

## 2016-06-27 DIAGNOSIS — Z23 Encounter for immunization: Secondary | ICD-10-CM | POA: Diagnosis not present

## 2016-07-05 DIAGNOSIS — G4733 Obstructive sleep apnea (adult) (pediatric): Secondary | ICD-10-CM | POA: Diagnosis not present

## 2016-07-05 DIAGNOSIS — I5042 Chronic combined systolic (congestive) and diastolic (congestive) heart failure: Secondary | ICD-10-CM | POA: Diagnosis not present

## 2016-07-05 DIAGNOSIS — Z483 Aftercare following surgery for neoplasm: Secondary | ICD-10-CM | POA: Diagnosis not present

## 2016-07-05 DIAGNOSIS — Z86711 Personal history of pulmonary embolism: Secondary | ICD-10-CM | POA: Diagnosis not present

## 2016-07-05 DIAGNOSIS — Z6841 Body Mass Index (BMI) 40.0 and over, adult: Secondary | ICD-10-CM | POA: Diagnosis not present

## 2016-07-05 DIAGNOSIS — Z7982 Long term (current) use of aspirin: Secondary | ICD-10-CM | POA: Diagnosis not present

## 2016-07-05 DIAGNOSIS — J440 Chronic obstructive pulmonary disease with acute lower respiratory infection: Secondary | ICD-10-CM | POA: Diagnosis not present

## 2016-07-05 DIAGNOSIS — I482 Chronic atrial fibrillation: Secondary | ICD-10-CM | POA: Diagnosis not present

## 2016-07-05 DIAGNOSIS — G8929 Other chronic pain: Secondary | ICD-10-CM | POA: Diagnosis not present

## 2016-07-05 DIAGNOSIS — I110 Hypertensive heart disease with heart failure: Secondary | ICD-10-CM | POA: Diagnosis not present

## 2016-07-05 DIAGNOSIS — Z9981 Dependence on supplemental oxygen: Secondary | ICD-10-CM | POA: Diagnosis not present

## 2016-07-05 DIAGNOSIS — Z7901 Long term (current) use of anticoagulants: Secondary | ICD-10-CM | POA: Diagnosis not present

## 2016-07-05 DIAGNOSIS — I272 Other secondary pulmonary hypertension: Secondary | ICD-10-CM | POA: Diagnosis not present

## 2016-07-05 DIAGNOSIS — J441 Chronic obstructive pulmonary disease with (acute) exacerbation: Secondary | ICD-10-CM | POA: Diagnosis not present

## 2016-07-05 DIAGNOSIS — J189 Pneumonia, unspecified organism: Secondary | ICD-10-CM | POA: Diagnosis not present

## 2016-07-05 DIAGNOSIS — Z87891 Personal history of nicotine dependence: Secondary | ICD-10-CM | POA: Diagnosis not present

## 2016-07-05 LAB — POCT INR: INR: 2.1

## 2016-07-06 ENCOUNTER — Ambulatory Visit: Payer: Self-pay | Admitting: Pharmacist

## 2016-07-06 DIAGNOSIS — I482 Chronic atrial fibrillation, unspecified: Secondary | ICD-10-CM

## 2016-07-07 ENCOUNTER — Telehealth: Payer: Self-pay | Admitting: Family Medicine

## 2016-07-07 DIAGNOSIS — J189 Pneumonia, unspecified organism: Secondary | ICD-10-CM | POA: Diagnosis not present

## 2016-07-07 DIAGNOSIS — Z483 Aftercare following surgery for neoplasm: Secondary | ICD-10-CM | POA: Diagnosis not present

## 2016-07-07 DIAGNOSIS — I110 Hypertensive heart disease with heart failure: Secondary | ICD-10-CM | POA: Diagnosis not present

## 2016-07-07 DIAGNOSIS — J440 Chronic obstructive pulmonary disease with acute lower respiratory infection: Secondary | ICD-10-CM | POA: Diagnosis not present

## 2016-07-07 DIAGNOSIS — J441 Chronic obstructive pulmonary disease with (acute) exacerbation: Secondary | ICD-10-CM | POA: Diagnosis not present

## 2016-07-07 DIAGNOSIS — I5042 Chronic combined systolic (congestive) and diastolic (congestive) heart failure: Secondary | ICD-10-CM | POA: Diagnosis not present

## 2016-07-07 NOTE — Telephone Encounter (Signed)
Pharm called and can fill on sat.

## 2016-07-10 LAB — POCT INR: INR: 4.6

## 2016-07-11 ENCOUNTER — Ambulatory Visit (INDEPENDENT_AMBULATORY_CARE_PROVIDER_SITE_OTHER): Payer: Medicare Other | Admitting: Pharmacist

## 2016-07-11 ENCOUNTER — Encounter: Payer: Medicare Other | Admitting: Pharmacist

## 2016-07-11 DIAGNOSIS — J189 Pneumonia, unspecified organism: Secondary | ICD-10-CM | POA: Diagnosis not present

## 2016-07-11 DIAGNOSIS — Z7901 Long term (current) use of anticoagulants: Secondary | ICD-10-CM | POA: Diagnosis not present

## 2016-07-11 DIAGNOSIS — J441 Chronic obstructive pulmonary disease with (acute) exacerbation: Secondary | ICD-10-CM | POA: Diagnosis not present

## 2016-07-11 DIAGNOSIS — Z483 Aftercare following surgery for neoplasm: Secondary | ICD-10-CM | POA: Diagnosis not present

## 2016-07-11 DIAGNOSIS — I482 Chronic atrial fibrillation, unspecified: Secondary | ICD-10-CM

## 2016-07-11 DIAGNOSIS — J440 Chronic obstructive pulmonary disease with acute lower respiratory infection: Secondary | ICD-10-CM | POA: Diagnosis not present

## 2016-07-11 DIAGNOSIS — I5042 Chronic combined systolic (congestive) and diastolic (congestive) heart failure: Secondary | ICD-10-CM | POA: Diagnosis not present

## 2016-07-11 DIAGNOSIS — I110 Hypertensive heart disease with heart failure: Secondary | ICD-10-CM | POA: Diagnosis not present

## 2016-07-11 LAB — POCT INR: INR: 4.1

## 2016-07-11 NOTE — Consult Note (Signed)
  Patient checks INR at home with Home INR monitoring.  Billing once per month interupertation fee.  Patient diagnosis - chronic atrial fibrillation and long term anticoagulation use Procedure code if [id number]

## 2016-07-12 DIAGNOSIS — J189 Pneumonia, unspecified organism: Secondary | ICD-10-CM | POA: Diagnosis not present

## 2016-07-12 DIAGNOSIS — J441 Chronic obstructive pulmonary disease with (acute) exacerbation: Secondary | ICD-10-CM | POA: Diagnosis not present

## 2016-07-12 DIAGNOSIS — I110 Hypertensive heart disease with heart failure: Secondary | ICD-10-CM | POA: Diagnosis not present

## 2016-07-12 DIAGNOSIS — Z483 Aftercare following surgery for neoplasm: Secondary | ICD-10-CM | POA: Diagnosis not present

## 2016-07-12 DIAGNOSIS — I5042 Chronic combined systolic (congestive) and diastolic (congestive) heart failure: Secondary | ICD-10-CM | POA: Diagnosis not present

## 2016-07-12 DIAGNOSIS — J440 Chronic obstructive pulmonary disease with acute lower respiratory infection: Secondary | ICD-10-CM | POA: Diagnosis not present

## 2016-07-13 ENCOUNTER — Ambulatory Visit (INDEPENDENT_AMBULATORY_CARE_PROVIDER_SITE_OTHER): Payer: Medicare Other

## 2016-07-13 ENCOUNTER — Ambulatory Visit (INDEPENDENT_AMBULATORY_CARE_PROVIDER_SITE_OTHER): Payer: Medicare Other | Admitting: Family Medicine

## 2016-07-13 ENCOUNTER — Encounter: Payer: Self-pay | Admitting: Family Medicine

## 2016-07-13 VITALS — BP 137/79 | HR 85 | Temp 98.0°F | Ht 68.0 in | Wt >= 6400 oz

## 2016-07-13 DIAGNOSIS — M19011 Primary osteoarthritis, right shoulder: Secondary | ICD-10-CM | POA: Insufficient documentation

## 2016-07-13 DIAGNOSIS — J690 Pneumonitis due to inhalation of food and vomit: Secondary | ICD-10-CM

## 2016-07-13 DIAGNOSIS — Z7901 Long term (current) use of anticoagulants: Secondary | ICD-10-CM

## 2016-07-13 DIAGNOSIS — E038 Other specified hypothyroidism: Secondary | ICD-10-CM | POA: Diagnosis not present

## 2016-07-13 DIAGNOSIS — M25511 Pain in right shoulder: Secondary | ICD-10-CM

## 2016-07-13 DIAGNOSIS — I482 Chronic atrial fibrillation, unspecified: Secondary | ICD-10-CM

## 2016-07-13 DIAGNOSIS — M5106 Intervertebral disc disorders with myelopathy, lumbar region: Secondary | ICD-10-CM | POA: Diagnosis not present

## 2016-07-13 DIAGNOSIS — E034 Atrophy of thyroid (acquired): Secondary | ICD-10-CM | POA: Diagnosis not present

## 2016-07-13 LAB — CMP14+EGFR
ALT: 15 IU/L (ref 0–44)
AST: 18 IU/L (ref 0–40)
Albumin/Globulin Ratio: 1.3 (ref 1.2–2.2)
Albumin: 3.7 g/dL (ref 3.5–4.8)
Alkaline Phosphatase: 82 IU/L (ref 39–117)
BUN/Creatinine Ratio: 14 (ref 10–24)
BUN: 15 mg/dL (ref 8–27)
Bilirubin Total: 0.3 mg/dL (ref 0.0–1.2)
CALCIUM: 8.9 mg/dL (ref 8.6–10.2)
CO2: 32 mmol/L — ABNORMAL HIGH (ref 18–29)
Chloride: 100 mmol/L (ref 96–106)
Creatinine, Ser: 1.1 mg/dL (ref 0.76–1.27)
GFR, EST AFRICAN AMERICAN: 78 mL/min/{1.73_m2} (ref >59)
GFR, EST NON AFRICAN AMERICAN: 68 mL/min/{1.73_m2} (ref >59)
GLUCOSE: 98 mg/dL (ref 65–99)
Globulin, Total: 2.8 g/dL (ref 1.5–4.5)
Potassium: 4.1 mmol/L (ref 3.5–5.2)
Sodium: 148 mmol/L — ABNORMAL HIGH (ref 134–144)
TOTAL PROTEIN: 6.5 g/dL (ref 6.0–8.5)

## 2016-07-13 LAB — COAGUCHEK XS/INR WAIVED
INR: 2.3 — AB (ref 0.9–1.1)
Prothrombin Time: 27 s

## 2016-07-13 LAB — CBC WITH DIFFERENTIAL/PLATELET
BASOS ABS: 0 10*3/uL (ref 0.0–0.2)
BASOS: 0 %
EOS (ABSOLUTE): 0.1 10*3/uL (ref 0.0–0.4)
Eos: 1 %
Hematocrit: 33.6 % — ABNORMAL LOW (ref 37.5–51.0)
Hemoglobin: 10.8 g/dL — ABNORMAL LOW (ref 12.6–17.7)
IMMATURE GRANS (ABS): 0 10*3/uL (ref 0.0–0.1)
IMMATURE GRANULOCYTES: 1 %
LYMPHS: 15 %
Lymphocytes Absolute: 1.3 10*3/uL (ref 0.7–3.1)
MCH: 25.8 pg — ABNORMAL LOW (ref 26.6–33.0)
MCHC: 32.1 g/dL (ref 31.5–35.7)
MCV: 80 fL (ref 79–97)
Monocytes Absolute: 0.7 10*3/uL (ref 0.1–0.9)
Monocytes: 8 %
NEUTROS PCT: 75 %
Neutrophils Absolute: 6.4 10*3/uL (ref 1.4–7.0)
PLATELETS: 368 10*3/uL (ref 150–379)
RBC: 4.18 x10E6/uL (ref 4.14–5.80)
RDW: 17 % — ABNORMAL HIGH (ref 12.3–15.4)
WBC: 8.4 10*3/uL (ref 3.4–10.8)

## 2016-07-13 LAB — POCT INR: INR: 2.3

## 2016-07-13 NOTE — Progress Notes (Signed)
Subjective:  Patient ID: Gregg Taylor, male    DOB: Aug 09, 1946  Age: 70 y.o. MRN: [id number]  CC: Hospitalization Follow-up (pt was admitted [facility] for pneumonia)   HPI Gregg Taylor presents for Taylor follow-up of pneumonia. He is also in a lot of pain at his right shoulder.The pain is described as 10/10. It inhibits motion. He is trying to drive his scooter and the pain makes it difficult to maneuver. He would like to have a shoulder injection. However, he had an INR of 4.013-4 days ago. He has not had Coumadin for several days. Possibly as long as 2 weeks. He says he didn't have Coumadin while in the Taylor for pneumonia. He still feels bad he cannot get around well. He cannot wash himself adequately. He needs his home health aide to come 3 times a week to help him in the home particularly with bathing but also other ADLs, etc. as well. Patient denies any excessive bleeding. He still has some cough but is at baseline for his shortness of breath.  History Gregg Taylor has a past medical history of Arthritis; Cancer Gregg Taylor); CHF (congestive heart failure) (Gregg Taylor); Depression; Diabetes mellitus without complication (Gregg Taylor); Hyperlipidemia; and Hypertension.   He has a past surgical history that includes Appendectomy; Cholecystectomy; Gastric bypass (1991); and Fracture surgery.   His family history includes Alcohol abuse in his father; Cancer in his mother; Depression in his brother and father; Heart disease in his father; Hypertension in his brother and father; Stroke in his brother.He reports that he quit smoking about 13 years ago. His smoking use included Cigarettes. He started smoking about 38 years ago. He smoked 0.50 packs per day. He has never used smokeless tobacco. He reports that he does not drink alcohol or use drugs.    ROS Review of Systems  Constitutional: Negative for chills, diaphoresis, fever and unexpected weight change.  HENT: Negative for congestion, hearing loss,  rhinorrhea and sore throat.   Eyes: Negative for visual disturbance.  Respiratory: Positive for cough and shortness of breath.   Cardiovascular: Negative for chest pain.  Gastrointestinal: Negative for abdominal pain, constipation and diarrhea.  Genitourinary: Negative for dysuria and flank pain.  Musculoskeletal: Positive for arthralgias. Negative for joint swelling.  Skin: Negative for rash.  Neurological: Negative for dizziness and headaches.  Psychiatric/Behavioral: Negative for dysphoric mood and sleep disturbance.    Objective:  BP 137/79   Pulse 85   Temp 98 F (36.7 C) (Oral)   Ht _0  (1.727 m)   Wt (!) 428 lb (194.1 kg)   SpO2 92%   BMI 65.08 kg/m   BP Readings from Last 3 Encounters:  07/13/16 137/79  06/06/16 126/71  04/26/16 121/79    Wt Readings from Last 3 Encounters:  07/13/16 (!) 428 lb (194.1 kg)  06/06/16 (!) 406 lb (184.2 kg)  01/26/16 (!) 405 lb (183.7 kg)     Physical Exam  Constitutional: He is oriented to person, place, and time. He appears well-developed and well-nourished. No distress.  HENT:  Head: Normocephalic and atraumatic.  Right Ear: External ear normal.  Left Ear: External ear normal.  Nose: Nose normal.  Mouth/Throat: Oropharynx is clear and moist.  Eyes: Conjunctivae and EOM are normal. Pupils are equal, round, and reactive to light.  Neck: Normal range of motion. Neck supple. No thyromegaly present.  Cardiovascular: Normal rate, regular rhythm and normal heart sounds.   No murmur heard. Pulmonary/Chest: Effort normal and breath sounds normal. No respiratory distress. He has  no wheezes. He has no rales.  Abdominal: Soft. Bowel sounds are normal. He exhibits no distension. There is no tenderness.  Musculoskeletal: He exhibits tenderness (decreased abduction.Over the right shoulder). He exhibits no edema.  Lymphadenopathy:    He has no cervical adenopathy.  Neurological: He is alert and oriented to person, place, and time. He has  normal reflexes.  Skin: Skin is warm and dry.  Psychiatric: He has a normal mood and affect. His behavior is normal. Judgment and thought content normal.     Lab Results  Component Value Date   WBC 8.0 06/06/2016   HGB 11.5 (A) 09/22/2015   HCT 37.4 (L) 06/06/2016   PLT 376 06/06/2016   GLUCOSE 111 (H) 06/06/2016   CHOL 160 06/06/2016   TRIG 135 06/06/2016   HDL 66 06/06/2016   LDLCALC 67 06/06/2016   ALT 12 06/06/2016   AST 16 06/06/2016   NA 152 (H) 06/06/2016   K 5.0 06/06/2016   CL 103 06/06/2016   CREATININE 1.20 06/06/2016   BUN 17 06/06/2016   CO2 32 (H) 06/06/2016   TSH 4.320 06/06/2016   INR 2.3 07/13/2016   HGBA1C 5.9 10/22/2015    Patient was never admitted.  Assessment & Plan:   Gregg Taylor was seen today for hospitalization follow-up.  Diagnoses and all orders for this visit:  Long term current use of anticoagulant therapy -     Protime-INR -     CoaguChek XS/INR Waived  Aspiration pneumonia of both lungs, unspecified aspiration pneumonia type, unspecified part of lung ([place]) -     DG Chest 2 View; Future -     CBC with Differential/Platelet -     CMP14+EGFR  Chronic atrial fibrillation (HCC)  Morbid obesity, unspecified obesity type ([place])  Hypothyroidism due to acquired atrophy of thyroid  Intervertebral lumbar disc disorder with myelopathy, lumbar region  Arthritis of shoulder region, right  Other orders -     POCT INR    INR has decreased to 2.1 patient cautioned about risk of hemarthrosis and bruising from steroid injection as well as other risks of the procedure. Patient agrees to go forward with the shoulder injection.   A steroid injection was performed at right shoulder anterior approach using 3 mL Marcaine and and 6 mg of Celestone. This was well tolerated.   I am having Gregg Taylor maintain his simvastatin, vitamin C, aspirin, Vitamin D3, multivitamin, OXYGEN, Respiratory Therapy Supplies, vitamin B-12, blood glucose meter kit and  supplies, escitalopram, ALPRAZolam, hydrALAZINE, levocetirizine, levothyroxine, metoprolol succinate, spironolactone, bumetanide, temazepam, mirtazapine, gabapentin, quinapril, pantoprazole, potassium chloride, pilocarpine, isosorbide dinitrate, oxyCODONE-acetaminophen, oxyCODONE-acetaminophen, oxyCODONE-acetaminophen, warfarin, and warfarin.  No orders of the defined types were placed in this encounter.    Follow-up: Return in about 2 weeks (around 07/27/2016).  Claretta Fraise, M.D.

## 2016-07-14 ENCOUNTER — Other Ambulatory Visit: Payer: Self-pay | Admitting: Family Medicine

## 2016-07-14 MED ORDER — LEVOFLOXACIN 500 MG PO TABS: 500.0000 mg | ORAL_TABLET | Freq: Every day | ORAL | 0 refills | Status: DC

## 2016-07-17 DIAGNOSIS — J441 Chronic obstructive pulmonary disease with (acute) exacerbation: Secondary | ICD-10-CM | POA: Diagnosis not present

## 2016-07-17 DIAGNOSIS — J189 Pneumonia, unspecified organism: Secondary | ICD-10-CM | POA: Diagnosis not present

## 2016-07-17 DIAGNOSIS — I110 Hypertensive heart disease with heart failure: Secondary | ICD-10-CM | POA: Diagnosis not present

## 2016-07-17 DIAGNOSIS — J440 Chronic obstructive pulmonary disease with acute lower respiratory infection: Secondary | ICD-10-CM | POA: Diagnosis not present

## 2016-07-17 DIAGNOSIS — Z483 Aftercare following surgery for neoplasm: Secondary | ICD-10-CM | POA: Diagnosis not present

## 2016-07-17 DIAGNOSIS — I5042 Chronic combined systolic (congestive) and diastolic (congestive) heart failure: Secondary | ICD-10-CM | POA: Diagnosis not present

## 2016-07-17 LAB — POCT INR: INR: 1.8

## 2016-07-18 DIAGNOSIS — J440 Chronic obstructive pulmonary disease with acute lower respiratory infection: Secondary | ICD-10-CM | POA: Diagnosis not present

## 2016-07-18 DIAGNOSIS — J441 Chronic obstructive pulmonary disease with (acute) exacerbation: Secondary | ICD-10-CM | POA: Diagnosis not present

## 2016-07-18 DIAGNOSIS — I110 Hypertensive heart disease with heart failure: Secondary | ICD-10-CM | POA: Diagnosis not present

## 2016-07-18 DIAGNOSIS — Z483 Aftercare following surgery for neoplasm: Secondary | ICD-10-CM | POA: Diagnosis not present

## 2016-07-18 DIAGNOSIS — I5042 Chronic combined systolic (congestive) and diastolic (congestive) heart failure: Secondary | ICD-10-CM | POA: Diagnosis not present

## 2016-07-18 DIAGNOSIS — J189 Pneumonia, unspecified organism: Secondary | ICD-10-CM | POA: Diagnosis not present

## 2016-07-19 ENCOUNTER — Ambulatory Visit (INDEPENDENT_AMBULATORY_CARE_PROVIDER_SITE_OTHER): Payer: Self-pay | Admitting: Pharmacist

## 2016-07-19 DIAGNOSIS — I482 Chronic atrial fibrillation, unspecified: Secondary | ICD-10-CM

## 2016-07-20 ENCOUNTER — Ambulatory Visit (INDEPENDENT_AMBULATORY_CARE_PROVIDER_SITE_OTHER): Payer: Self-pay | Admitting: Pharmacist

## 2016-07-20 DIAGNOSIS — J441 Chronic obstructive pulmonary disease with (acute) exacerbation: Secondary | ICD-10-CM | POA: Diagnosis not present

## 2016-07-20 DIAGNOSIS — I482 Chronic atrial fibrillation, unspecified: Secondary | ICD-10-CM

## 2016-07-20 DIAGNOSIS — J440 Chronic obstructive pulmonary disease with acute lower respiratory infection: Secondary | ICD-10-CM | POA: Diagnosis not present

## 2016-07-20 DIAGNOSIS — J189 Pneumonia, unspecified organism: Secondary | ICD-10-CM | POA: Diagnosis not present

## 2016-07-20 DIAGNOSIS — I110 Hypertensive heart disease with heart failure: Secondary | ICD-10-CM | POA: Diagnosis not present

## 2016-07-20 DIAGNOSIS — I5042 Chronic combined systolic (congestive) and diastolic (congestive) heart failure: Secondary | ICD-10-CM | POA: Diagnosis not present

## 2016-07-20 DIAGNOSIS — Z483 Aftercare following surgery for neoplasm: Secondary | ICD-10-CM | POA: Diagnosis not present

## 2016-07-20 LAB — POCT INR: INR: 1.9

## 2016-07-24 ENCOUNTER — Ambulatory Visit (INDEPENDENT_AMBULATORY_CARE_PROVIDER_SITE_OTHER): Payer: Self-pay | Admitting: Pharmacist

## 2016-07-24 ENCOUNTER — Telehealth: Payer: Self-pay | Admitting: Pharmacist

## 2016-07-24 DIAGNOSIS — J441 Chronic obstructive pulmonary disease with (acute) exacerbation: Secondary | ICD-10-CM | POA: Diagnosis not present

## 2016-07-24 DIAGNOSIS — I110 Hypertensive heart disease with heart failure: Secondary | ICD-10-CM | POA: Diagnosis not present

## 2016-07-24 DIAGNOSIS — E1151 Type 2 diabetes mellitus with diabetic peripheral angiopathy without gangrene: Secondary | ICD-10-CM | POA: Diagnosis not present

## 2016-07-24 DIAGNOSIS — I482 Chronic atrial fibrillation, unspecified: Secondary | ICD-10-CM

## 2016-07-24 DIAGNOSIS — Z483 Aftercare following surgery for neoplasm: Secondary | ICD-10-CM | POA: Diagnosis not present

## 2016-07-24 DIAGNOSIS — I5042 Chronic combined systolic (congestive) and diastolic (congestive) heart failure: Secondary | ICD-10-CM | POA: Diagnosis not present

## 2016-07-24 DIAGNOSIS — J440 Chronic obstructive pulmonary disease with acute lower respiratory infection: Secondary | ICD-10-CM | POA: Diagnosis not present

## 2016-07-24 DIAGNOSIS — L600 Ingrowing nail: Secondary | ICD-10-CM | POA: Diagnosis not present

## 2016-07-24 DIAGNOSIS — J189 Pneumonia, unspecified organism: Secondary | ICD-10-CM | POA: Diagnosis not present

## 2016-07-24 LAB — POCT INR: INR: 2

## 2016-07-24 NOTE — Telephone Encounter (Signed)
Patient aware.  Patient states that he is not hurting bad enough to go to the ED, he just can not get in the car.  Informed patient to go to the ED if pain gets any worse.

## 2016-07-24 NOTE — Telephone Encounter (Signed)
If he feels that badly, I recommend going to E.D. By ambulance. R/O blood clots

## 2016-07-25 DIAGNOSIS — Z483 Aftercare following surgery for neoplasm: Secondary | ICD-10-CM | POA: Diagnosis not present

## 2016-07-25 DIAGNOSIS — J189 Pneumonia, unspecified organism: Secondary | ICD-10-CM | POA: Diagnosis not present

## 2016-07-25 DIAGNOSIS — I110 Hypertensive heart disease with heart failure: Secondary | ICD-10-CM | POA: Diagnosis not present

## 2016-07-25 DIAGNOSIS — J441 Chronic obstructive pulmonary disease with (acute) exacerbation: Secondary | ICD-10-CM | POA: Diagnosis not present

## 2016-07-25 DIAGNOSIS — I5042 Chronic combined systolic (congestive) and diastolic (congestive) heart failure: Secondary | ICD-10-CM | POA: Diagnosis not present

## 2016-07-25 DIAGNOSIS — J440 Chronic obstructive pulmonary disease with acute lower respiratory infection: Secondary | ICD-10-CM | POA: Diagnosis not present

## 2016-07-26 ENCOUNTER — Ambulatory Visit: Payer: Medicare Other | Admitting: Family Medicine

## 2016-07-27 DIAGNOSIS — J440 Chronic obstructive pulmonary disease with acute lower respiratory infection: Secondary | ICD-10-CM | POA: Diagnosis not present

## 2016-07-27 DIAGNOSIS — Z483 Aftercare following surgery for neoplasm: Secondary | ICD-10-CM | POA: Diagnosis not present

## 2016-07-27 DIAGNOSIS — I5042 Chronic combined systolic (congestive) and diastolic (congestive) heart failure: Secondary | ICD-10-CM | POA: Diagnosis not present

## 2016-07-27 DIAGNOSIS — J441 Chronic obstructive pulmonary disease with (acute) exacerbation: Secondary | ICD-10-CM | POA: Diagnosis not present

## 2016-07-27 DIAGNOSIS — J189 Pneumonia, unspecified organism: Secondary | ICD-10-CM | POA: Diagnosis not present

## 2016-07-27 DIAGNOSIS — I110 Hypertensive heart disease with heart failure: Secondary | ICD-10-CM | POA: Diagnosis not present

## 2016-07-28 DIAGNOSIS — I5042 Chronic combined systolic (congestive) and diastolic (congestive) heart failure: Secondary | ICD-10-CM | POA: Diagnosis not present

## 2016-07-28 DIAGNOSIS — Z6841 Body Mass Index (BMI) 40.0 and over, adult: Secondary | ICD-10-CM | POA: Diagnosis not present

## 2016-07-28 DIAGNOSIS — Z7982 Long term (current) use of aspirin: Secondary | ICD-10-CM | POA: Diagnosis not present

## 2016-07-28 DIAGNOSIS — R0602 Shortness of breath: Secondary | ICD-10-CM | POA: Diagnosis not present

## 2016-07-28 DIAGNOSIS — J449 Chronic obstructive pulmonary disease, unspecified: Secondary | ICD-10-CM | POA: Diagnosis not present

## 2016-07-28 DIAGNOSIS — R0600 Dyspnea, unspecified: Secondary | ICD-10-CM | POA: Diagnosis not present

## 2016-07-28 DIAGNOSIS — F419 Anxiety disorder, unspecified: Secondary | ICD-10-CM | POA: Diagnosis not present

## 2016-07-28 DIAGNOSIS — I110 Hypertensive heart disease with heart failure: Secondary | ICD-10-CM | POA: Diagnosis not present

## 2016-07-28 DIAGNOSIS — Z87891 Personal history of nicotine dependence: Secondary | ICD-10-CM | POA: Diagnosis not present

## 2016-07-28 DIAGNOSIS — Z79899 Other long term (current) drug therapy: Secondary | ICD-10-CM | POA: Diagnosis not present

## 2016-07-28 DIAGNOSIS — E785 Hyperlipidemia, unspecified: Secondary | ICD-10-CM | POA: Diagnosis not present

## 2016-07-28 DIAGNOSIS — I509 Heart failure, unspecified: Secondary | ICD-10-CM | POA: Diagnosis not present

## 2016-07-28 DIAGNOSIS — I429 Cardiomyopathy, unspecified: Secondary | ICD-10-CM | POA: Diagnosis not present

## 2016-07-28 DIAGNOSIS — Z888 Allergy status to other drugs, medicaments and biological substances status: Secondary | ICD-10-CM | POA: Diagnosis not present

## 2016-07-28 DIAGNOSIS — Z483 Aftercare following surgery for neoplasm: Secondary | ICD-10-CM | POA: Diagnosis not present

## 2016-07-28 DIAGNOSIS — Z8589 Personal history of malignant neoplasm of other organs and systems: Secondary | ICD-10-CM | POA: Diagnosis not present

## 2016-07-28 DIAGNOSIS — Z9981 Dependence on supplemental oxygen: Secondary | ICD-10-CM | POA: Diagnosis not present

## 2016-07-28 DIAGNOSIS — Z7901 Long term (current) use of anticoagulants: Secondary | ICD-10-CM | POA: Diagnosis not present

## 2016-07-28 DIAGNOSIS — J9611 Chronic respiratory failure with hypoxia: Secondary | ICD-10-CM | POA: Diagnosis not present

## 2016-07-28 DIAGNOSIS — R069 Unspecified abnormalities of breathing: Secondary | ICD-10-CM | POA: Diagnosis not present

## 2016-07-28 DIAGNOSIS — G4733 Obstructive sleep apnea (adult) (pediatric): Secondary | ICD-10-CM | POA: Diagnosis not present

## 2016-07-28 DIAGNOSIS — J984 Other disorders of lung: Secondary | ICD-10-CM | POA: Diagnosis not present

## 2016-07-28 DIAGNOSIS — J440 Chronic obstructive pulmonary disease with acute lower respiratory infection: Secondary | ICD-10-CM | POA: Diagnosis not present

## 2016-07-28 DIAGNOSIS — K219 Gastro-esophageal reflux disease without esophagitis: Secondary | ICD-10-CM | POA: Diagnosis not present

## 2016-07-28 DIAGNOSIS — J441 Chronic obstructive pulmonary disease with (acute) exacerbation: Secondary | ICD-10-CM | POA: Diagnosis not present

## 2016-07-28 DIAGNOSIS — J189 Pneumonia, unspecified organism: Secondary | ICD-10-CM | POA: Diagnosis not present

## 2016-07-31 ENCOUNTER — Ambulatory Visit (INDEPENDENT_AMBULATORY_CARE_PROVIDER_SITE_OTHER): Payer: Medicare Other | Admitting: Pharmacist

## 2016-07-31 DIAGNOSIS — I5042 Chronic combined systolic (congestive) and diastolic (congestive) heart failure: Secondary | ICD-10-CM | POA: Diagnosis not present

## 2016-07-31 DIAGNOSIS — I110 Hypertensive heart disease with heart failure: Secondary | ICD-10-CM | POA: Diagnosis not present

## 2016-07-31 DIAGNOSIS — I482 Chronic atrial fibrillation, unspecified: Secondary | ICD-10-CM

## 2016-07-31 DIAGNOSIS — J189 Pneumonia, unspecified organism: Secondary | ICD-10-CM | POA: Diagnosis not present

## 2016-07-31 DIAGNOSIS — Z7901 Long term (current) use of anticoagulants: Secondary | ICD-10-CM | POA: Diagnosis not present

## 2016-07-31 DIAGNOSIS — J441 Chronic obstructive pulmonary disease with (acute) exacerbation: Secondary | ICD-10-CM | POA: Diagnosis not present

## 2016-07-31 DIAGNOSIS — J440 Chronic obstructive pulmonary disease with acute lower respiratory infection: Secondary | ICD-10-CM | POA: Diagnosis not present

## 2016-07-31 DIAGNOSIS — Z483 Aftercare following surgery for neoplasm: Secondary | ICD-10-CM | POA: Diagnosis not present

## 2016-07-31 LAB — POCT INR: INR: 2

## 2016-08-01 ENCOUNTER — Other Ambulatory Visit: Payer: Self-pay | Admitting: Family Medicine

## 2016-08-01 DIAGNOSIS — J440 Chronic obstructive pulmonary disease with acute lower respiratory infection: Secondary | ICD-10-CM | POA: Diagnosis not present

## 2016-08-01 DIAGNOSIS — J189 Pneumonia, unspecified organism: Secondary | ICD-10-CM | POA: Diagnosis not present

## 2016-08-01 DIAGNOSIS — J441 Chronic obstructive pulmonary disease with (acute) exacerbation: Secondary | ICD-10-CM | POA: Diagnosis not present

## 2016-08-01 DIAGNOSIS — I110 Hypertensive heart disease with heart failure: Secondary | ICD-10-CM | POA: Diagnosis not present

## 2016-08-01 DIAGNOSIS — I5042 Chronic combined systolic (congestive) and diastolic (congestive) heart failure: Secondary | ICD-10-CM | POA: Diagnosis not present

## 2016-08-01 DIAGNOSIS — Z483 Aftercare following surgery for neoplasm: Secondary | ICD-10-CM | POA: Diagnosis not present

## 2016-08-01 MED ORDER — ESCITALOPRAM OXALATE 20 MG PO TABS: 20.0000 mg | ORAL_TABLET | Freq: Every day | ORAL | 1 refills | Status: DC

## 2016-08-01 MED ORDER — SIMVASTATIN 20 MG PO TABS: 20.0000 mg | ORAL_TABLET | Freq: Every day | ORAL | 1 refills | Status: AC

## 2016-08-01 NOTE — Telephone Encounter (Signed)
Refills done per protocol. Next appt set for 11/2

## 2016-08-03 DIAGNOSIS — J441 Chronic obstructive pulmonary disease with (acute) exacerbation: Secondary | ICD-10-CM | POA: Diagnosis not present

## 2016-08-03 DIAGNOSIS — J440 Chronic obstructive pulmonary disease with acute lower respiratory infection: Secondary | ICD-10-CM | POA: Diagnosis not present

## 2016-08-03 DIAGNOSIS — Z483 Aftercare following surgery for neoplasm: Secondary | ICD-10-CM | POA: Diagnosis not present

## 2016-08-03 DIAGNOSIS — J189 Pneumonia, unspecified organism: Secondary | ICD-10-CM | POA: Diagnosis not present

## 2016-08-03 DIAGNOSIS — I5042 Chronic combined systolic (congestive) and diastolic (congestive) heart failure: Secondary | ICD-10-CM | POA: Diagnosis not present

## 2016-08-03 DIAGNOSIS — I110 Hypertensive heart disease with heart failure: Secondary | ICD-10-CM | POA: Diagnosis not present

## 2016-08-03 LAB — POCT INR: INR: 2.9

## 2016-08-05 ENCOUNTER — Ambulatory Visit (INDEPENDENT_AMBULATORY_CARE_PROVIDER_SITE_OTHER): Payer: Medicare Other | Admitting: Family Medicine

## 2016-08-05 DIAGNOSIS — I10 Essential (primary) hypertension: Secondary | ICD-10-CM | POA: Diagnosis not present

## 2016-08-05 DIAGNOSIS — J441 Chronic obstructive pulmonary disease with (acute) exacerbation: Secondary | ICD-10-CM | POA: Diagnosis not present

## 2016-08-05 DIAGNOSIS — J440 Chronic obstructive pulmonary disease with acute lower respiratory infection: Secondary | ICD-10-CM

## 2016-08-05 DIAGNOSIS — J189 Pneumonia, unspecified organism: Secondary | ICD-10-CM

## 2016-08-07 ENCOUNTER — Ambulatory Visit (INDEPENDENT_AMBULATORY_CARE_PROVIDER_SITE_OTHER): Payer: Self-pay | Admitting: Pharmacist

## 2016-08-07 DIAGNOSIS — I482 Chronic atrial fibrillation, unspecified: Secondary | ICD-10-CM

## 2016-08-07 MED ORDER — WARFARIN SODIUM 1 MG PO TABS: ORAL_TABLET | ORAL | 1 refills | Status: DC

## 2016-08-07 NOTE — Progress Notes (Signed)
No charge for labs or visit - INR check through home monitoring system

## 2016-08-08 ENCOUNTER — Ambulatory Visit (INDEPENDENT_AMBULATORY_CARE_PROVIDER_SITE_OTHER): Payer: Self-pay | Admitting: Pharmacist

## 2016-08-08 DIAGNOSIS — J189 Pneumonia, unspecified organism: Secondary | ICD-10-CM | POA: Diagnosis not present

## 2016-08-08 DIAGNOSIS — I5042 Chronic combined systolic (congestive) and diastolic (congestive) heart failure: Secondary | ICD-10-CM | POA: Diagnosis not present

## 2016-08-08 DIAGNOSIS — J440 Chronic obstructive pulmonary disease with acute lower respiratory infection: Secondary | ICD-10-CM | POA: Diagnosis not present

## 2016-08-08 DIAGNOSIS — Z483 Aftercare following surgery for neoplasm: Secondary | ICD-10-CM | POA: Diagnosis not present

## 2016-08-08 DIAGNOSIS — I482 Chronic atrial fibrillation, unspecified: Secondary | ICD-10-CM

## 2016-08-08 DIAGNOSIS — I110 Hypertensive heart disease with heart failure: Secondary | ICD-10-CM | POA: Diagnosis not present

## 2016-08-08 DIAGNOSIS — J441 Chronic obstructive pulmonary disease with (acute) exacerbation: Secondary | ICD-10-CM | POA: Diagnosis not present

## 2016-08-08 LAB — POCT INR: INR: 3.2

## 2016-08-08 NOTE — Progress Notes (Signed)
Patient ID: Gregg Taylor, male   DOB: 03-06-1946, 70 y.o.   MRN: [id number]  No charge for labs or visit - INR check through home monitoring system

## 2016-08-09 DIAGNOSIS — I110 Hypertensive heart disease with heart failure: Secondary | ICD-10-CM | POA: Diagnosis not present

## 2016-08-09 DIAGNOSIS — J189 Pneumonia, unspecified organism: Secondary | ICD-10-CM | POA: Diagnosis not present

## 2016-08-09 DIAGNOSIS — J441 Chronic obstructive pulmonary disease with (acute) exacerbation: Secondary | ICD-10-CM | POA: Diagnosis not present

## 2016-08-09 DIAGNOSIS — J440 Chronic obstructive pulmonary disease with acute lower respiratory infection: Secondary | ICD-10-CM | POA: Diagnosis not present

## 2016-08-09 DIAGNOSIS — Z483 Aftercare following surgery for neoplasm: Secondary | ICD-10-CM | POA: Diagnosis not present

## 2016-08-09 DIAGNOSIS — I5042 Chronic combined systolic (congestive) and diastolic (congestive) heart failure: Secondary | ICD-10-CM | POA: Diagnosis not present

## 2016-08-10 DIAGNOSIS — J440 Chronic obstructive pulmonary disease with acute lower respiratory infection: Secondary | ICD-10-CM | POA: Diagnosis not present

## 2016-08-10 DIAGNOSIS — Z483 Aftercare following surgery for neoplasm: Secondary | ICD-10-CM | POA: Diagnosis not present

## 2016-08-10 DIAGNOSIS — I5042 Chronic combined systolic (congestive) and diastolic (congestive) heart failure: Secondary | ICD-10-CM | POA: Diagnosis not present

## 2016-08-10 DIAGNOSIS — J441 Chronic obstructive pulmonary disease with (acute) exacerbation: Secondary | ICD-10-CM | POA: Diagnosis not present

## 2016-08-10 DIAGNOSIS — I110 Hypertensive heart disease with heart failure: Secondary | ICD-10-CM | POA: Diagnosis not present

## 2016-08-10 DIAGNOSIS — J189 Pneumonia, unspecified organism: Secondary | ICD-10-CM | POA: Diagnosis not present

## 2016-08-12 DIAGNOSIS — Z483 Aftercare following surgery for neoplasm: Secondary | ICD-10-CM | POA: Diagnosis not present

## 2016-08-12 DIAGNOSIS — J189 Pneumonia, unspecified organism: Secondary | ICD-10-CM | POA: Diagnosis not present

## 2016-08-12 DIAGNOSIS — I110 Hypertensive heart disease with heart failure: Secondary | ICD-10-CM | POA: Diagnosis not present

## 2016-08-12 DIAGNOSIS — J440 Chronic obstructive pulmonary disease with acute lower respiratory infection: Secondary | ICD-10-CM | POA: Diagnosis not present

## 2016-08-12 DIAGNOSIS — J441 Chronic obstructive pulmonary disease with (acute) exacerbation: Secondary | ICD-10-CM | POA: Diagnosis not present

## 2016-08-12 DIAGNOSIS — I5042 Chronic combined systolic (congestive) and diastolic (congestive) heart failure: Secondary | ICD-10-CM | POA: Diagnosis not present

## 2016-08-14 ENCOUNTER — Ambulatory Visit (INDEPENDENT_AMBULATORY_CARE_PROVIDER_SITE_OTHER): Payer: Self-pay | Admitting: Pharmacist

## 2016-08-14 DIAGNOSIS — I482 Chronic atrial fibrillation, unspecified: Secondary | ICD-10-CM

## 2016-08-14 LAB — POCT INR: INR: 2.2

## 2016-08-15 ENCOUNTER — Other Ambulatory Visit: Payer: Self-pay | Admitting: Family Medicine

## 2016-08-15 DIAGNOSIS — I110 Hypertensive heart disease with heart failure: Secondary | ICD-10-CM | POA: Diagnosis not present

## 2016-08-15 DIAGNOSIS — J440 Chronic obstructive pulmonary disease with acute lower respiratory infection: Secondary | ICD-10-CM | POA: Diagnosis not present

## 2016-08-15 DIAGNOSIS — J189 Pneumonia, unspecified organism: Secondary | ICD-10-CM | POA: Diagnosis not present

## 2016-08-15 DIAGNOSIS — Z483 Aftercare following surgery for neoplasm: Secondary | ICD-10-CM | POA: Diagnosis not present

## 2016-08-15 DIAGNOSIS — I5042 Chronic combined systolic (congestive) and diastolic (congestive) heart failure: Secondary | ICD-10-CM | POA: Diagnosis not present

## 2016-08-15 DIAGNOSIS — J441 Chronic obstructive pulmonary disease with (acute) exacerbation: Secondary | ICD-10-CM | POA: Diagnosis not present

## 2016-08-16 DIAGNOSIS — Z483 Aftercare following surgery for neoplasm: Secondary | ICD-10-CM | POA: Diagnosis not present

## 2016-08-16 DIAGNOSIS — J440 Chronic obstructive pulmonary disease with acute lower respiratory infection: Secondary | ICD-10-CM | POA: Diagnosis not present

## 2016-08-16 DIAGNOSIS — J441 Chronic obstructive pulmonary disease with (acute) exacerbation: Secondary | ICD-10-CM | POA: Diagnosis not present

## 2016-08-16 DIAGNOSIS — I5042 Chronic combined systolic (congestive) and diastolic (congestive) heart failure: Secondary | ICD-10-CM | POA: Diagnosis not present

## 2016-08-16 DIAGNOSIS — I110 Hypertensive heart disease with heart failure: Secondary | ICD-10-CM | POA: Diagnosis not present

## 2016-08-16 DIAGNOSIS — J189 Pneumonia, unspecified organism: Secondary | ICD-10-CM | POA: Diagnosis not present

## 2016-08-17 ENCOUNTER — Telehealth: Payer: Self-pay | Admitting: Family Medicine

## 2016-08-17 DIAGNOSIS — Z483 Aftercare following surgery for neoplasm: Secondary | ICD-10-CM | POA: Diagnosis not present

## 2016-08-17 DIAGNOSIS — J441 Chronic obstructive pulmonary disease with (acute) exacerbation: Secondary | ICD-10-CM | POA: Diagnosis not present

## 2016-08-17 DIAGNOSIS — J189 Pneumonia, unspecified organism: Secondary | ICD-10-CM | POA: Diagnosis not present

## 2016-08-17 DIAGNOSIS — J440 Chronic obstructive pulmonary disease with acute lower respiratory infection: Secondary | ICD-10-CM | POA: Diagnosis not present

## 2016-08-17 DIAGNOSIS — I5042 Chronic combined systolic (congestive) and diastolic (congestive) heart failure: Secondary | ICD-10-CM | POA: Diagnosis not present

## 2016-08-17 DIAGNOSIS — I110 Hypertensive heart disease with heart failure: Secondary | ICD-10-CM | POA: Diagnosis not present

## 2016-08-18 DIAGNOSIS — J189 Pneumonia, unspecified organism: Secondary | ICD-10-CM | POA: Diagnosis not present

## 2016-08-18 DIAGNOSIS — I110 Hypertensive heart disease with heart failure: Secondary | ICD-10-CM | POA: Diagnosis not present

## 2016-08-18 DIAGNOSIS — Z483 Aftercare following surgery for neoplasm: Secondary | ICD-10-CM | POA: Diagnosis not present

## 2016-08-18 DIAGNOSIS — I5042 Chronic combined systolic (congestive) and diastolic (congestive) heart failure: Secondary | ICD-10-CM | POA: Diagnosis not present

## 2016-08-18 DIAGNOSIS — J441 Chronic obstructive pulmonary disease with (acute) exacerbation: Secondary | ICD-10-CM | POA: Diagnosis not present

## 2016-08-18 DIAGNOSIS — J440 Chronic obstructive pulmonary disease with acute lower respiratory infection: Secondary | ICD-10-CM | POA: Diagnosis not present

## 2016-08-21 NOTE — Telephone Encounter (Signed)
Called and told pt we still have not received

## 2016-08-22 ENCOUNTER — Ambulatory Visit: Payer: Self-pay | Admitting: Pharmacist

## 2016-08-22 ENCOUNTER — Telehealth: Payer: Self-pay | Admitting: Pharmacist

## 2016-08-22 DIAGNOSIS — I482 Chronic atrial fibrillation, unspecified: Secondary | ICD-10-CM

## 2016-08-22 DIAGNOSIS — I5042 Chronic combined systolic (congestive) and diastolic (congestive) heart failure: Secondary | ICD-10-CM | POA: Diagnosis not present

## 2016-08-22 DIAGNOSIS — Z483 Aftercare following surgery for neoplasm: Secondary | ICD-10-CM | POA: Diagnosis not present

## 2016-08-22 DIAGNOSIS — J440 Chronic obstructive pulmonary disease with acute lower respiratory infection: Secondary | ICD-10-CM | POA: Diagnosis not present

## 2016-08-22 DIAGNOSIS — I110 Hypertensive heart disease with heart failure: Secondary | ICD-10-CM | POA: Diagnosis not present

## 2016-08-22 DIAGNOSIS — J441 Chronic obstructive pulmonary disease with (acute) exacerbation: Secondary | ICD-10-CM | POA: Diagnosis not present

## 2016-08-22 DIAGNOSIS — J189 Pneumonia, unspecified organism: Secondary | ICD-10-CM | POA: Diagnosis not present

## 2016-08-22 LAB — POCT INR: INR: 2.1

## 2016-08-22 NOTE — Progress Notes (Signed)
No charge for labs or visit - INR check through home monitoring system

## 2016-08-22 NOTE — Telephone Encounter (Signed)
Opened in error

## 2016-08-23 DIAGNOSIS — J189 Pneumonia, unspecified organism: Secondary | ICD-10-CM | POA: Diagnosis not present

## 2016-08-23 DIAGNOSIS — J441 Chronic obstructive pulmonary disease with (acute) exacerbation: Secondary | ICD-10-CM | POA: Diagnosis not present

## 2016-08-23 DIAGNOSIS — J440 Chronic obstructive pulmonary disease with acute lower respiratory infection: Secondary | ICD-10-CM | POA: Diagnosis not present

## 2016-08-23 DIAGNOSIS — I5042 Chronic combined systolic (congestive) and diastolic (congestive) heart failure: Secondary | ICD-10-CM | POA: Diagnosis not present

## 2016-08-23 DIAGNOSIS — Z483 Aftercare following surgery for neoplasm: Secondary | ICD-10-CM | POA: Diagnosis not present

## 2016-08-23 DIAGNOSIS — I110 Hypertensive heart disease with heart failure: Secondary | ICD-10-CM | POA: Diagnosis not present

## 2016-08-25 DIAGNOSIS — I110 Hypertensive heart disease with heart failure: Secondary | ICD-10-CM | POA: Diagnosis not present

## 2016-08-25 DIAGNOSIS — J189 Pneumonia, unspecified organism: Secondary | ICD-10-CM | POA: Diagnosis not present

## 2016-08-25 DIAGNOSIS — Z483 Aftercare following surgery for neoplasm: Secondary | ICD-10-CM | POA: Diagnosis not present

## 2016-08-25 DIAGNOSIS — J440 Chronic obstructive pulmonary disease with acute lower respiratory infection: Secondary | ICD-10-CM | POA: Diagnosis not present

## 2016-08-25 DIAGNOSIS — I5042 Chronic combined systolic (congestive) and diastolic (congestive) heart failure: Secondary | ICD-10-CM | POA: Diagnosis not present

## 2016-08-25 DIAGNOSIS — J441 Chronic obstructive pulmonary disease with (acute) exacerbation: Secondary | ICD-10-CM | POA: Diagnosis not present

## 2016-08-29 DIAGNOSIS — Z483 Aftercare following surgery for neoplasm: Secondary | ICD-10-CM | POA: Diagnosis not present

## 2016-08-29 DIAGNOSIS — I5042 Chronic combined systolic (congestive) and diastolic (congestive) heart failure: Secondary | ICD-10-CM | POA: Diagnosis not present

## 2016-08-29 DIAGNOSIS — J440 Chronic obstructive pulmonary disease with acute lower respiratory infection: Secondary | ICD-10-CM | POA: Diagnosis not present

## 2016-08-29 DIAGNOSIS — I110 Hypertensive heart disease with heart failure: Secondary | ICD-10-CM | POA: Diagnosis not present

## 2016-08-29 DIAGNOSIS — Z7901 Long term (current) use of anticoagulants: Secondary | ICD-10-CM | POA: Diagnosis not present

## 2016-08-29 DIAGNOSIS — J189 Pneumonia, unspecified organism: Secondary | ICD-10-CM | POA: Diagnosis not present

## 2016-08-29 DIAGNOSIS — I482 Chronic atrial fibrillation: Secondary | ICD-10-CM | POA: Diagnosis not present

## 2016-08-29 DIAGNOSIS — J441 Chronic obstructive pulmonary disease with (acute) exacerbation: Secondary | ICD-10-CM | POA: Diagnosis not present

## 2016-08-29 LAB — POCT INR: INR: 1.9

## 2016-08-30 ENCOUNTER — Ambulatory Visit (INDEPENDENT_AMBULATORY_CARE_PROVIDER_SITE_OTHER): Payer: Self-pay | Admitting: Pharmacist

## 2016-08-30 DIAGNOSIS — I5042 Chronic combined systolic (congestive) and diastolic (congestive) heart failure: Secondary | ICD-10-CM | POA: Diagnosis not present

## 2016-08-30 DIAGNOSIS — I482 Chronic atrial fibrillation, unspecified: Secondary | ICD-10-CM

## 2016-08-30 DIAGNOSIS — J189 Pneumonia, unspecified organism: Secondary | ICD-10-CM | POA: Diagnosis not present

## 2016-08-30 DIAGNOSIS — J441 Chronic obstructive pulmonary disease with (acute) exacerbation: Secondary | ICD-10-CM | POA: Diagnosis not present

## 2016-08-30 DIAGNOSIS — J440 Chronic obstructive pulmonary disease with acute lower respiratory infection: Secondary | ICD-10-CM | POA: Diagnosis not present

## 2016-08-30 DIAGNOSIS — Z483 Aftercare following surgery for neoplasm: Secondary | ICD-10-CM | POA: Diagnosis not present

## 2016-08-30 DIAGNOSIS — I110 Hypertensive heart disease with heart failure: Secondary | ICD-10-CM | POA: Diagnosis not present

## 2016-09-04 LAB — POCT INR: INR: 1.8

## 2016-09-05 ENCOUNTER — Ambulatory Visit (INDEPENDENT_AMBULATORY_CARE_PROVIDER_SITE_OTHER): Payer: Self-pay | Admitting: Pharmacist

## 2016-09-05 DIAGNOSIS — I482 Chronic atrial fibrillation, unspecified: Secondary | ICD-10-CM

## 2016-09-07 ENCOUNTER — Encounter (INDEPENDENT_AMBULATORY_CARE_PROVIDER_SITE_OTHER): Payer: Self-pay

## 2016-09-07 ENCOUNTER — Ambulatory Visit (INDEPENDENT_AMBULATORY_CARE_PROVIDER_SITE_OTHER): Payer: Medicare Other | Admitting: Family Medicine

## 2016-09-07 ENCOUNTER — Encounter: Payer: Self-pay | Admitting: Family Medicine

## 2016-09-07 VITALS — BP 137/84 | HR 105 | Ht 68.0 in | Wt >= 6400 oz

## 2016-09-07 DIAGNOSIS — M5126 Other intervertebral disc displacement, lumbar region: Secondary | ICD-10-CM

## 2016-09-07 DIAGNOSIS — IMO0002 Reserved for concepts with insufficient information to code with codable children: Secondary | ICD-10-CM

## 2016-09-07 DIAGNOSIS — Z23 Encounter for immunization: Secondary | ICD-10-CM

## 2016-09-07 DIAGNOSIS — E1140 Type 2 diabetes mellitus with diabetic neuropathy, unspecified: Secondary | ICD-10-CM | POA: Diagnosis not present

## 2016-09-07 DIAGNOSIS — I482 Chronic atrial fibrillation, unspecified: Secondary | ICD-10-CM

## 2016-09-07 DIAGNOSIS — E034 Atrophy of thyroid (acquired): Secondary | ICD-10-CM

## 2016-09-07 DIAGNOSIS — Z7901 Long term (current) use of anticoagulants: Secondary | ICD-10-CM

## 2016-09-07 DIAGNOSIS — R0602 Shortness of breath: Secondary | ICD-10-CM

## 2016-09-07 DIAGNOSIS — M5416 Radiculopathy, lumbar region: Secondary | ICD-10-CM | POA: Diagnosis not present

## 2016-09-07 DIAGNOSIS — E1165 Type 2 diabetes mellitus with hyperglycemia: Secondary | ICD-10-CM | POA: Diagnosis not present

## 2016-09-07 DIAGNOSIS — G894 Chronic pain syndrome: Secondary | ICD-10-CM

## 2016-09-07 MED ORDER — OXYCODONE-ACETAMINOPHEN 10-325 MG PO TABS: 1.0000 | ORAL_TABLET | Freq: Four times a day (QID) | ORAL | 0 refills | Status: AC

## 2016-09-07 NOTE — Progress Notes (Signed)
Subjective:  Patient ID: Gregg Taylor, male    DOB: 1946-07-09  Age: 70 y.o. MRN: [id number]  CC: Diabetes (pt here today for his routine 3 month check up and flu shot, no problems voiced)   HPI Gregg Taylor presents for  follow-up of hypertension. Patient has no history of headache chest pain or shortness of breath or recent cough. Patient also denies symptoms of TIA such as numbness weakness lateralizing. Patient checks  blood pressure at home and has not had any elevated readings recently. Patient denies side effects from his medication. States taking it regularly.  Patient also  in for follow-up of elevated cholesterol. Doing well without complaints on current medication. Denies side effects of statin including myalgia and arthralgia and nausea. Also in today for liver function testing. Currently no chest pain, shortness of breath or other cardiovascular related symptoms noted.  Follow-up of diabetes. Patient does check blood sugar at home. Readings run between 100 and 110 fasting Patient denies symptoms such as polyuria, polydipsia, excessive hunger, nausea No significant hypoglycemic spells noted. Medications as noted below. Taking them regularly without complication/adverse reaction being reported today.    History Gregg Taylor has a past medical history of Arthritis; Cancer [facility]); CHF (congestive heart failure) ([place]); Depression; Diabetes mellitus without complication ([place]); Hyperlipidemia; and Hypertension.   He has a past surgical history that includes Appendectomy; Cholecystectomy; Gastric bypass (1991); and Fracture surgery.   His family history includes Alcohol abuse in his father; Cancer in his mother; Depression in his brother and father; Heart disease in his father; Hypertension in his brother and father; Stroke in his brother.He reports that he quit smoking about 13 years ago. His smoking use included Cigarettes. He started smoking about 38 years ago. He smoked 0.50 packs per day. He  has never used smokeless tobacco. He reports that he does not drink alcohol or use drugs.  Current Outpatient Prescriptions on File Prior to Visit  Medication Sig Dispense Refill  . ALPRAZolam (XANAX) 1 MG tablet Take 1 tablet (1 mg total) by mouth 3 (three) times daily. 90 tablet 5  . Ascorbic Acid (VITAMIN C) 1000 MG tablet Take 1,000 mg by mouth.    Marland Kitchen aspirin 81 MG chewable tablet Chew 81 mg by mouth daily.     . blood glucose meter kit and supplies KIT Dispense based on patient and insurance preference. Use up to four times daily as directed. (FOR ICD-9 250.00, 250.01). 1 each 11  . bumetanide (BUMEX) 2 MG tablet Take 2 tablets (4 mg total) by mouth 2 (two) times daily. 120 tablet 6  . Cholecalciferol (VITAMIN D3) 2000 UNITS TABS Take 2,000 Units by mouth.    . escitalopram (LEXAPRO) 20 MG tablet Take 1 tablet (20 mg total) by mouth daily. 30 tablet 1  . gabapentin (NEURONTIN) 300 MG capsule TAKE TWO CAPSULES BY MOUTH TWICE DAILY 360 capsule 0  . hydrALAZINE (APRESOLINE) 25 MG tablet Take 2 tablets (50 mg total) by mouth 3 (three) times daily. 180 tablet 2  . isosorbide dinitrate (ISORDIL) 30 MG tablet Take 30 mg by mouth daily.    Marland Kitchen levocetirizine (XYZAL) 5 MG tablet Take 1 tablet (5 mg total) by mouth daily. 90 tablet 1  . levothyroxine (SYNTHROID, LEVOTHROID) 137 MCG tablet Take 1 tablet (137 mcg total) by mouth every morning. 90 tablet 2  . metoprolol succinate (TOPROL-XL) 25 MG 24 hr tablet Take 1 tablet (25 mg total) by mouth 2 (two) times daily. 60 tablet 5  . mirtazapine (  REMERON) 30 MG tablet TAKE TWO TABLETS BY MOUTH AT BEDTIME FOR SLEEP AND DEPRESSION 60 tablet 2  . Multiple Vitamin (MULTIVITAMIN) tablet Take 1 tablet by mouth.    . OXYGEN Inhale 2 L into the lungs.    . pantoprazole (PROTONIX) 40 MG tablet Take 1 tablet (40 mg total) by mouth 2 (two) times daily. 180 tablet 1  . pilocarpine (SALAGEN) 5 MG tablet Take 5 mg by mouth 3 (three) times daily.    . potassium chloride  (K-DUR) 10 MEQ tablet Take 4 tablets (40 mEq total) by mouth 2 (two) times daily. 360 tablet 1  . quinapril (ACCUPRIL) 20 MG tablet Take 1 tablet (20 mg total) by mouth 2 (two) times daily. 180 tablet 0  . Respiratory Therapy Supplies MISC CPAP at HS.    . simvastatin (ZOCOR) 20 MG tablet Take 1 tablet (20 mg total) by mouth at bedtime. 30 tablet 1  . spironolactone (ALDACTONE) 25 MG tablet Take 1 tablet (25 mg total) by mouth daily. 90 tablet 1  . temazepam (RESTORIL) 30 MG capsule Take 1 capsule (30 mg total) by mouth at bedtime. 30 capsule 5  . vitamin B-12 (CYANOCOBALAMIN) 500 MCG tablet Take 500 mcg by mouth.    . warfarin (COUMADIN) 1 MG tablet Take 1 tablet daily along with 1 tablet of 31m to equal 630mtotal. 30 tablet 1  . warfarin (COUMADIN) 5 MG tablet Take 1 tablet (5 mg total) by mouth daily. Along with 73m55mablet for total dose of 9mg69mtal. 90 tablet 1   No current facility-administered medications on file prior to visit.     ROS Review of Systems  Constitutional: Negative for chills, diaphoresis, fever and unexpected weight change.  HENT: Negative for congestion, hearing loss, rhinorrhea and sore throat.   Eyes: Negative for visual disturbance.  Respiratory: Positive for shortness of breath (O2 dependent). Negative for cough.   Cardiovascular: Negative for chest pain.  Gastrointestinal: Negative for abdominal pain, constipation and diarrhea.  Genitourinary: Negative for dysuria and flank pain.  Musculoskeletal: Negative for arthralgias and joint swelling.  Skin: Negative for rash.  Neurological: Negative for dizziness and headaches.  Psychiatric/Behavioral: Negative for dysphoric mood and sleep disturbance.    Objective:  BP 137/84   Pulse (!) 105   Ht _0  (1.727 m)   Wt (!) 422 lb 3 oz (191.5 kg)   SpO2 (!) 88% Comment: on 2 liters of oxygen  BMI 64.19 kg/m   BP Readings from Last 3 Encounters:  09/07/16 137/84  07/13/16 137/79  06/06/16 126/71    Wt  Readings from Last 3 Encounters:  09/07/16 (!) 422 lb 3 oz (191.5 kg)  07/13/16 (!) 428 lb (194.1 kg)  06/06/16 (!) 406 lb (184.2 kg)     Physical Exam  Constitutional: He is oriented to person, place, and time. He appears well-developed and well-nourished. No distress.  Morbidly obese, wearing O2 cannula (4L)   HENT:  Head: Normocephalic and atraumatic.  Right Ear: External ear normal.  Left Ear: External ear normal.  Nose: Nose normal.  Mouth/Throat: Oropharynx is clear and moist.  Eyes: Conjunctivae and EOM are normal. Pupils are equal, round, and reactive to light.  Neck: Normal range of motion. Neck supple. No thyromegaly present.  Cardiovascular: Normal rate, regular rhythm and normal heart sounds.   No murmur heard. Pulmonary/Chest: Effort normal. No respiratory distress. He has wheezes. He has no rales.  Abdominal: Soft. Bowel sounds are normal. He exhibits no distension. There  is no tenderness.  Musculoskeletal:  Ambulating via scooter   Lymphadenopathy:    He has no cervical adenopathy.  Neurological: He is alert and oriented to person, place, and time. He has normal reflexes.  Skin: Skin is warm and dry.  Psychiatric: His behavior is normal. Judgment and thought content normal. His mood appears anxious. His speech is rapid and/or pressured.    Lab Results  Component Value Date   HGBA1C 5.9 10/22/2015   HGBA1C 5.8 08/03/2015   HGBA1C 5.9 02/19/2015    Lab Results  Component Value Date   WBC 8.4 07/13/2016   HGB 11.5 (A) 09/22/2015   HCT 33.6 (L) 07/13/2016   PLT 368 07/13/2016   GLUCOSE 98 07/13/2016   CHOL 160 06/06/2016   TRIG 135 06/06/2016   HDL 66 06/06/2016   LDLCALC 67 06/06/2016   ALT 15 07/13/2016   AST 18 07/13/2016   NA 148 (H) 07/13/2016   K 4.1 07/13/2016   CL 100 07/13/2016   CREATININE 1.10 07/13/2016   BUN 15 07/13/2016   CO2 32 (H) 07/13/2016   TSH 4.320 06/06/2016   INR 1.8 09/04/2016   HGBA1C 5.9 10/22/2015    Patient was  never admitted.  Assessment & Plan:   Gregg Taylor was seen today for diabetes.  Diagnoses and all orders for this visit:  Chronic atrial fibrillation ([place]) -     CBC with Differential/Platelet -     CMP14+EGFR  Long term current use of anticoagulant therapy -     CBC with Differential/Platelet -     CMP14+EGFR  Hypothyroidism due to acquired atrophy of thyroid -     CBC with Differential/Platelet -     CMP14+EGFR  Type 2 diabetes mellitus, uncontrolled, with neuropathy (HCC) -     CBC with Differential/Platelet -     CMP14+EGFR -     Bayer DCA Hb A1c Waived -     Lipid panel  Chronic pain syndrome -     CBC with Differential/Platelet -     CMP14+EGFR -     oxyCODONE-acetaminophen (PERCOCET) 10-325 MG tablet; Take 1 tablet by mouth 4 (four) times daily. -     oxyCODONE-acetaminophen (PERCOCET) 10-325 MG tablet; Take 1 tablet by mouth 4 (four) times daily. -     oxyCODONE-acetaminophen (PERCOCET) 10-325 MG tablet; Take 1 tablet by mouth 4 (four) times daily.  Lumbar herniated disc -     CBC with Differential/Platelet -     CMP14+EGFR  Right lumbar radiculopathy -     CBC with Differential/Platelet -     CMP14+EGFR -     oxyCODONE-acetaminophen (PERCOCET) 10-325 MG tablet; Take 1 tablet by mouth 4 (four) times daily. -     oxyCODONE-acetaminophen (PERCOCET) 10-325 MG tablet; Take 1 tablet by mouth 4 (four) times daily. -     oxyCODONE-acetaminophen (PERCOCET) 10-325 MG tablet; Take 1 tablet by mouth 4 (four) times daily.  Shortness of breath -     Brain natriuretic peptide   I am having [name] maintain his vitamin C, aspirin, Vitamin D3, multivitamin, OXYGEN, Respiratory Therapy Supplies, vitamin B-12, blood glucose meter kit and supplies, ALPRAZolam, hydrALAZINE, levocetirizine, levothyroxine, metoprolol succinate, spironolactone, bumetanide, temazepam, mirtazapine, gabapentin, quinapril, pantoprazole, potassium chloride, pilocarpine, isosorbide dinitrate, warfarin,  escitalopram, simvastatin, warfarin, oxyCODONE-acetaminophen, oxyCODONE-acetaminophen, and oxyCODONE-acetaminophen.  Meds ordered this encounter  Medications  . oxyCODONE-acetaminophen (PERCOCET) 10-325 MG tablet    Sig: Take 1 tablet by mouth 4 (four) times daily.    Dispense:  120 tablet  Refill:  0    Do not fill until 10/07/2016  . oxyCODONE-acetaminophen (PERCOCET) 10-325 MG tablet    Sig: Take 1 tablet by mouth 4 (four) times daily.    Dispense:  120 tablet    Refill:  0    Do not fill until 11/05/2016  . oxyCODONE-acetaminophen (PERCOCET) 10-325 MG tablet    Sig: Take 1 tablet by mouth 4 (four) times daily.    Dispense:  120 tablet    Refill:  0     Follow-up: Return in about 3 months (around 12/08/2016).  Claretta Fraise, M.D.

## 2016-09-08 ENCOUNTER — Encounter: Payer: Self-pay | Admitting: Family Medicine

## 2016-09-08 LAB — PROTIME-INR

## 2016-09-11 ENCOUNTER — Ambulatory Visit: Payer: Self-pay | Admitting: Pharmacist

## 2016-09-11 ENCOUNTER — Telehealth: Payer: Self-pay | Admitting: Family Medicine

## 2016-09-11 DIAGNOSIS — I482 Chronic atrial fibrillation, unspecified: Secondary | ICD-10-CM

## 2016-09-11 LAB — POCT INR: INR: 2

## 2016-09-11 MED ORDER — POTASSIUM CHLORIDE ER 10 MEQ PO TBCR: 40.0000 meq | EXTENDED_RELEASE_TABLET | Freq: Two times a day (BID) | ORAL | 1 refills | Status: AC

## 2016-09-11 MED ORDER — WARFARIN SODIUM 1 MG PO TABS: ORAL_TABLET | ORAL | 1 refills | Status: AC

## 2016-09-11 MED ORDER — WARFARIN SODIUM 5 MG PO TABS: 5.0000 mg | ORAL_TABLET | Freq: Every day | ORAL | 1 refills | Status: AC

## 2016-09-11 NOTE — Telephone Encounter (Signed)
Patient will be out of potassium tomorrow -  States he is taking 4 tablets twice a day.  Also need rx for wrarfarin 88m and 517mRx's send in

## 2016-09-19 LAB — POCT INR: INR: 1.5

## 2016-09-20 ENCOUNTER — Ambulatory Visit (INDEPENDENT_AMBULATORY_CARE_PROVIDER_SITE_OTHER): Payer: Self-pay | Admitting: Pharmacist

## 2016-09-20 ENCOUNTER — Telehealth: Payer: Self-pay | Admitting: *Deleted

## 2016-09-20 DIAGNOSIS — I482 Chronic atrial fibrillation, unspecified: Secondary | ICD-10-CM

## 2016-09-20 DIAGNOSIS — T1490XA Injury, unspecified, initial encounter: Secondary | ICD-10-CM | POA: Diagnosis not present

## 2016-09-20 NOTE — Telephone Encounter (Signed)
I just received call on Gregg Taylor. INR was 1.5 yesterday. I don't recall seeing a fax on this

## 2016-09-26 ENCOUNTER — Ambulatory Visit (INDEPENDENT_AMBULATORY_CARE_PROVIDER_SITE_OTHER): Payer: Self-pay | Admitting: Pharmacist

## 2016-09-26 DIAGNOSIS — I482 Chronic atrial fibrillation, unspecified: Secondary | ICD-10-CM

## 2016-09-26 DIAGNOSIS — Z7901 Long term (current) use of anticoagulants: Secondary | ICD-10-CM | POA: Diagnosis not present

## 2016-09-26 LAB — POCT INR: INR: 2.3

## 2016-10-02 ENCOUNTER — Ambulatory Visit (INDEPENDENT_AMBULATORY_CARE_PROVIDER_SITE_OTHER): Payer: Self-pay | Admitting: Pharmacist

## 2016-10-02 ENCOUNTER — Telehealth: Payer: Self-pay | Admitting: Family Medicine

## 2016-10-02 ENCOUNTER — Telehealth: Payer: Self-pay | Admitting: Pharmacist

## 2016-10-02 DIAGNOSIS — I482 Chronic atrial fibrillation, unspecified: Secondary | ICD-10-CM

## 2016-10-02 LAB — POCT INR: INR: 2.9

## 2016-10-02 MED ORDER — LEVOCETIRIZINE DIHYDROCHLORIDE 5 MG PO TABS: 5.0000 mg | ORAL_TABLET | Freq: Every day | ORAL | 1 refills | Status: AC

## 2016-10-02 MED ORDER — TEMAZEPAM 30 MG PO CAPS: 30.0000 mg | ORAL_CAPSULE | Freq: Every day | ORAL | 5 refills | Status: AC

## 2016-10-02 MED ORDER — ESCITALOPRAM OXALATE 20 MG PO TABS: 20.0000 mg | ORAL_TABLET | Freq: Every day | ORAL | 5 refills | Status: AC

## 2016-10-02 NOTE — Telephone Encounter (Signed)
Rx's sent to pharmacy.  

## 2016-10-02 NOTE — Telephone Encounter (Signed)
Okay at current level for 6 mos. Thanks ws

## 2016-10-09 ENCOUNTER — Encounter: Payer: Medicare Other | Admitting: Pharmacist

## 2016-10-09 ENCOUNTER — Ambulatory Visit (INDEPENDENT_AMBULATORY_CARE_PROVIDER_SITE_OTHER): Payer: Medicare Other | Admitting: Pharmacist

## 2016-10-09 DIAGNOSIS — I482 Chronic atrial fibrillation, unspecified: Secondary | ICD-10-CM

## 2016-10-09 LAB — POCT INR: INR: 4.3

## 2016-10-09 NOTE — Consult Note (Signed)
Patient checks INR at home with Home INR monitoring.  Last weeks dose was 32m daily except 78mM/F Billing once per month interupertation fee.  Patient diagnosis - chronic atrial fibrillation INR was 4.3 today.  No warfarin today 10/09/16.  Then decrease warfarin dose to 59m41md except 6mg27mF Procedure code if [id number]

## 2016-10-16 ENCOUNTER — Ambulatory Visit: Payer: Self-pay | Admitting: Pharmacist

## 2016-10-16 DIAGNOSIS — I482 Chronic atrial fibrillation, unspecified: Secondary | ICD-10-CM

## 2016-10-16 LAB — POCT INR: INR: 2.7

## 2016-10-16 NOTE — Consult Note (Signed)
No charge for labs or visit - INR check through home monitoring system  

## 2016-10-24 ENCOUNTER — Ambulatory Visit (INDEPENDENT_AMBULATORY_CARE_PROVIDER_SITE_OTHER): Payer: Self-pay | Admitting: Pharmacist

## 2016-10-24 DIAGNOSIS — I482 Chronic atrial fibrillation, unspecified: Secondary | ICD-10-CM

## 2016-10-24 DIAGNOSIS — Z7901 Long term (current) use of anticoagulants: Secondary | ICD-10-CM | POA: Diagnosis not present

## 2016-10-24 LAB — POCT INR: INR: 2.7

## 2016-10-24 NOTE — Progress Notes (Signed)
No charge for labs or visit - INR check through home monitoring system  

## 2016-10-31 LAB — POCT INR: INR: 2.5

## 2016-11-02 ENCOUNTER — Ambulatory Visit (INDEPENDENT_AMBULATORY_CARE_PROVIDER_SITE_OTHER): Payer: Self-pay | Admitting: Pharmacist

## 2016-11-02 DIAGNOSIS — I482 Chronic atrial fibrillation, unspecified: Secondary | ICD-10-CM

## 2016-11-07 ENCOUNTER — Ambulatory Visit (INDEPENDENT_AMBULATORY_CARE_PROVIDER_SITE_OTHER): Payer: Medicare Other | Admitting: Pharmacist

## 2016-11-07 DIAGNOSIS — I482 Chronic atrial fibrillation, unspecified: Secondary | ICD-10-CM

## 2016-11-07 LAB — POCT INR: INR: 2.1

## 2016-11-07 NOTE — Progress Notes (Signed)
Subjective:     Indication: atrial fibrillation Bleeding signs/symptoms: None Thromboembolic signs/symptoms: None  Missed Coumadin doses: None Medication changes: no Dietary changes: No Bacterial/viral infection: no Other concerns: no    Objective:    INR Today: 2.1 Current dose: 67m MWF and 557mall other days.    Assessment:    Therapeutic INR for goal of 2-3   Plan:    1. New dose: no change   2. Next INR: 1 week    Patient ID: Gregg Chaplinmale   DOB: [id number].   MRN: [id number]

## 2016-11-14 LAB — POCT INR: INR: 3.6

## 2016-11-15 ENCOUNTER — Ambulatory Visit (INDEPENDENT_AMBULATORY_CARE_PROVIDER_SITE_OTHER): Payer: Self-pay | Admitting: Pharmacist

## 2016-11-15 ENCOUNTER — Other Ambulatory Visit: Payer: Self-pay | Admitting: Family Medicine

## 2016-11-15 DIAGNOSIS — I482 Chronic atrial fibrillation, unspecified: Secondary | ICD-10-CM

## 2016-11-15 MED ORDER — BUMETANIDE 2 MG PO TABS: 4.0000 mg | ORAL_TABLET | Freq: Two times a day (BID) | ORAL | 1 refills | Status: AC

## 2016-11-15 MED ORDER — METOPROLOL SUCCINATE ER 25 MG PO TB24: 25.0000 mg | ORAL_TABLET | Freq: Two times a day (BID) | ORAL | 1 refills | Status: AC

## 2016-11-15 NOTE — Telephone Encounter (Signed)
Rx refilled #30 day with 1 RF - sees Dr Livia Snellen in February.  Patient hold warfarin for 1 day and then decreased dose to 61m daily.

## 2016-11-15 NOTE — Telephone Encounter (Signed)
Company called with out of range INR for yesterday. It was 3.6. Please advise

## 2016-11-20 ENCOUNTER — Ambulatory Visit (INDEPENDENT_AMBULATORY_CARE_PROVIDER_SITE_OTHER): Payer: Self-pay | Admitting: Pharmacist

## 2016-11-20 DIAGNOSIS — I482 Chronic atrial fibrillation, unspecified: Secondary | ICD-10-CM

## 2016-11-20 DIAGNOSIS — Z7901 Long term (current) use of anticoagulants: Secondary | ICD-10-CM | POA: Diagnosis not present

## 2016-11-20 LAB — POCT INR: INR: 3.1

## 2016-11-28 DIAGNOSIS — R05 Cough: Secondary | ICD-10-CM | POA: Diagnosis not present

## 2016-11-28 DIAGNOSIS — R0602 Shortness of breath: Secondary | ICD-10-CM | POA: Diagnosis not present

## 2016-11-28 DIAGNOSIS — I4891 Unspecified atrial fibrillation: Secondary | ICD-10-CM | POA: Diagnosis not present

## 2016-11-28 DIAGNOSIS — E785 Hyperlipidemia, unspecified: Secondary | ICD-10-CM | POA: Diagnosis present

## 2016-11-28 DIAGNOSIS — I083 Combined rheumatic disorders of mitral, aortic and tricuspid valves: Secondary | ICD-10-CM | POA: Diagnosis not present

## 2016-11-28 DIAGNOSIS — I272 Pulmonary hypertension, unspecified: Secondary | ICD-10-CM | POA: Diagnosis not present

## 2016-11-28 DIAGNOSIS — I5043 Acute on chronic combined systolic (congestive) and diastolic (congestive) heart failure: Secondary | ICD-10-CM | POA: Diagnosis not present

## 2016-11-28 DIAGNOSIS — I5023 Acute on chronic systolic (congestive) heart failure: Secondary | ICD-10-CM | POA: Diagnosis not present

## 2016-11-28 DIAGNOSIS — E875 Hyperkalemia: Secondary | ICD-10-CM | POA: Diagnosis present

## 2016-11-28 DIAGNOSIS — T502X5A Adverse effect of carbonic-anhydrase inhibitors, benzothiadiazides and other diuretics, initial encounter: Secondary | ICD-10-CM | POA: Diagnosis present

## 2016-11-28 DIAGNOSIS — R0600 Dyspnea, unspecified: Secondary | ICD-10-CM | POA: Diagnosis not present

## 2016-11-28 DIAGNOSIS — J449 Chronic obstructive pulmonary disease, unspecified: Secondary | ICD-10-CM | POA: Diagnosis present

## 2016-11-28 DIAGNOSIS — M1380 Other specified arthritis, unspecified site: Secondary | ICD-10-CM | POA: Diagnosis not present

## 2016-11-28 DIAGNOSIS — R069 Unspecified abnormalities of breathing: Secondary | ICD-10-CM | POA: Diagnosis not present

## 2016-11-28 DIAGNOSIS — E039 Hypothyroidism, unspecified: Secondary | ICD-10-CM | POA: Diagnosis present

## 2016-11-28 DIAGNOSIS — F419 Anxiety disorder, unspecified: Secondary | ICD-10-CM | POA: Diagnosis not present

## 2016-11-28 DIAGNOSIS — I110 Hypertensive heart disease with heart failure: Secondary | ICD-10-CM | POA: Diagnosis not present

## 2016-11-28 DIAGNOSIS — Z6841 Body Mass Index (BMI) 40.0 and over, adult: Secondary | ICD-10-CM | POA: Diagnosis not present

## 2016-11-28 DIAGNOSIS — E8770 Fluid overload, unspecified: Secondary | ICD-10-CM | POA: Diagnosis present

## 2016-11-28 DIAGNOSIS — M6281 Muscle weakness (generalized): Secondary | ICD-10-CM | POA: Diagnosis not present

## 2016-11-28 DIAGNOSIS — K219 Gastro-esophageal reflux disease without esophagitis: Secondary | ICD-10-CM | POA: Diagnosis present

## 2016-11-28 DIAGNOSIS — N390 Urinary tract infection, site not specified: Secondary | ICD-10-CM | POA: Diagnosis not present

## 2016-11-28 DIAGNOSIS — G609 Hereditary and idiopathic neuropathy, unspecified: Secondary | ICD-10-CM | POA: Diagnosis not present

## 2016-11-28 DIAGNOSIS — I482 Chronic atrial fibrillation: Secondary | ICD-10-CM | POA: Diagnosis not present

## 2016-11-28 DIAGNOSIS — R1310 Dysphagia, unspecified: Secondary | ICD-10-CM | POA: Diagnosis not present

## 2016-11-28 DIAGNOSIS — J90 Pleural effusion, not elsewhere classified: Secondary | ICD-10-CM | POA: Diagnosis not present

## 2016-11-28 DIAGNOSIS — J9611 Chronic respiratory failure with hypoxia: Secondary | ICD-10-CM | POA: Diagnosis not present

## 2016-11-28 DIAGNOSIS — E873 Alkalosis: Secondary | ICD-10-CM | POA: Diagnosis not present

## 2016-11-28 DIAGNOSIS — G8929 Other chronic pain: Secondary | ICD-10-CM | POA: Diagnosis not present

## 2016-11-28 DIAGNOSIS — E870 Hyperosmolality and hypernatremia: Secondary | ICD-10-CM | POA: Diagnosis not present

## 2016-11-28 DIAGNOSIS — E876 Hypokalemia: Secondary | ICD-10-CM | POA: Diagnosis not present

## 2016-11-28 DIAGNOSIS — R269 Unspecified abnormalities of gait and mobility: Secondary | ICD-10-CM | POA: Diagnosis not present

## 2016-11-28 DIAGNOSIS — Z7409 Other reduced mobility: Secondary | ICD-10-CM | POA: Diagnosis not present

## 2016-11-28 DIAGNOSIS — D649 Anemia, unspecified: Secondary | ICD-10-CM | POA: Diagnosis present

## 2016-11-28 DIAGNOSIS — F5101 Primary insomnia: Secondary | ICD-10-CM | POA: Diagnosis not present

## 2016-11-28 DIAGNOSIS — E662 Morbid (severe) obesity with alveolar hypoventilation: Secondary | ICD-10-CM | POA: Diagnosis not present

## 2016-11-28 DIAGNOSIS — R571 Hypovolemic shock: Secondary | ICD-10-CM | POA: Diagnosis not present

## 2016-11-28 DIAGNOSIS — F331 Major depressive disorder, recurrent, moderate: Secondary | ICD-10-CM | POA: Diagnosis not present

## 2016-11-28 DIAGNOSIS — I509 Heart failure, unspecified: Secondary | ICD-10-CM | POA: Diagnosis not present

## 2016-11-28 DIAGNOSIS — K259 Gastric ulcer, unspecified as acute or chronic, without hemorrhage or perforation: Secondary | ICD-10-CM | POA: Diagnosis not present

## 2016-11-28 DIAGNOSIS — H269 Unspecified cataract: Secondary | ICD-10-CM | POA: Diagnosis not present

## 2016-11-28 DIAGNOSIS — J9622 Acute and chronic respiratory failure with hypercapnia: Secondary | ICD-10-CM | POA: Diagnosis present

## 2016-11-28 DIAGNOSIS — I10 Essential (primary) hypertension: Secondary | ICD-10-CM | POA: Diagnosis not present

## 2016-11-28 DIAGNOSIS — I2510 Atherosclerotic heart disease of native coronary artery without angina pectoris: Secondary | ICD-10-CM | POA: Diagnosis not present

## 2016-11-28 DIAGNOSIS — Z87891 Personal history of nicotine dependence: Secondary | ICD-10-CM | POA: Diagnosis not present

## 2016-11-28 DIAGNOSIS — G4733 Obstructive sleep apnea (adult) (pediatric): Secondary | ICD-10-CM | POA: Diagnosis present

## 2016-11-28 DIAGNOSIS — I517 Cardiomegaly: Secondary | ICD-10-CM | POA: Diagnosis not present

## 2016-11-28 DIAGNOSIS — J9621 Acute and chronic respiratory failure with hypoxia: Secondary | ICD-10-CM | POA: Diagnosis not present

## 2016-11-28 DIAGNOSIS — I429 Cardiomyopathy, unspecified: Secondary | ICD-10-CM | POA: Diagnosis not present

## 2016-11-28 DIAGNOSIS — G629 Polyneuropathy, unspecified: Secondary | ICD-10-CM | POA: Diagnosis present

## 2016-11-28 DIAGNOSIS — R0902 Hypoxemia: Secondary | ICD-10-CM | POA: Diagnosis not present

## 2016-11-28 DIAGNOSIS — I481 Persistent atrial fibrillation: Secondary | ICD-10-CM | POA: Diagnosis not present

## 2016-12-07 ENCOUNTER — Telehealth: Payer: Self-pay | Admitting: Pharmacist

## 2016-12-07 NOTE — Telephone Encounter (Signed)
Left message for patient to remind him that INR is past due - he usually checks at home with [facility] system

## 2016-12-13 ENCOUNTER — Ambulatory Visit: Payer: Medicare Other | Admitting: Family Medicine

## 2016-12-14 ENCOUNTER — Encounter: Payer: Self-pay | Admitting: Family Medicine

## 2016-12-14 ENCOUNTER — Telehealth: Payer: Self-pay | Admitting: Family Medicine

## 2016-12-14 NOTE — Telephone Encounter (Signed)
Left detailed message stating he missed appt yesterday and to call back to reschedule his appt.

## 2016-12-18 DIAGNOSIS — M1712 Unilateral primary osteoarthritis, left knee: Secondary | ICD-10-CM | POA: Diagnosis not present

## 2016-12-18 DIAGNOSIS — E039 Hypothyroidism, unspecified: Secondary | ICD-10-CM | POA: Diagnosis not present

## 2016-12-18 DIAGNOSIS — E875 Hyperkalemia: Secondary | ICD-10-CM | POA: Diagnosis not present

## 2016-12-18 DIAGNOSIS — J449 Chronic obstructive pulmonary disease, unspecified: Secondary | ICD-10-CM | POA: Diagnosis not present

## 2016-12-18 DIAGNOSIS — E662 Morbid (severe) obesity with alveolar hypoventilation: Secondary | ICD-10-CM | POA: Diagnosis not present

## 2016-12-18 DIAGNOSIS — R1310 Dysphagia, unspecified: Secondary | ICD-10-CM | POA: Diagnosis not present

## 2016-12-18 DIAGNOSIS — M25511 Pain in right shoulder: Secondary | ICD-10-CM | POA: Diagnosis not present

## 2016-12-18 DIAGNOSIS — Z6841 Body Mass Index (BMI) 40.0 and over, adult: Secondary | ICD-10-CM | POA: Diagnosis not present

## 2016-12-18 DIAGNOSIS — J9611 Chronic respiratory failure with hypoxia: Secondary | ICD-10-CM | POA: Diagnosis not present

## 2016-12-18 DIAGNOSIS — G4733 Obstructive sleep apnea (adult) (pediatric): Secondary | ICD-10-CM | POA: Diagnosis not present

## 2016-12-18 DIAGNOSIS — E559 Vitamin D deficiency, unspecified: Secondary | ICD-10-CM | POA: Diagnosis not present

## 2016-12-18 DIAGNOSIS — F331 Major depressive disorder, recurrent, moderate: Secondary | ICD-10-CM | POA: Diagnosis not present

## 2016-12-18 DIAGNOSIS — E872 Acidosis: Secondary | ICD-10-CM | POA: Diagnosis not present

## 2016-12-18 DIAGNOSIS — I4891 Unspecified atrial fibrillation: Secondary | ICD-10-CM | POA: Diagnosis not present

## 2016-12-18 DIAGNOSIS — G8929 Other chronic pain: Secondary | ICD-10-CM | POA: Diagnosis not present

## 2016-12-18 DIAGNOSIS — Z049 Encounter for examination and observation for unspecified reason: Secondary | ICD-10-CM | POA: Diagnosis not present

## 2016-12-18 DIAGNOSIS — K648 Other hemorrhoids: Secondary | ICD-10-CM | POA: Diagnosis not present

## 2016-12-18 DIAGNOSIS — M1380 Other specified arthritis, unspecified site: Secondary | ICD-10-CM | POA: Diagnosis not present

## 2016-12-18 DIAGNOSIS — I509 Heart failure, unspecified: Secondary | ICD-10-CM | POA: Diagnosis not present

## 2016-12-18 DIAGNOSIS — E870 Hyperosmolality and hypernatremia: Secondary | ICD-10-CM | POA: Diagnosis not present

## 2016-12-18 DIAGNOSIS — R269 Unspecified abnormalities of gait and mobility: Secondary | ICD-10-CM | POA: Diagnosis not present

## 2016-12-18 DIAGNOSIS — I481 Persistent atrial fibrillation: Secondary | ICD-10-CM | POA: Diagnosis not present

## 2016-12-18 DIAGNOSIS — I5042 Chronic combined systolic (congestive) and diastolic (congestive) heart failure: Secondary | ICD-10-CM | POA: Diagnosis not present

## 2016-12-18 DIAGNOSIS — R300 Dysuria: Secondary | ICD-10-CM | POA: Diagnosis not present

## 2016-12-18 DIAGNOSIS — F419 Anxiety disorder, unspecified: Secondary | ICD-10-CM | POA: Diagnosis not present

## 2016-12-18 DIAGNOSIS — I272 Pulmonary hypertension, unspecified: Secondary | ICD-10-CM | POA: Diagnosis not present

## 2016-12-18 DIAGNOSIS — M1711 Unilateral primary osteoarthritis, right knee: Secondary | ICD-10-CM | POA: Diagnosis not present

## 2016-12-18 DIAGNOSIS — J90 Pleural effusion, not elsewhere classified: Secondary | ICD-10-CM | POA: Diagnosis not present

## 2016-12-18 DIAGNOSIS — I429 Cardiomyopathy, unspecified: Secondary | ICD-10-CM | POA: Diagnosis not present

## 2016-12-18 DIAGNOSIS — G609 Hereditary and idiopathic neuropathy, unspecified: Secondary | ICD-10-CM | POA: Diagnosis not present

## 2016-12-18 DIAGNOSIS — K259 Gastric ulcer, unspecified as acute or chronic, without hemorrhage or perforation: Secondary | ICD-10-CM | POA: Diagnosis not present

## 2016-12-18 DIAGNOSIS — R0602 Shortness of breath: Secondary | ICD-10-CM | POA: Diagnosis not present

## 2016-12-18 DIAGNOSIS — K5900 Constipation, unspecified: Secondary | ICD-10-CM | POA: Diagnosis not present

## 2016-12-18 DIAGNOSIS — H269 Unspecified cataract: Secondary | ICD-10-CM | POA: Diagnosis not present

## 2016-12-18 DIAGNOSIS — I482 Chronic atrial fibrillation: Secondary | ICD-10-CM | POA: Diagnosis not present

## 2016-12-18 DIAGNOSIS — I2510 Atherosclerotic heart disease of native coronary artery without angina pectoris: Secondary | ICD-10-CM | POA: Diagnosis not present

## 2016-12-18 DIAGNOSIS — Z7409 Other reduced mobility: Secondary | ICD-10-CM | POA: Diagnosis not present

## 2016-12-18 DIAGNOSIS — N390 Urinary tract infection, site not specified: Secondary | ICD-10-CM | POA: Diagnosis not present

## 2016-12-18 DIAGNOSIS — E785 Hyperlipidemia, unspecified: Secondary | ICD-10-CM | POA: Diagnosis not present

## 2016-12-18 DIAGNOSIS — K219 Gastro-esophageal reflux disease without esophagitis: Secondary | ICD-10-CM | POA: Diagnosis not present

## 2016-12-18 DIAGNOSIS — I5043 Acute on chronic combined systolic (congestive) and diastolic (congestive) heart failure: Secondary | ICD-10-CM | POA: Diagnosis not present

## 2016-12-18 DIAGNOSIS — D513 Other dietary vitamin B12 deficiency anemia: Secondary | ICD-10-CM | POA: Diagnosis not present

## 2016-12-18 DIAGNOSIS — F064 Anxiety disorder due to known physiological condition: Secondary | ICD-10-CM | POA: Diagnosis not present

## 2016-12-18 DIAGNOSIS — R944 Abnormal results of kidney function studies: Secondary | ICD-10-CM | POA: Diagnosis not present

## 2016-12-18 DIAGNOSIS — E876 Hypokalemia: Secondary | ICD-10-CM | POA: Diagnosis not present

## 2016-12-18 DIAGNOSIS — R110 Nausea: Secondary | ICD-10-CM | POA: Diagnosis not present

## 2016-12-18 DIAGNOSIS — I10 Essential (primary) hypertension: Secondary | ICD-10-CM | POA: Diagnosis not present

## 2016-12-18 DIAGNOSIS — Z7901 Long term (current) use of anticoagulants: Secondary | ICD-10-CM | POA: Diagnosis not present

## 2016-12-18 DIAGNOSIS — M25561 Pain in right knee: Secondary | ICD-10-CM | POA: Diagnosis not present

## 2016-12-18 DIAGNOSIS — D649 Anemia, unspecified: Secondary | ICD-10-CM | POA: Diagnosis not present

## 2016-12-18 DIAGNOSIS — F5101 Primary insomnia: Secondary | ICD-10-CM | POA: Diagnosis not present

## 2016-12-18 DIAGNOSIS — I5023 Acute on chronic systolic (congestive) heart failure: Secondary | ICD-10-CM | POA: Diagnosis not present

## 2016-12-18 DIAGNOSIS — M6281 Muscle weakness (generalized): Secondary | ICD-10-CM | POA: Diagnosis not present

## 2016-12-20 DIAGNOSIS — J449 Chronic obstructive pulmonary disease, unspecified: Secondary | ICD-10-CM | POA: Diagnosis not present

## 2016-12-20 DIAGNOSIS — I2510 Atherosclerotic heart disease of native coronary artery without angina pectoris: Secondary | ICD-10-CM | POA: Diagnosis not present

## 2016-12-20 DIAGNOSIS — G8929 Other chronic pain: Secondary | ICD-10-CM | POA: Diagnosis not present

## 2016-12-20 DIAGNOSIS — I10 Essential (primary) hypertension: Secondary | ICD-10-CM | POA: Diagnosis not present

## 2016-12-20 DIAGNOSIS — I5043 Acute on chronic combined systolic (congestive) and diastolic (congestive) heart failure: Secondary | ICD-10-CM | POA: Diagnosis not present

## 2016-12-20 DIAGNOSIS — I481 Persistent atrial fibrillation: Secondary | ICD-10-CM | POA: Diagnosis not present

## 2016-12-20 DIAGNOSIS — M6281 Muscle weakness (generalized): Secondary | ICD-10-CM | POA: Diagnosis not present

## 2016-12-20 DIAGNOSIS — E039 Hypothyroidism, unspecified: Secondary | ICD-10-CM | POA: Diagnosis not present

## 2016-12-20 DIAGNOSIS — E876 Hypokalemia: Secondary | ICD-10-CM | POA: Diagnosis not present

## 2016-12-20 DIAGNOSIS — K219 Gastro-esophageal reflux disease without esophagitis: Secondary | ICD-10-CM | POA: Diagnosis not present

## 2016-12-20 DIAGNOSIS — E785 Hyperlipidemia, unspecified: Secondary | ICD-10-CM | POA: Diagnosis not present

## 2016-12-20 DIAGNOSIS — G609 Hereditary and idiopathic neuropathy, unspecified: Secondary | ICD-10-CM | POA: Diagnosis not present

## 2016-12-21 DIAGNOSIS — E876 Hypokalemia: Secondary | ICD-10-CM | POA: Diagnosis not present

## 2016-12-25 DIAGNOSIS — Z7901 Long term (current) use of anticoagulants: Secondary | ICD-10-CM | POA: Diagnosis not present

## 2016-12-26 DIAGNOSIS — G609 Hereditary and idiopathic neuropathy, unspecified: Secondary | ICD-10-CM | POA: Diagnosis not present

## 2016-12-26 DIAGNOSIS — I10 Essential (primary) hypertension: Secondary | ICD-10-CM | POA: Diagnosis not present

## 2016-12-26 DIAGNOSIS — I481 Persistent atrial fibrillation: Secondary | ICD-10-CM | POA: Diagnosis not present

## 2016-12-26 DIAGNOSIS — E785 Hyperlipidemia, unspecified: Secondary | ICD-10-CM | POA: Diagnosis not present

## 2016-12-26 DIAGNOSIS — I2510 Atherosclerotic heart disease of native coronary artery without angina pectoris: Secondary | ICD-10-CM | POA: Diagnosis not present

## 2016-12-26 DIAGNOSIS — E039 Hypothyroidism, unspecified: Secondary | ICD-10-CM | POA: Diagnosis not present

## 2016-12-26 DIAGNOSIS — K5900 Constipation, unspecified: Secondary | ICD-10-CM | POA: Diagnosis not present

## 2016-12-26 DIAGNOSIS — M6281 Muscle weakness (generalized): Secondary | ICD-10-CM | POA: Diagnosis not present

## 2016-12-26 DIAGNOSIS — K219 Gastro-esophageal reflux disease without esophagitis: Secondary | ICD-10-CM | POA: Diagnosis not present

## 2016-12-26 DIAGNOSIS — G8929 Other chronic pain: Secondary | ICD-10-CM | POA: Diagnosis not present

## 2016-12-26 DIAGNOSIS — I5043 Acute on chronic combined systolic (congestive) and diastolic (congestive) heart failure: Secondary | ICD-10-CM | POA: Diagnosis not present

## 2016-12-26 DIAGNOSIS — J449 Chronic obstructive pulmonary disease, unspecified: Secondary | ICD-10-CM | POA: Diagnosis not present

## 2016-12-27 DIAGNOSIS — Z7901 Long term (current) use of anticoagulants: Secondary | ICD-10-CM | POA: Diagnosis not present

## 2016-12-28 DIAGNOSIS — Z7901 Long term (current) use of anticoagulants: Secondary | ICD-10-CM | POA: Diagnosis not present

## 2016-12-29 DIAGNOSIS — E876 Hypokalemia: Secondary | ICD-10-CM | POA: Diagnosis not present

## 2017-01-01 DIAGNOSIS — Z7901 Long term (current) use of anticoagulants: Secondary | ICD-10-CM | POA: Diagnosis not present

## 2017-01-02 DIAGNOSIS — Z7901 Long term (current) use of anticoagulants: Secondary | ICD-10-CM | POA: Diagnosis not present

## 2017-01-03 DIAGNOSIS — Z7901 Long term (current) use of anticoagulants: Secondary | ICD-10-CM | POA: Diagnosis not present

## 2017-01-03 IMAGING — DX DG CHEST 2V
2 series · 2 of 2 positions shown · non-contrast
Comparison: 06/06/2016

CLINICAL DATA: Follow-up pneumonia

EXAM:
CHEST  2 VIEW

[chest pa]
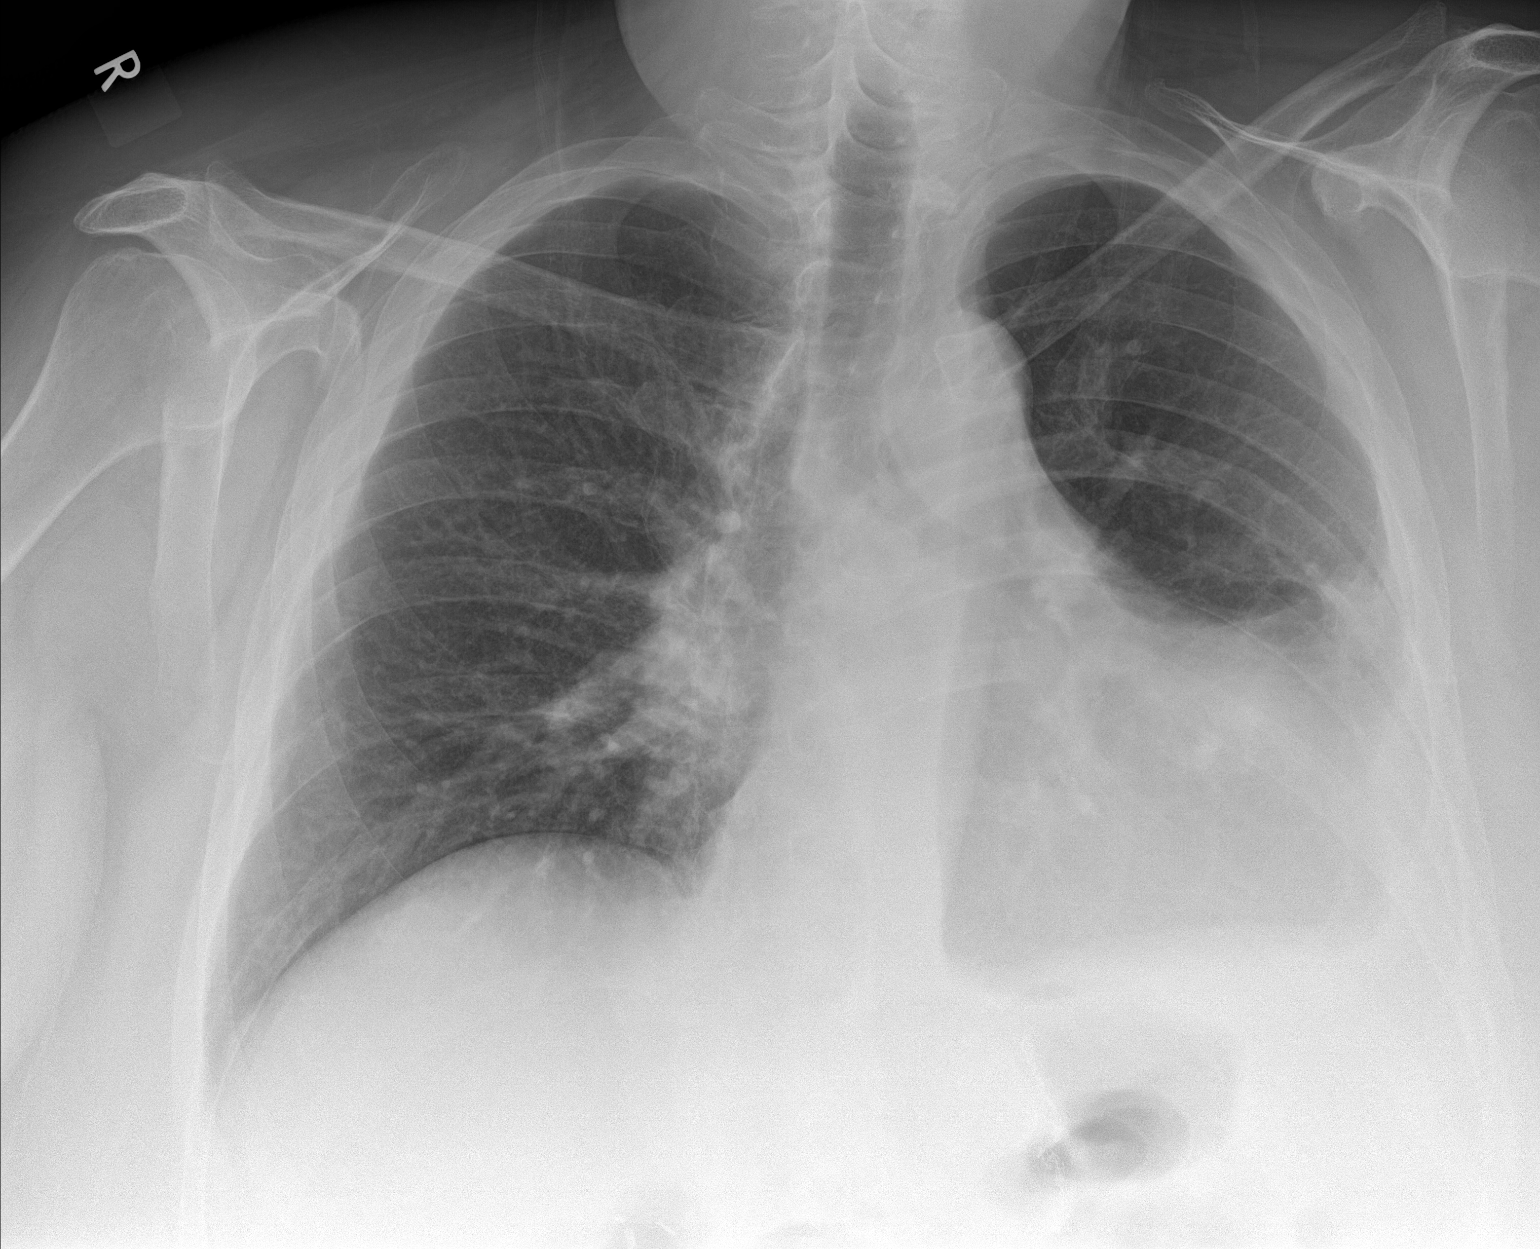

[chest lat]
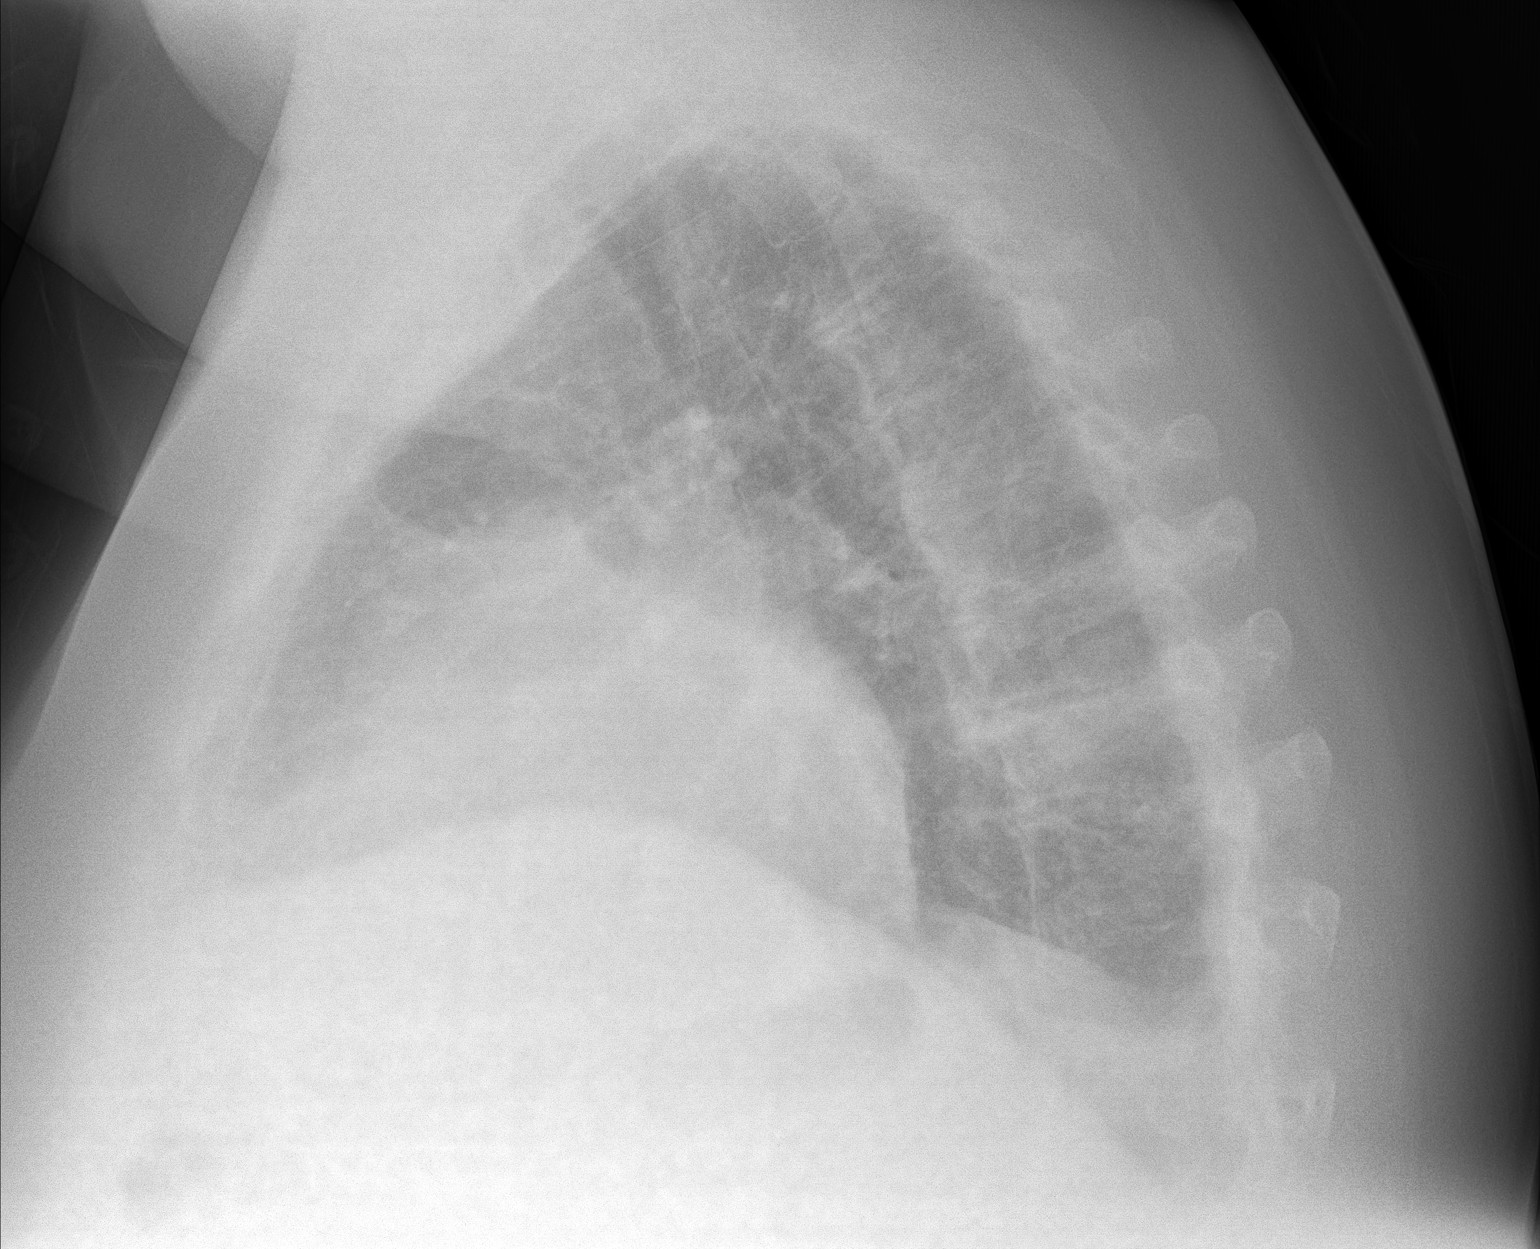

[2 of 2 positions shown; findings below may reference images not displayed]

FINDINGS: Cardiomediastinal silhouette is stable. Again noted small left
pleural effusion with left basilar atelectasis or scarring.
Persistent atelectasis infiltrate or infiltrative process in
lingula. Further correlation with CT scan of the chest with IV
contrast is recommended. Right lung is clear. No pulmonary edema.
IMPRESSION: Again noted small left pleural effusion with left basilar
atelectasis or scarring. Persistent atelectasis infiltrate or
infiltrative process in lingula. Further correlation with CT scan of
the chest with IV contrast is recommended.

## 2017-01-04 DIAGNOSIS — Z7901 Long term (current) use of anticoagulants: Secondary | ICD-10-CM | POA: Diagnosis not present

## 2017-01-05 ENCOUNTER — Telehealth: Payer: Self-pay | Admitting: Pharmacist

## 2017-01-05 DIAGNOSIS — Z7901 Long term (current) use of anticoagulants: Secondary | ICD-10-CM | POA: Diagnosis not present

## 2017-01-05 NOTE — Telephone Encounter (Signed)
Patient had long hospitalization with decompensation.  He is in rehab in [facility] current though patient states that he is going to stay in current facility for awhile.  He will contact us when he is released home.

## 2017-01-10 DIAGNOSIS — R300 Dysuria: Secondary | ICD-10-CM | POA: Diagnosis not present

## 2017-01-12 DIAGNOSIS — Z7901 Long term (current) use of anticoagulants: Secondary | ICD-10-CM | POA: Diagnosis not present

## 2017-01-15 DIAGNOSIS — Z7901 Long term (current) use of anticoagulants: Secondary | ICD-10-CM | POA: Diagnosis not present

## 2017-01-16 DIAGNOSIS — I10 Essential (primary) hypertension: Secondary | ICD-10-CM | POA: Diagnosis not present

## 2017-01-16 DIAGNOSIS — I5043 Acute on chronic combined systolic (congestive) and diastolic (congestive) heart failure: Secondary | ICD-10-CM | POA: Diagnosis not present

## 2017-01-16 DIAGNOSIS — R944 Abnormal results of kidney function studies: Secondary | ICD-10-CM | POA: Diagnosis not present

## 2017-01-18 DIAGNOSIS — Z7901 Long term (current) use of anticoagulants: Secondary | ICD-10-CM | POA: Diagnosis not present

## 2017-01-25 DIAGNOSIS — E876 Hypokalemia: Secondary | ICD-10-CM | POA: Diagnosis not present

## 2017-01-25 DIAGNOSIS — Z7901 Long term (current) use of anticoagulants: Secondary | ICD-10-CM | POA: Diagnosis not present

## 2017-01-30 DIAGNOSIS — E872 Acidosis: Secondary | ICD-10-CM | POA: Diagnosis not present

## 2017-01-30 DIAGNOSIS — E876 Hypokalemia: Secondary | ICD-10-CM | POA: Diagnosis not present

## 2017-01-30 DIAGNOSIS — R300 Dysuria: Secondary | ICD-10-CM | POA: Diagnosis not present

## 2017-01-30 DIAGNOSIS — Z7901 Long term (current) use of anticoagulants: Secondary | ICD-10-CM | POA: Diagnosis not present

## 2017-02-06 DIAGNOSIS — Z7901 Long term (current) use of anticoagulants: Secondary | ICD-10-CM | POA: Diagnosis not present

## 2017-02-12 DIAGNOSIS — F331 Major depressive disorder, recurrent, moderate: Secondary | ICD-10-CM | POA: Diagnosis not present

## 2017-02-12 DIAGNOSIS — F5101 Primary insomnia: Secondary | ICD-10-CM | POA: Diagnosis not present

## 2017-02-12 DIAGNOSIS — F064 Anxiety disorder due to known physiological condition: Secondary | ICD-10-CM | POA: Diagnosis not present

## 2017-02-13 DIAGNOSIS — I10 Essential (primary) hypertension: Secondary | ICD-10-CM | POA: Diagnosis not present

## 2017-02-13 DIAGNOSIS — I2510 Atherosclerotic heart disease of native coronary artery without angina pectoris: Secondary | ICD-10-CM | POA: Diagnosis not present

## 2017-02-13 DIAGNOSIS — E039 Hypothyroidism, unspecified: Secondary | ICD-10-CM | POA: Diagnosis not present

## 2017-02-13 DIAGNOSIS — R110 Nausea: Secondary | ICD-10-CM | POA: Diagnosis not present

## 2017-02-13 DIAGNOSIS — I481 Persistent atrial fibrillation: Secondary | ICD-10-CM | POA: Diagnosis not present

## 2017-02-13 DIAGNOSIS — I5042 Chronic combined systolic (congestive) and diastolic (congestive) heart failure: Secondary | ICD-10-CM | POA: Diagnosis not present

## 2017-02-13 DIAGNOSIS — G8929 Other chronic pain: Secondary | ICD-10-CM | POA: Diagnosis not present

## 2017-02-13 DIAGNOSIS — G609 Hereditary and idiopathic neuropathy, unspecified: Secondary | ICD-10-CM | POA: Diagnosis not present

## 2017-02-13 DIAGNOSIS — J449 Chronic obstructive pulmonary disease, unspecified: Secondary | ICD-10-CM | POA: Diagnosis not present

## 2017-02-13 DIAGNOSIS — K219 Gastro-esophageal reflux disease without esophagitis: Secondary | ICD-10-CM | POA: Diagnosis not present

## 2017-02-13 DIAGNOSIS — E785 Hyperlipidemia, unspecified: Secondary | ICD-10-CM | POA: Diagnosis not present

## 2017-02-13 DIAGNOSIS — M6281 Muscle weakness (generalized): Secondary | ICD-10-CM | POA: Diagnosis not present

## 2017-02-22 DIAGNOSIS — K5900 Constipation, unspecified: Secondary | ICD-10-CM | POA: Diagnosis not present

## 2017-02-22 DIAGNOSIS — K648 Other hemorrhoids: Secondary | ICD-10-CM | POA: Diagnosis not present

## 2017-02-27 DIAGNOSIS — I10 Essential (primary) hypertension: Secondary | ICD-10-CM | POA: Diagnosis not present

## 2017-02-27 DIAGNOSIS — E876 Hypokalemia: Secondary | ICD-10-CM | POA: Diagnosis not present

## 2017-02-27 DIAGNOSIS — E039 Hypothyroidism, unspecified: Secondary | ICD-10-CM | POA: Diagnosis not present

## 2017-02-27 DIAGNOSIS — M6281 Muscle weakness (generalized): Secondary | ICD-10-CM | POA: Diagnosis not present

## 2017-02-27 DIAGNOSIS — R110 Nausea: Secondary | ICD-10-CM | POA: Diagnosis not present

## 2017-02-27 DIAGNOSIS — I481 Persistent atrial fibrillation: Secondary | ICD-10-CM | POA: Diagnosis not present

## 2017-02-27 DIAGNOSIS — I5042 Chronic combined systolic (congestive) and diastolic (congestive) heart failure: Secondary | ICD-10-CM | POA: Diagnosis not present

## 2017-02-27 DIAGNOSIS — G609 Hereditary and idiopathic neuropathy, unspecified: Secondary | ICD-10-CM | POA: Diagnosis not present

## 2017-02-27 DIAGNOSIS — E785 Hyperlipidemia, unspecified: Secondary | ICD-10-CM | POA: Diagnosis not present

## 2017-02-27 DIAGNOSIS — K5900 Constipation, unspecified: Secondary | ICD-10-CM | POA: Diagnosis not present

## 2017-02-27 DIAGNOSIS — I2510 Atherosclerotic heart disease of native coronary artery without angina pectoris: Secondary | ICD-10-CM | POA: Diagnosis not present

## 2017-02-27 DIAGNOSIS — J449 Chronic obstructive pulmonary disease, unspecified: Secondary | ICD-10-CM | POA: Diagnosis not present

## 2017-03-01 DIAGNOSIS — Z7901 Long term (current) use of anticoagulants: Secondary | ICD-10-CM | POA: Diagnosis not present

## 2017-03-12 DIAGNOSIS — Z7901 Long term (current) use of anticoagulants: Secondary | ICD-10-CM | POA: Diagnosis not present

## 2017-03-16 DIAGNOSIS — M1711 Unilateral primary osteoarthritis, right knee: Secondary | ICD-10-CM | POA: Diagnosis not present

## 2017-03-16 DIAGNOSIS — M25561 Pain in right knee: Secondary | ICD-10-CM | POA: Diagnosis not present

## 2017-03-16 DIAGNOSIS — M1712 Unilateral primary osteoarthritis, left knee: Secondary | ICD-10-CM | POA: Diagnosis not present

## 2017-03-16 DIAGNOSIS — M25511 Pain in right shoulder: Secondary | ICD-10-CM | POA: Diagnosis not present

## 2017-03-26 DIAGNOSIS — F064 Anxiety disorder due to known physiological condition: Secondary | ICD-10-CM | POA: Diagnosis not present

## 2017-03-26 DIAGNOSIS — F331 Major depressive disorder, recurrent, moderate: Secondary | ICD-10-CM | POA: Diagnosis not present

## 2017-03-26 DIAGNOSIS — F5101 Primary insomnia: Secondary | ICD-10-CM | POA: Diagnosis not present

## 2017-03-28 DIAGNOSIS — I481 Persistent atrial fibrillation: Secondary | ICD-10-CM | POA: Diagnosis not present

## 2017-03-28 DIAGNOSIS — D513 Other dietary vitamin B12 deficiency anemia: Secondary | ICD-10-CM | POA: Diagnosis not present

## 2017-03-28 DIAGNOSIS — F419 Anxiety disorder, unspecified: Secondary | ICD-10-CM | POA: Diagnosis not present

## 2017-03-28 DIAGNOSIS — G8929 Other chronic pain: Secondary | ICD-10-CM | POA: Diagnosis not present

## 2017-03-28 DIAGNOSIS — G609 Hereditary and idiopathic neuropathy, unspecified: Secondary | ICD-10-CM | POA: Diagnosis not present

## 2017-03-28 DIAGNOSIS — I5043 Acute on chronic combined systolic (congestive) and diastolic (congestive) heart failure: Secondary | ICD-10-CM | POA: Diagnosis not present

## 2017-03-28 DIAGNOSIS — R269 Unspecified abnormalities of gait and mobility: Secondary | ICD-10-CM | POA: Diagnosis not present

## 2017-03-28 DIAGNOSIS — R110 Nausea: Secondary | ICD-10-CM | POA: Diagnosis not present

## 2017-03-28 DIAGNOSIS — M6281 Muscle weakness (generalized): Secondary | ICD-10-CM | POA: Diagnosis not present

## 2017-03-28 DIAGNOSIS — H269 Unspecified cataract: Secondary | ICD-10-CM | POA: Diagnosis not present

## 2017-03-28 DIAGNOSIS — J449 Chronic obstructive pulmonary disease, unspecified: Secondary | ICD-10-CM | POA: Diagnosis not present

## 2017-03-28 DIAGNOSIS — G4733 Obstructive sleep apnea (adult) (pediatric): Secondary | ICD-10-CM | POA: Diagnosis not present

## 2017-03-28 DIAGNOSIS — F5101 Primary insomnia: Secondary | ICD-10-CM | POA: Diagnosis not present

## 2017-03-28 DIAGNOSIS — I5042 Chronic combined systolic (congestive) and diastolic (congestive) heart failure: Secondary | ICD-10-CM | POA: Diagnosis not present

## 2017-03-28 DIAGNOSIS — E870 Hyperosmolality and hypernatremia: Secondary | ICD-10-CM | POA: Diagnosis not present

## 2017-03-28 DIAGNOSIS — E559 Vitamin D deficiency, unspecified: Secondary | ICD-10-CM | POA: Diagnosis not present

## 2017-03-28 DIAGNOSIS — K219 Gastro-esophageal reflux disease without esophagitis: Secondary | ICD-10-CM | POA: Diagnosis not present

## 2017-03-28 DIAGNOSIS — J9611 Chronic respiratory failure with hypoxia: Secondary | ICD-10-CM | POA: Diagnosis not present

## 2017-03-28 DIAGNOSIS — I2510 Atherosclerotic heart disease of native coronary artery without angina pectoris: Secondary | ICD-10-CM | POA: Diagnosis not present

## 2017-03-28 DIAGNOSIS — Z6841 Body Mass Index (BMI) 40.0 and over, adult: Secondary | ICD-10-CM | POA: Diagnosis not present

## 2017-03-28 DIAGNOSIS — M1380 Other specified arthritis, unspecified site: Secondary | ICD-10-CM | POA: Diagnosis not present

## 2017-03-28 DIAGNOSIS — F331 Major depressive disorder, recurrent, moderate: Secondary | ICD-10-CM | POA: Diagnosis not present

## 2017-03-28 DIAGNOSIS — E876 Hypokalemia: Secondary | ICD-10-CM | POA: Diagnosis not present

## 2017-03-28 DIAGNOSIS — D649 Anemia, unspecified: Secondary | ICD-10-CM | POA: Diagnosis not present

## 2017-03-28 DIAGNOSIS — K5900 Constipation, unspecified: Secondary | ICD-10-CM | POA: Diagnosis not present

## 2017-03-28 DIAGNOSIS — I4891 Unspecified atrial fibrillation: Secondary | ICD-10-CM | POA: Diagnosis not present

## 2017-03-28 DIAGNOSIS — I272 Pulmonary hypertension, unspecified: Secondary | ICD-10-CM | POA: Diagnosis not present

## 2017-03-28 DIAGNOSIS — E039 Hypothyroidism, unspecified: Secondary | ICD-10-CM | POA: Diagnosis not present

## 2017-03-28 DIAGNOSIS — I10 Essential (primary) hypertension: Secondary | ICD-10-CM | POA: Diagnosis not present

## 2017-03-28 DIAGNOSIS — K259 Gastric ulcer, unspecified as acute or chronic, without hemorrhage or perforation: Secondary | ICD-10-CM | POA: Diagnosis not present

## 2017-03-28 DIAGNOSIS — E785 Hyperlipidemia, unspecified: Secondary | ICD-10-CM | POA: Diagnosis not present

## 2017-04-06 DIAGNOSIS — I4891 Unspecified atrial fibrillation: Secondary | ICD-10-CM | POA: Diagnosis not present

## 2017-04-06 DIAGNOSIS — G4733 Obstructive sleep apnea (adult) (pediatric): Secondary | ICD-10-CM | POA: Diagnosis not present

## 2017-04-06 DIAGNOSIS — I5033 Acute on chronic diastolic (congestive) heart failure: Secondary | ICD-10-CM | POA: Diagnosis not present

## 2017-04-06 DIAGNOSIS — I272 Pulmonary hypertension, unspecified: Secondary | ICD-10-CM | POA: Diagnosis not present

## 2017-04-06 DIAGNOSIS — Z6841 Body Mass Index (BMI) 40.0 and over, adult: Secondary | ICD-10-CM | POA: Diagnosis not present

## 2017-04-06 DIAGNOSIS — I5042 Chronic combined systolic (congestive) and diastolic (congestive) heart failure: Secondary | ICD-10-CM | POA: Diagnosis not present

## 2017-04-06 DIAGNOSIS — J9611 Chronic respiratory failure with hypoxia: Secondary | ICD-10-CM | POA: Diagnosis not present

## 2017-04-06 DIAGNOSIS — I472 Ventricular tachycardia: Secondary | ICD-10-CM | POA: Diagnosis not present

## 2017-04-06 DIAGNOSIS — R0602 Shortness of breath: Secondary | ICD-10-CM | POA: Diagnosis not present

## 2017-04-06 DIAGNOSIS — I10 Essential (primary) hypertension: Secondary | ICD-10-CM | POA: Diagnosis not present

## 2017-04-06 DIAGNOSIS — I2699 Other pulmonary embolism without acute cor pulmonale: Secondary | ICD-10-CM | POA: Diagnosis not present

## 2017-04-09 DIAGNOSIS — I10 Essential (primary) hypertension: Secondary | ICD-10-CM | POA: Diagnosis not present

## 2017-04-19 DIAGNOSIS — G894 Chronic pain syndrome: Secondary | ICD-10-CM | POA: Diagnosis not present

## 2017-05-07 DIAGNOSIS — F5101 Primary insomnia: Secondary | ICD-10-CM | POA: Diagnosis not present

## 2017-05-07 DIAGNOSIS — F064 Anxiety disorder due to known physiological condition: Secondary | ICD-10-CM | POA: Diagnosis not present

## 2017-05-07 DIAGNOSIS — F331 Major depressive disorder, recurrent, moderate: Secondary | ICD-10-CM | POA: Diagnosis not present

## 2017-05-16 DIAGNOSIS — E876 Hypokalemia: Secondary | ICD-10-CM | POA: Diagnosis not present

## 2017-05-16 DIAGNOSIS — Z6841 Body Mass Index (BMI) 40.0 and over, adult: Secondary | ICD-10-CM | POA: Diagnosis not present

## 2017-05-16 DIAGNOSIS — G609 Hereditary and idiopathic neuropathy, unspecified: Secondary | ICD-10-CM | POA: Diagnosis not present

## 2017-05-16 DIAGNOSIS — G8929 Other chronic pain: Secondary | ICD-10-CM | POA: Diagnosis not present

## 2017-05-16 DIAGNOSIS — E785 Hyperlipidemia, unspecified: Secondary | ICD-10-CM | POA: Diagnosis not present

## 2017-05-16 DIAGNOSIS — I2510 Atherosclerotic heart disease of native coronary artery without angina pectoris: Secondary | ICD-10-CM | POA: Diagnosis not present

## 2017-05-16 DIAGNOSIS — K219 Gastro-esophageal reflux disease without esophagitis: Secondary | ICD-10-CM | POA: Diagnosis not present

## 2017-05-16 DIAGNOSIS — H269 Unspecified cataract: Secondary | ICD-10-CM | POA: Diagnosis not present

## 2017-05-16 DIAGNOSIS — E870 Hyperosmolality and hypernatremia: Secondary | ICD-10-CM | POA: Diagnosis not present

## 2017-05-16 DIAGNOSIS — G4733 Obstructive sleep apnea (adult) (pediatric): Secondary | ICD-10-CM | POA: Diagnosis not present

## 2017-05-16 DIAGNOSIS — I5043 Acute on chronic combined systolic (congestive) and diastolic (congestive) heart failure: Secondary | ICD-10-CM | POA: Diagnosis not present

## 2017-05-16 DIAGNOSIS — I10 Essential (primary) hypertension: Secondary | ICD-10-CM | POA: Diagnosis not present

## 2017-05-16 DIAGNOSIS — M6281 Muscle weakness (generalized): Secondary | ICD-10-CM | POA: Diagnosis not present

## 2017-05-16 DIAGNOSIS — R278 Other lack of coordination: Secondary | ICD-10-CM | POA: Diagnosis not present

## 2017-05-16 DIAGNOSIS — I272 Pulmonary hypertension, unspecified: Secondary | ICD-10-CM | POA: Diagnosis not present

## 2017-05-16 DIAGNOSIS — J9611 Chronic respiratory failure with hypoxia: Secondary | ICD-10-CM | POA: Diagnosis not present

## 2017-05-16 DIAGNOSIS — K5900 Constipation, unspecified: Secondary | ICD-10-CM | POA: Diagnosis not present

## 2017-05-16 DIAGNOSIS — E039 Hypothyroidism, unspecified: Secondary | ICD-10-CM | POA: Diagnosis not present

## 2017-05-16 DIAGNOSIS — J449 Chronic obstructive pulmonary disease, unspecified: Secondary | ICD-10-CM | POA: Diagnosis not present

## 2017-05-16 DIAGNOSIS — I4891 Unspecified atrial fibrillation: Secondary | ICD-10-CM | POA: Diagnosis not present

## 2017-05-17 DIAGNOSIS — M6281 Muscle weakness (generalized): Secondary | ICD-10-CM | POA: Diagnosis not present

## 2017-05-17 DIAGNOSIS — I5043 Acute on chronic combined systolic (congestive) and diastolic (congestive) heart failure: Secondary | ICD-10-CM | POA: Diagnosis not present

## 2017-05-17 DIAGNOSIS — R278 Other lack of coordination: Secondary | ICD-10-CM | POA: Diagnosis not present

## 2017-05-17 DIAGNOSIS — J9611 Chronic respiratory failure with hypoxia: Secondary | ICD-10-CM | POA: Diagnosis not present

## 2017-05-17 DIAGNOSIS — J449 Chronic obstructive pulmonary disease, unspecified: Secondary | ICD-10-CM | POA: Diagnosis not present

## 2017-05-18 DIAGNOSIS — M6281 Muscle weakness (generalized): Secondary | ICD-10-CM | POA: Diagnosis not present

## 2017-05-18 DIAGNOSIS — I5043 Acute on chronic combined systolic (congestive) and diastolic (congestive) heart failure: Secondary | ICD-10-CM | POA: Diagnosis not present

## 2017-05-18 DIAGNOSIS — J449 Chronic obstructive pulmonary disease, unspecified: Secondary | ICD-10-CM | POA: Diagnosis not present

## 2017-05-18 DIAGNOSIS — R278 Other lack of coordination: Secondary | ICD-10-CM | POA: Diagnosis not present

## 2017-05-18 DIAGNOSIS — J9611 Chronic respiratory failure with hypoxia: Secondary | ICD-10-CM | POA: Diagnosis not present

## 2017-05-21 DIAGNOSIS — I5043 Acute on chronic combined systolic (congestive) and diastolic (congestive) heart failure: Secondary | ICD-10-CM | POA: Diagnosis not present

## 2017-05-21 DIAGNOSIS — R278 Other lack of coordination: Secondary | ICD-10-CM | POA: Diagnosis not present

## 2017-05-21 DIAGNOSIS — J9611 Chronic respiratory failure with hypoxia: Secondary | ICD-10-CM | POA: Diagnosis not present

## 2017-05-21 DIAGNOSIS — J449 Chronic obstructive pulmonary disease, unspecified: Secondary | ICD-10-CM | POA: Diagnosis not present

## 2017-05-21 DIAGNOSIS — M6281 Muscle weakness (generalized): Secondary | ICD-10-CM | POA: Diagnosis not present

## 2017-05-22 DIAGNOSIS — R278 Other lack of coordination: Secondary | ICD-10-CM | POA: Diagnosis not present

## 2017-05-22 DIAGNOSIS — E785 Hyperlipidemia, unspecified: Secondary | ICD-10-CM | POA: Diagnosis not present

## 2017-05-22 DIAGNOSIS — M6281 Muscle weakness (generalized): Secondary | ICD-10-CM | POA: Diagnosis not present

## 2017-05-22 DIAGNOSIS — J449 Chronic obstructive pulmonary disease, unspecified: Secondary | ICD-10-CM | POA: Diagnosis not present

## 2017-05-22 DIAGNOSIS — J9611 Chronic respiratory failure with hypoxia: Secondary | ICD-10-CM | POA: Diagnosis not present

## 2017-05-22 DIAGNOSIS — K219 Gastro-esophageal reflux disease without esophagitis: Secondary | ICD-10-CM | POA: Diagnosis not present

## 2017-05-22 DIAGNOSIS — I5043 Acute on chronic combined systolic (congestive) and diastolic (congestive) heart failure: Secondary | ICD-10-CM | POA: Diagnosis not present

## 2017-05-22 DIAGNOSIS — I10 Essential (primary) hypertension: Secondary | ICD-10-CM | POA: Diagnosis not present

## 2017-05-22 DIAGNOSIS — K5900 Constipation, unspecified: Secondary | ICD-10-CM | POA: Diagnosis not present

## 2017-05-22 DIAGNOSIS — I5042 Chronic combined systolic (congestive) and diastolic (congestive) heart failure: Secondary | ICD-10-CM | POA: Diagnosis not present

## 2017-05-22 DIAGNOSIS — E039 Hypothyroidism, unspecified: Secondary | ICD-10-CM | POA: Diagnosis not present

## 2017-05-22 DIAGNOSIS — D649 Anemia, unspecified: Secondary | ICD-10-CM | POA: Diagnosis not present

## 2017-05-22 DIAGNOSIS — I481 Persistent atrial fibrillation: Secondary | ICD-10-CM | POA: Diagnosis not present

## 2017-05-22 DIAGNOSIS — G609 Hereditary and idiopathic neuropathy, unspecified: Secondary | ICD-10-CM | POA: Diagnosis not present

## 2017-05-22 DIAGNOSIS — I2510 Atherosclerotic heart disease of native coronary artery without angina pectoris: Secondary | ICD-10-CM | POA: Diagnosis not present

## 2017-05-23 DIAGNOSIS — R278 Other lack of coordination: Secondary | ICD-10-CM | POA: Diagnosis not present

## 2017-05-23 DIAGNOSIS — J449 Chronic obstructive pulmonary disease, unspecified: Secondary | ICD-10-CM | POA: Diagnosis not present

## 2017-05-23 DIAGNOSIS — M6281 Muscle weakness (generalized): Secondary | ICD-10-CM | POA: Diagnosis not present

## 2017-05-23 DIAGNOSIS — I5043 Acute on chronic combined systolic (congestive) and diastolic (congestive) heart failure: Secondary | ICD-10-CM | POA: Diagnosis not present

## 2017-05-23 DIAGNOSIS — J9611 Chronic respiratory failure with hypoxia: Secondary | ICD-10-CM | POA: Diagnosis not present

## 2017-05-24 DIAGNOSIS — I10 Essential (primary) hypertension: Secondary | ICD-10-CM | POA: Diagnosis not present

## 2017-05-24 DIAGNOSIS — I5043 Acute on chronic combined systolic (congestive) and diastolic (congestive) heart failure: Secondary | ICD-10-CM | POA: Diagnosis not present

## 2017-05-24 DIAGNOSIS — R278 Other lack of coordination: Secondary | ICD-10-CM | POA: Diagnosis not present

## 2017-05-24 DIAGNOSIS — M6281 Muscle weakness (generalized): Secondary | ICD-10-CM | POA: Diagnosis not present

## 2017-05-24 DIAGNOSIS — R791 Abnormal coagulation profile: Secondary | ICD-10-CM | POA: Diagnosis not present

## 2017-05-24 DIAGNOSIS — J9611 Chronic respiratory failure with hypoxia: Secondary | ICD-10-CM | POA: Diagnosis not present

## 2017-05-24 DIAGNOSIS — J449 Chronic obstructive pulmonary disease, unspecified: Secondary | ICD-10-CM | POA: Diagnosis not present

## 2017-05-25 DIAGNOSIS — I5043 Acute on chronic combined systolic (congestive) and diastolic (congestive) heart failure: Secondary | ICD-10-CM | POA: Diagnosis not present

## 2017-05-25 DIAGNOSIS — R278 Other lack of coordination: Secondary | ICD-10-CM | POA: Diagnosis not present

## 2017-05-25 DIAGNOSIS — M6281 Muscle weakness (generalized): Secondary | ICD-10-CM | POA: Diagnosis not present

## 2017-05-25 DIAGNOSIS — J449 Chronic obstructive pulmonary disease, unspecified: Secondary | ICD-10-CM | POA: Diagnosis not present

## 2017-05-25 DIAGNOSIS — J9611 Chronic respiratory failure with hypoxia: Secondary | ICD-10-CM | POA: Diagnosis not present

## 2017-05-28 DIAGNOSIS — R278 Other lack of coordination: Secondary | ICD-10-CM | POA: Diagnosis not present

## 2017-05-28 DIAGNOSIS — I5043 Acute on chronic combined systolic (congestive) and diastolic (congestive) heart failure: Secondary | ICD-10-CM | POA: Diagnosis not present

## 2017-05-28 DIAGNOSIS — M6281 Muscle weakness (generalized): Secondary | ICD-10-CM | POA: Diagnosis not present

## 2017-05-28 DIAGNOSIS — J449 Chronic obstructive pulmonary disease, unspecified: Secondary | ICD-10-CM | POA: Diagnosis not present

## 2017-05-28 DIAGNOSIS — J9611 Chronic respiratory failure with hypoxia: Secondary | ICD-10-CM | POA: Diagnosis not present

## 2017-05-29 DIAGNOSIS — I5043 Acute on chronic combined systolic (congestive) and diastolic (congestive) heart failure: Secondary | ICD-10-CM | POA: Diagnosis not present

## 2017-05-29 DIAGNOSIS — M6281 Muscle weakness (generalized): Secondary | ICD-10-CM | POA: Diagnosis not present

## 2017-05-29 DIAGNOSIS — J9611 Chronic respiratory failure with hypoxia: Secondary | ICD-10-CM | POA: Diagnosis not present

## 2017-05-29 DIAGNOSIS — J449 Chronic obstructive pulmonary disease, unspecified: Secondary | ICD-10-CM | POA: Diagnosis not present

## 2017-05-29 DIAGNOSIS — R278 Other lack of coordination: Secondary | ICD-10-CM | POA: Diagnosis not present

## 2017-05-31 DIAGNOSIS — J9611 Chronic respiratory failure with hypoxia: Secondary | ICD-10-CM | POA: Diagnosis not present

## 2017-05-31 DIAGNOSIS — I5043 Acute on chronic combined systolic (congestive) and diastolic (congestive) heart failure: Secondary | ICD-10-CM | POA: Diagnosis not present

## 2017-05-31 DIAGNOSIS — J449 Chronic obstructive pulmonary disease, unspecified: Secondary | ICD-10-CM | POA: Diagnosis not present

## 2017-05-31 DIAGNOSIS — R278 Other lack of coordination: Secondary | ICD-10-CM | POA: Diagnosis not present

## 2017-05-31 DIAGNOSIS — M6281 Muscle weakness (generalized): Secondary | ICD-10-CM | POA: Diagnosis not present

## 2017-06-01 DIAGNOSIS — M6281 Muscle weakness (generalized): Secondary | ICD-10-CM | POA: Diagnosis not present

## 2017-06-01 DIAGNOSIS — I5043 Acute on chronic combined systolic (congestive) and diastolic (congestive) heart failure: Secondary | ICD-10-CM | POA: Diagnosis not present

## 2017-06-01 DIAGNOSIS — J449 Chronic obstructive pulmonary disease, unspecified: Secondary | ICD-10-CM | POA: Diagnosis not present

## 2017-06-01 DIAGNOSIS — J9611 Chronic respiratory failure with hypoxia: Secondary | ICD-10-CM | POA: Diagnosis not present

## 2017-06-01 DIAGNOSIS — R278 Other lack of coordination: Secondary | ICD-10-CM | POA: Diagnosis not present

## 2017-06-04 DIAGNOSIS — I5043 Acute on chronic combined systolic (congestive) and diastolic (congestive) heart failure: Secondary | ICD-10-CM | POA: Diagnosis not present

## 2017-06-04 DIAGNOSIS — J9611 Chronic respiratory failure with hypoxia: Secondary | ICD-10-CM | POA: Diagnosis not present

## 2017-06-04 DIAGNOSIS — M6281 Muscle weakness (generalized): Secondary | ICD-10-CM | POA: Diagnosis not present

## 2017-06-04 DIAGNOSIS — J449 Chronic obstructive pulmonary disease, unspecified: Secondary | ICD-10-CM | POA: Diagnosis not present

## 2017-06-04 DIAGNOSIS — R278 Other lack of coordination: Secondary | ICD-10-CM | POA: Diagnosis not present

## 2017-06-05 DIAGNOSIS — M6281 Muscle weakness (generalized): Secondary | ICD-10-CM | POA: Diagnosis not present

## 2017-06-05 DIAGNOSIS — J449 Chronic obstructive pulmonary disease, unspecified: Secondary | ICD-10-CM | POA: Diagnosis not present

## 2017-06-05 DIAGNOSIS — R278 Other lack of coordination: Secondary | ICD-10-CM | POA: Diagnosis not present

## 2017-06-05 DIAGNOSIS — J9611 Chronic respiratory failure with hypoxia: Secondary | ICD-10-CM | POA: Diagnosis not present

## 2017-06-05 DIAGNOSIS — I5043 Acute on chronic combined systolic (congestive) and diastolic (congestive) heart failure: Secondary | ICD-10-CM | POA: Diagnosis not present

## 2017-06-06 DIAGNOSIS — R278 Other lack of coordination: Secondary | ICD-10-CM | POA: Diagnosis not present

## 2017-06-06 DIAGNOSIS — E870 Hyperosmolality and hypernatremia: Secondary | ICD-10-CM | POA: Diagnosis not present

## 2017-06-06 DIAGNOSIS — E876 Hypokalemia: Secondary | ICD-10-CM | POA: Diagnosis not present

## 2017-06-06 DIAGNOSIS — I2510 Atherosclerotic heart disease of native coronary artery without angina pectoris: Secondary | ICD-10-CM | POA: Diagnosis not present

## 2017-06-06 DIAGNOSIS — K219 Gastro-esophageal reflux disease without esophagitis: Secondary | ICD-10-CM | POA: Diagnosis not present

## 2017-06-06 DIAGNOSIS — E039 Hypothyroidism, unspecified: Secondary | ICD-10-CM | POA: Diagnosis not present

## 2017-06-06 DIAGNOSIS — G609 Hereditary and idiopathic neuropathy, unspecified: Secondary | ICD-10-CM | POA: Diagnosis not present

## 2017-06-06 DIAGNOSIS — K5900 Constipation, unspecified: Secondary | ICD-10-CM | POA: Diagnosis not present

## 2017-06-06 DIAGNOSIS — I5043 Acute on chronic combined systolic (congestive) and diastolic (congestive) heart failure: Secondary | ICD-10-CM | POA: Diagnosis not present

## 2017-06-06 DIAGNOSIS — I4891 Unspecified atrial fibrillation: Secondary | ICD-10-CM | POA: Diagnosis not present

## 2017-06-06 DIAGNOSIS — I10 Essential (primary) hypertension: Secondary | ICD-10-CM | POA: Diagnosis not present

## 2017-06-06 DIAGNOSIS — E785 Hyperlipidemia, unspecified: Secondary | ICD-10-CM | POA: Diagnosis not present

## 2017-06-06 DIAGNOSIS — G4733 Obstructive sleep apnea (adult) (pediatric): Secondary | ICD-10-CM | POA: Diagnosis not present

## 2017-06-06 DIAGNOSIS — J9611 Chronic respiratory failure with hypoxia: Secondary | ICD-10-CM | POA: Diagnosis not present

## 2017-06-06 DIAGNOSIS — G8929 Other chronic pain: Secondary | ICD-10-CM | POA: Diagnosis not present

## 2017-06-06 DIAGNOSIS — Z6841 Body Mass Index (BMI) 40.0 and over, adult: Secondary | ICD-10-CM | POA: Diagnosis not present

## 2017-06-06 DIAGNOSIS — J449 Chronic obstructive pulmonary disease, unspecified: Secondary | ICD-10-CM | POA: Diagnosis not present

## 2017-06-06 DIAGNOSIS — H269 Unspecified cataract: Secondary | ICD-10-CM | POA: Diagnosis not present

## 2017-06-06 DIAGNOSIS — I272 Pulmonary hypertension, unspecified: Secondary | ICD-10-CM | POA: Diagnosis not present

## 2017-06-06 DIAGNOSIS — M6281 Muscle weakness (generalized): Secondary | ICD-10-CM | POA: Diagnosis not present

## 2017-06-07 DIAGNOSIS — M6281 Muscle weakness (generalized): Secondary | ICD-10-CM | POA: Diagnosis not present

## 2017-06-07 DIAGNOSIS — I5043 Acute on chronic combined systolic (congestive) and diastolic (congestive) heart failure: Secondary | ICD-10-CM | POA: Diagnosis not present

## 2017-06-07 DIAGNOSIS — J9611 Chronic respiratory failure with hypoxia: Secondary | ICD-10-CM | POA: Diagnosis not present

## 2017-06-07 DIAGNOSIS — R278 Other lack of coordination: Secondary | ICD-10-CM | POA: Diagnosis not present

## 2017-06-07 DIAGNOSIS — J449 Chronic obstructive pulmonary disease, unspecified: Secondary | ICD-10-CM | POA: Diagnosis not present

## 2017-06-08 DIAGNOSIS — J9611 Chronic respiratory failure with hypoxia: Secondary | ICD-10-CM | POA: Diagnosis not present

## 2017-06-08 DIAGNOSIS — M6281 Muscle weakness (generalized): Secondary | ICD-10-CM | POA: Diagnosis not present

## 2017-06-08 DIAGNOSIS — I5043 Acute on chronic combined systolic (congestive) and diastolic (congestive) heart failure: Secondary | ICD-10-CM | POA: Diagnosis not present

## 2017-06-08 DIAGNOSIS — J449 Chronic obstructive pulmonary disease, unspecified: Secondary | ICD-10-CM | POA: Diagnosis not present

## 2017-06-08 DIAGNOSIS — R278 Other lack of coordination: Secondary | ICD-10-CM | POA: Diagnosis not present

## 2017-06-11 DIAGNOSIS — M6281 Muscle weakness (generalized): Secondary | ICD-10-CM | POA: Diagnosis not present

## 2017-06-11 DIAGNOSIS — J9611 Chronic respiratory failure with hypoxia: Secondary | ICD-10-CM | POA: Diagnosis not present

## 2017-06-11 DIAGNOSIS — R278 Other lack of coordination: Secondary | ICD-10-CM | POA: Diagnosis not present

## 2017-06-11 DIAGNOSIS — J449 Chronic obstructive pulmonary disease, unspecified: Secondary | ICD-10-CM | POA: Diagnosis not present

## 2017-06-11 DIAGNOSIS — F5101 Primary insomnia: Secondary | ICD-10-CM | POA: Diagnosis not present

## 2017-06-11 DIAGNOSIS — S91302A Unspecified open wound, left foot, initial encounter: Secondary | ICD-10-CM | POA: Diagnosis not present

## 2017-06-11 DIAGNOSIS — M79604 Pain in right leg: Secondary | ICD-10-CM | POA: Diagnosis not present

## 2017-06-11 DIAGNOSIS — F064 Anxiety disorder due to known physiological condition: Secondary | ICD-10-CM | POA: Diagnosis not present

## 2017-06-11 DIAGNOSIS — I5043 Acute on chronic combined systolic (congestive) and diastolic (congestive) heart failure: Secondary | ICD-10-CM | POA: Diagnosis not present

## 2017-06-11 DIAGNOSIS — M19072 Primary osteoarthritis, left ankle and foot: Secondary | ICD-10-CM | POA: Diagnosis not present

## 2017-06-11 DIAGNOSIS — M79662 Pain in left lower leg: Secondary | ICD-10-CM | POA: Diagnosis not present

## 2017-06-11 DIAGNOSIS — M1712 Unilateral primary osteoarthritis, left knee: Secondary | ICD-10-CM | POA: Diagnosis not present

## 2017-06-11 DIAGNOSIS — F331 Major depressive disorder, recurrent, moderate: Secondary | ICD-10-CM | POA: Diagnosis not present

## 2017-06-12 DIAGNOSIS — R278 Other lack of coordination: Secondary | ICD-10-CM | POA: Diagnosis not present

## 2017-06-12 DIAGNOSIS — M6281 Muscle weakness (generalized): Secondary | ICD-10-CM | POA: Diagnosis not present

## 2017-06-12 DIAGNOSIS — I5043 Acute on chronic combined systolic (congestive) and diastolic (congestive) heart failure: Secondary | ICD-10-CM | POA: Diagnosis not present

## 2017-06-12 DIAGNOSIS — J449 Chronic obstructive pulmonary disease, unspecified: Secondary | ICD-10-CM | POA: Diagnosis not present

## 2017-06-12 DIAGNOSIS — J9611 Chronic respiratory failure with hypoxia: Secondary | ICD-10-CM | POA: Diagnosis not present

## 2017-06-15 DIAGNOSIS — J9611 Chronic respiratory failure with hypoxia: Secondary | ICD-10-CM | POA: Diagnosis not present

## 2017-06-15 DIAGNOSIS — M1711 Unilateral primary osteoarthritis, right knee: Secondary | ICD-10-CM | POA: Diagnosis not present

## 2017-06-15 DIAGNOSIS — M1712 Unilateral primary osteoarthritis, left knee: Secondary | ICD-10-CM | POA: Diagnosis not present

## 2017-06-15 DIAGNOSIS — I5043 Acute on chronic combined systolic (congestive) and diastolic (congestive) heart failure: Secondary | ICD-10-CM | POA: Diagnosis not present

## 2017-06-15 DIAGNOSIS — M25571 Pain in right ankle and joints of right foot: Secondary | ICD-10-CM | POA: Diagnosis not present

## 2017-06-15 DIAGNOSIS — M7541 Impingement syndrome of right shoulder: Secondary | ICD-10-CM | POA: Diagnosis not present

## 2017-06-15 DIAGNOSIS — R278 Other lack of coordination: Secondary | ICD-10-CM | POA: Diagnosis not present

## 2017-06-15 DIAGNOSIS — J449 Chronic obstructive pulmonary disease, unspecified: Secondary | ICD-10-CM | POA: Diagnosis not present

## 2017-06-15 DIAGNOSIS — M6281 Muscle weakness (generalized): Secondary | ICD-10-CM | POA: Diagnosis not present

## 2017-06-18 DIAGNOSIS — E039 Hypothyroidism, unspecified: Secondary | ICD-10-CM | POA: Diagnosis not present

## 2017-06-18 DIAGNOSIS — M6281 Muscle weakness (generalized): Secondary | ICD-10-CM | POA: Diagnosis not present

## 2017-06-18 DIAGNOSIS — K219 Gastro-esophageal reflux disease without esophagitis: Secondary | ICD-10-CM | POA: Diagnosis not present

## 2017-06-18 DIAGNOSIS — D649 Anemia, unspecified: Secondary | ICD-10-CM | POA: Diagnosis not present

## 2017-06-18 DIAGNOSIS — I5043 Acute on chronic combined systolic (congestive) and diastolic (congestive) heart failure: Secondary | ICD-10-CM | POA: Diagnosis not present

## 2017-06-18 DIAGNOSIS — K5900 Constipation, unspecified: Secondary | ICD-10-CM | POA: Diagnosis not present

## 2017-06-18 DIAGNOSIS — J9611 Chronic respiratory failure with hypoxia: Secondary | ICD-10-CM | POA: Diagnosis not present

## 2017-06-18 DIAGNOSIS — I481 Persistent atrial fibrillation: Secondary | ICD-10-CM | POA: Diagnosis not present

## 2017-06-18 DIAGNOSIS — I10 Essential (primary) hypertension: Secondary | ICD-10-CM | POA: Diagnosis not present

## 2017-06-18 DIAGNOSIS — G609 Hereditary and idiopathic neuropathy, unspecified: Secondary | ICD-10-CM | POA: Diagnosis not present

## 2017-06-18 DIAGNOSIS — I2510 Atherosclerotic heart disease of native coronary artery without angina pectoris: Secondary | ICD-10-CM | POA: Diagnosis not present

## 2017-06-18 DIAGNOSIS — J449 Chronic obstructive pulmonary disease, unspecified: Secondary | ICD-10-CM | POA: Diagnosis not present

## 2017-06-18 DIAGNOSIS — I5042 Chronic combined systolic (congestive) and diastolic (congestive) heart failure: Secondary | ICD-10-CM | POA: Diagnosis not present

## 2017-06-18 DIAGNOSIS — E785 Hyperlipidemia, unspecified: Secondary | ICD-10-CM | POA: Diagnosis not present

## 2017-06-18 DIAGNOSIS — R278 Other lack of coordination: Secondary | ICD-10-CM | POA: Diagnosis not present

## 2017-07-09 DIAGNOSIS — F331 Major depressive disorder, recurrent, moderate: Secondary | ICD-10-CM | POA: Diagnosis not present

## 2017-07-09 DIAGNOSIS — F064 Anxiety disorder due to known physiological condition: Secondary | ICD-10-CM | POA: Diagnosis not present

## 2017-07-09 DIAGNOSIS — F5101 Primary insomnia: Secondary | ICD-10-CM | POA: Diagnosis not present

## 2017-07-10 DIAGNOSIS — K219 Gastro-esophageal reflux disease without esophagitis: Secondary | ICD-10-CM | POA: Diagnosis not present

## 2017-07-10 DIAGNOSIS — E785 Hyperlipidemia, unspecified: Secondary | ICD-10-CM | POA: Diagnosis not present

## 2017-07-10 DIAGNOSIS — D649 Anemia, unspecified: Secondary | ICD-10-CM | POA: Diagnosis not present

## 2017-07-10 DIAGNOSIS — I481 Persistent atrial fibrillation: Secondary | ICD-10-CM | POA: Diagnosis not present

## 2017-07-10 DIAGNOSIS — I10 Essential (primary) hypertension: Secondary | ICD-10-CM | POA: Diagnosis not present

## 2017-07-10 DIAGNOSIS — M6281 Muscle weakness (generalized): Secondary | ICD-10-CM | POA: Diagnosis not present

## 2017-07-10 DIAGNOSIS — I5042 Chronic combined systolic (congestive) and diastolic (congestive) heart failure: Secondary | ICD-10-CM | POA: Diagnosis not present

## 2017-07-10 DIAGNOSIS — G609 Hereditary and idiopathic neuropathy, unspecified: Secondary | ICD-10-CM | POA: Diagnosis not present

## 2017-07-10 DIAGNOSIS — J449 Chronic obstructive pulmonary disease, unspecified: Secondary | ICD-10-CM | POA: Diagnosis not present

## 2017-07-10 DIAGNOSIS — K5900 Constipation, unspecified: Secondary | ICD-10-CM | POA: Diagnosis not present

## 2017-07-10 DIAGNOSIS — I2510 Atherosclerotic heart disease of native coronary artery without angina pectoris: Secondary | ICD-10-CM | POA: Diagnosis not present

## 2017-07-10 DIAGNOSIS — E039 Hypothyroidism, unspecified: Secondary | ICD-10-CM | POA: Diagnosis not present

## 2017-07-12 DIAGNOSIS — E119 Type 2 diabetes mellitus without complications: Secondary | ICD-10-CM | POA: Diagnosis not present

## 2017-07-13 DIAGNOSIS — G894 Chronic pain syndrome: Secondary | ICD-10-CM | POA: Diagnosis not present

## 2017-07-18 DIAGNOSIS — I5043 Acute on chronic combined systolic (congestive) and diastolic (congestive) heart failure: Secondary | ICD-10-CM | POA: Diagnosis not present

## 2017-07-18 DIAGNOSIS — J9611 Chronic respiratory failure with hypoxia: Secondary | ICD-10-CM | POA: Diagnosis not present

## 2017-07-18 DIAGNOSIS — Z7401 Bed confinement status: Secondary | ICD-10-CM | POA: Diagnosis not present

## 2017-07-18 DIAGNOSIS — R2243 Localized swelling, mass and lump, lower limb, bilateral: Secondary | ICD-10-CM | POA: Diagnosis not present

## 2017-07-18 DIAGNOSIS — Z86711 Personal history of pulmonary embolism: Secondary | ICD-10-CM | POA: Diagnosis not present

## 2017-07-18 DIAGNOSIS — N179 Acute kidney failure, unspecified: Secondary | ICD-10-CM | POA: Diagnosis not present

## 2017-07-18 DIAGNOSIS — Z7901 Long term (current) use of anticoagulants: Secondary | ICD-10-CM | POA: Diagnosis not present

## 2017-07-18 DIAGNOSIS — R918 Other nonspecific abnormal finding of lung field: Secondary | ICD-10-CM | POA: Diagnosis not present

## 2017-07-18 DIAGNOSIS — I509 Heart failure, unspecified: Secondary | ICD-10-CM | POA: Diagnosis not present

## 2017-07-18 DIAGNOSIS — E038 Other specified hypothyroidism: Secondary | ICD-10-CM | POA: Diagnosis not present

## 2017-07-18 DIAGNOSIS — R079 Chest pain, unspecified: Secondary | ICD-10-CM | POA: Diagnosis not present

## 2017-07-18 DIAGNOSIS — I4891 Unspecified atrial fibrillation: Secondary | ICD-10-CM | POA: Diagnosis not present

## 2017-07-18 DIAGNOSIS — G4733 Obstructive sleep apnea (adult) (pediatric): Secondary | ICD-10-CM | POA: Diagnosis not present

## 2017-07-18 DIAGNOSIS — J984 Other disorders of lung: Secondary | ICD-10-CM | POA: Diagnosis not present

## 2017-07-18 DIAGNOSIS — R0789 Other chest pain: Secondary | ICD-10-CM | POA: Diagnosis not present

## 2017-07-18 DIAGNOSIS — J449 Chronic obstructive pulmonary disease, unspecified: Secondary | ICD-10-CM | POA: Diagnosis not present

## 2017-07-18 DIAGNOSIS — R0602 Shortness of breath: Secondary | ICD-10-CM | POA: Diagnosis not present

## 2017-07-18 DIAGNOSIS — J989 Respiratory disorder, unspecified: Secondary | ICD-10-CM | POA: Diagnosis not present

## 2017-07-18 DIAGNOSIS — I10 Essential (primary) hypertension: Secondary | ICD-10-CM | POA: Diagnosis not present

## 2017-07-18 DIAGNOSIS — Z6841 Body Mass Index (BMI) 40.0 and over, adult: Secondary | ICD-10-CM | POA: Diagnosis not present

## 2017-07-19 DIAGNOSIS — I482 Chronic atrial fibrillation: Secondary | ICD-10-CM | POA: Diagnosis not present

## 2017-07-19 DIAGNOSIS — I071 Rheumatic tricuspid insufficiency: Secondary | ICD-10-CM | POA: Diagnosis not present

## 2017-07-19 DIAGNOSIS — I5189 Other ill-defined heart diseases: Secondary | ICD-10-CM | POA: Diagnosis not present

## 2017-07-19 DIAGNOSIS — N179 Acute kidney failure, unspecified: Secondary | ICD-10-CM | POA: Diagnosis not present

## 2017-07-19 DIAGNOSIS — I2781 Cor pulmonale (chronic): Secondary | ICD-10-CM | POA: Diagnosis not present

## 2017-07-19 DIAGNOSIS — I517 Cardiomegaly: Secondary | ICD-10-CM | POA: Diagnosis not present

## 2017-07-19 DIAGNOSIS — I5023 Acute on chronic systolic (congestive) heart failure: Secondary | ICD-10-CM | POA: Diagnosis not present

## 2017-07-20 DIAGNOSIS — E876 Hypokalemia: Secondary | ICD-10-CM | POA: Diagnosis not present

## 2017-07-20 DIAGNOSIS — J989 Respiratory disorder, unspecified: Secondary | ICD-10-CM | POA: Diagnosis not present

## 2017-07-20 DIAGNOSIS — I5043 Acute on chronic combined systolic (congestive) and diastolic (congestive) heart failure: Secondary | ICD-10-CM | POA: Diagnosis not present

## 2017-07-20 DIAGNOSIS — D649 Anemia, unspecified: Secondary | ICD-10-CM | POA: Diagnosis not present

## 2017-07-21 DIAGNOSIS — D649 Anemia, unspecified: Secondary | ICD-10-CM | POA: Diagnosis not present

## 2017-07-21 DIAGNOSIS — E876 Hypokalemia: Secondary | ICD-10-CM | POA: Diagnosis not present

## 2017-07-21 DIAGNOSIS — I5043 Acute on chronic combined systolic (congestive) and diastolic (congestive) heart failure: Secondary | ICD-10-CM | POA: Diagnosis not present

## 2017-07-21 DIAGNOSIS — R770 Abnormality of albumin: Secondary | ICD-10-CM | POA: Diagnosis not present

## 2017-07-21 DIAGNOSIS — J989 Respiratory disorder, unspecified: Secondary | ICD-10-CM | POA: Diagnosis not present

## 2017-07-21 DIAGNOSIS — E611 Iron deficiency: Secondary | ICD-10-CM | POA: Diagnosis not present

## 2017-07-22 DIAGNOSIS — J989 Respiratory disorder, unspecified: Secondary | ICD-10-CM | POA: Diagnosis not present

## 2017-07-22 DIAGNOSIS — E876 Hypokalemia: Secondary | ICD-10-CM | POA: Diagnosis not present

## 2017-07-22 DIAGNOSIS — R770 Abnormality of albumin: Secondary | ICD-10-CM | POA: Diagnosis not present

## 2017-07-22 DIAGNOSIS — I5043 Acute on chronic combined systolic (congestive) and diastolic (congestive) heart failure: Secondary | ICD-10-CM | POA: Diagnosis not present

## 2017-07-22 DIAGNOSIS — D649 Anemia, unspecified: Secondary | ICD-10-CM | POA: Diagnosis not present

## 2017-07-22 DIAGNOSIS — E611 Iron deficiency: Secondary | ICD-10-CM | POA: Diagnosis not present

## 2017-07-23 DIAGNOSIS — E611 Iron deficiency: Secondary | ICD-10-CM | POA: Diagnosis not present

## 2017-07-23 DIAGNOSIS — I5043 Acute on chronic combined systolic (congestive) and diastolic (congestive) heart failure: Secondary | ICD-10-CM | POA: Diagnosis not present

## 2017-07-23 DIAGNOSIS — E876 Hypokalemia: Secondary | ICD-10-CM | POA: Diagnosis not present

## 2017-07-23 DIAGNOSIS — R770 Abnormality of albumin: Secondary | ICD-10-CM | POA: Diagnosis not present

## 2017-07-24 DIAGNOSIS — I5043 Acute on chronic combined systolic (congestive) and diastolic (congestive) heart failure: Secondary | ICD-10-CM | POA: Diagnosis not present

## 2017-07-24 DIAGNOSIS — E038 Other specified hypothyroidism: Secondary | ICD-10-CM | POA: Diagnosis not present

## 2017-07-24 DIAGNOSIS — I5023 Acute on chronic systolic (congestive) heart failure: Secondary | ICD-10-CM | POA: Diagnosis not present

## 2017-07-24 DIAGNOSIS — G4733 Obstructive sleep apnea (adult) (pediatric): Secondary | ICD-10-CM | POA: Diagnosis not present

## 2017-07-24 DIAGNOSIS — E785 Hyperlipidemia, unspecified: Secondary | ICD-10-CM | POA: Diagnosis not present

## 2017-07-24 DIAGNOSIS — N179 Acute kidney failure, unspecified: Secondary | ICD-10-CM | POA: Diagnosis not present

## 2017-07-24 DIAGNOSIS — R079 Chest pain, unspecified: Secondary | ICD-10-CM | POA: Diagnosis not present

## 2017-07-24 DIAGNOSIS — R770 Abnormality of albumin: Secondary | ICD-10-CM | POA: Diagnosis not present

## 2017-07-24 DIAGNOSIS — D649 Anemia, unspecified: Secondary | ICD-10-CM | POA: Diagnosis not present

## 2017-07-25 DIAGNOSIS — R770 Abnormality of albumin: Secondary | ICD-10-CM | POA: Diagnosis not present

## 2017-07-25 DIAGNOSIS — R079 Chest pain, unspecified: Secondary | ICD-10-CM | POA: Diagnosis not present

## 2017-07-25 DIAGNOSIS — E785 Hyperlipidemia, unspecified: Secondary | ICD-10-CM | POA: Diagnosis not present

## 2017-07-25 DIAGNOSIS — I5043 Acute on chronic combined systolic (congestive) and diastolic (congestive) heart failure: Secondary | ICD-10-CM | POA: Diagnosis not present

## 2017-07-25 DIAGNOSIS — N179 Acute kidney failure, unspecified: Secondary | ICD-10-CM | POA: Diagnosis not present

## 2017-07-25 DIAGNOSIS — D649 Anemia, unspecified: Secondary | ICD-10-CM | POA: Diagnosis not present

## 2017-07-25 DIAGNOSIS — E038 Other specified hypothyroidism: Secondary | ICD-10-CM | POA: Diagnosis not present

## 2017-07-25 DIAGNOSIS — I5023 Acute on chronic systolic (congestive) heart failure: Secondary | ICD-10-CM | POA: Diagnosis not present

## 2017-07-25 DIAGNOSIS — G4733 Obstructive sleep apnea (adult) (pediatric): Secondary | ICD-10-CM | POA: Diagnosis not present

## 2017-07-26 DIAGNOSIS — E785 Hyperlipidemia, unspecified: Secondary | ICD-10-CM | POA: Diagnosis not present

## 2017-07-26 DIAGNOSIS — E038 Other specified hypothyroidism: Secondary | ICD-10-CM | POA: Diagnosis not present

## 2017-07-26 DIAGNOSIS — I5023 Acute on chronic systolic (congestive) heart failure: Secondary | ICD-10-CM | POA: Diagnosis not present

## 2017-07-26 DIAGNOSIS — N179 Acute kidney failure, unspecified: Secondary | ICD-10-CM | POA: Diagnosis not present

## 2017-07-26 DIAGNOSIS — G4733 Obstructive sleep apnea (adult) (pediatric): Secondary | ICD-10-CM | POA: Diagnosis not present

## 2017-07-26 DIAGNOSIS — R770 Abnormality of albumin: Secondary | ICD-10-CM | POA: Diagnosis not present

## 2017-07-26 DIAGNOSIS — I5043 Acute on chronic combined systolic (congestive) and diastolic (congestive) heart failure: Secondary | ICD-10-CM | POA: Diagnosis not present

## 2017-07-26 DIAGNOSIS — D649 Anemia, unspecified: Secondary | ICD-10-CM | POA: Diagnosis not present

## 2017-07-26 DIAGNOSIS — R079 Chest pain, unspecified: Secondary | ICD-10-CM | POA: Diagnosis not present

## 2017-07-27 DIAGNOSIS — N179 Acute kidney failure, unspecified: Secondary | ICD-10-CM | POA: Diagnosis not present

## 2017-07-27 DIAGNOSIS — G4733 Obstructive sleep apnea (adult) (pediatric): Secondary | ICD-10-CM | POA: Diagnosis not present

## 2017-07-27 DIAGNOSIS — R079 Chest pain, unspecified: Secondary | ICD-10-CM | POA: Diagnosis not present

## 2017-07-27 DIAGNOSIS — I5023 Acute on chronic systolic (congestive) heart failure: Secondary | ICD-10-CM | POA: Diagnosis not present

## 2017-07-27 DIAGNOSIS — I5043 Acute on chronic combined systolic (congestive) and diastolic (congestive) heart failure: Secondary | ICD-10-CM | POA: Diagnosis not present

## 2017-07-27 DIAGNOSIS — D649 Anemia, unspecified: Secondary | ICD-10-CM | POA: Diagnosis not present

## 2017-07-27 DIAGNOSIS — E038 Other specified hypothyroidism: Secondary | ICD-10-CM | POA: Diagnosis not present

## 2017-07-27 DIAGNOSIS — R770 Abnormality of albumin: Secondary | ICD-10-CM | POA: Diagnosis not present

## 2017-07-27 DIAGNOSIS — E785 Hyperlipidemia, unspecified: Secondary | ICD-10-CM | POA: Diagnosis not present

## 2017-07-28 DIAGNOSIS — I5023 Acute on chronic systolic (congestive) heart failure: Secondary | ICD-10-CM | POA: Diagnosis not present

## 2017-07-28 DIAGNOSIS — E785 Hyperlipidemia, unspecified: Secondary | ICD-10-CM | POA: Diagnosis not present

## 2017-07-28 DIAGNOSIS — R079 Chest pain, unspecified: Secondary | ICD-10-CM | POA: Diagnosis not present

## 2017-07-28 DIAGNOSIS — D649 Anemia, unspecified: Secondary | ICD-10-CM | POA: Diagnosis not present

## 2017-07-28 DIAGNOSIS — R770 Abnormality of albumin: Secondary | ICD-10-CM | POA: Diagnosis not present

## 2017-07-28 DIAGNOSIS — G4733 Obstructive sleep apnea (adult) (pediatric): Secondary | ICD-10-CM | POA: Diagnosis not present

## 2017-07-28 DIAGNOSIS — N179 Acute kidney failure, unspecified: Secondary | ICD-10-CM | POA: Diagnosis not present

## 2017-07-28 DIAGNOSIS — I5043 Acute on chronic combined systolic (congestive) and diastolic (congestive) heart failure: Secondary | ICD-10-CM | POA: Diagnosis not present

## 2017-07-28 DIAGNOSIS — E038 Other specified hypothyroidism: Secondary | ICD-10-CM | POA: Diagnosis not present

## 2017-07-29 DIAGNOSIS — N179 Acute kidney failure, unspecified: Secondary | ICD-10-CM | POA: Diagnosis not present

## 2017-07-29 DIAGNOSIS — E038 Other specified hypothyroidism: Secondary | ICD-10-CM | POA: Diagnosis not present

## 2017-07-29 DIAGNOSIS — I5023 Acute on chronic systolic (congestive) heart failure: Secondary | ICD-10-CM | POA: Diagnosis not present

## 2017-07-29 DIAGNOSIS — G4733 Obstructive sleep apnea (adult) (pediatric): Secondary | ICD-10-CM | POA: Diagnosis not present

## 2017-07-29 DIAGNOSIS — E785 Hyperlipidemia, unspecified: Secondary | ICD-10-CM | POA: Diagnosis not present

## 2017-07-29 DIAGNOSIS — R079 Chest pain, unspecified: Secondary | ICD-10-CM | POA: Diagnosis not present

## 2017-07-29 DIAGNOSIS — R770 Abnormality of albumin: Secondary | ICD-10-CM | POA: Diagnosis not present

## 2017-07-29 DIAGNOSIS — D649 Anemia, unspecified: Secondary | ICD-10-CM | POA: Diagnosis not present

## 2017-07-29 DIAGNOSIS — I5043 Acute on chronic combined systolic (congestive) and diastolic (congestive) heart failure: Secondary | ICD-10-CM | POA: Diagnosis not present

## 2017-07-30 DIAGNOSIS — E785 Hyperlipidemia, unspecified: Secondary | ICD-10-CM | POA: Diagnosis not present

## 2017-07-30 DIAGNOSIS — G4733 Obstructive sleep apnea (adult) (pediatric): Secondary | ICD-10-CM | POA: Diagnosis not present

## 2017-07-30 DIAGNOSIS — R079 Chest pain, unspecified: Secondary | ICD-10-CM | POA: Diagnosis not present

## 2017-07-30 DIAGNOSIS — N179 Acute kidney failure, unspecified: Secondary | ICD-10-CM | POA: Diagnosis not present

## 2017-07-30 DIAGNOSIS — D649 Anemia, unspecified: Secondary | ICD-10-CM | POA: Diagnosis not present

## 2017-07-30 DIAGNOSIS — R770 Abnormality of albumin: Secondary | ICD-10-CM | POA: Diagnosis not present

## 2017-07-30 DIAGNOSIS — I5043 Acute on chronic combined systolic (congestive) and diastolic (congestive) heart failure: Secondary | ICD-10-CM | POA: Diagnosis not present

## 2017-07-30 DIAGNOSIS — E038 Other specified hypothyroidism: Secondary | ICD-10-CM | POA: Diagnosis not present

## 2017-07-30 DIAGNOSIS — I5023 Acute on chronic systolic (congestive) heart failure: Secondary | ICD-10-CM | POA: Diagnosis not present

## 2017-07-31 DIAGNOSIS — I2781 Cor pulmonale (chronic): Secondary | ICD-10-CM | POA: Diagnosis not present

## 2017-07-31 DIAGNOSIS — N179 Acute kidney failure, unspecified: Secondary | ICD-10-CM | POA: Diagnosis not present

## 2017-07-31 DIAGNOSIS — I5023 Acute on chronic systolic (congestive) heart failure: Secondary | ICD-10-CM | POA: Diagnosis not present

## 2017-07-31 DIAGNOSIS — I482 Chronic atrial fibrillation: Secondary | ICD-10-CM | POA: Diagnosis not present

## 2017-08-01 DIAGNOSIS — I2781 Cor pulmonale (chronic): Secondary | ICD-10-CM | POA: Diagnosis not present

## 2017-08-01 DIAGNOSIS — I482 Chronic atrial fibrillation: Secondary | ICD-10-CM | POA: Diagnosis not present

## 2017-08-01 DIAGNOSIS — N179 Acute kidney failure, unspecified: Secondary | ICD-10-CM | POA: Diagnosis not present

## 2017-08-01 DIAGNOSIS — I5023 Acute on chronic systolic (congestive) heart failure: Secondary | ICD-10-CM | POA: Diagnosis not present

## 2017-08-02 DIAGNOSIS — I10 Essential (primary) hypertension: Secondary | ICD-10-CM | POA: Diagnosis not present

## 2017-08-02 DIAGNOSIS — M1380 Other specified arthritis, unspecified site: Secondary | ICD-10-CM | POA: Diagnosis not present

## 2017-08-02 DIAGNOSIS — N179 Acute kidney failure, unspecified: Secondary | ICD-10-CM | POA: Diagnosis not present

## 2017-08-02 DIAGNOSIS — K5900 Constipation, unspecified: Secondary | ICD-10-CM | POA: Diagnosis not present

## 2017-08-02 DIAGNOSIS — K219 Gastro-esophageal reflux disease without esophagitis: Secondary | ICD-10-CM | POA: Diagnosis not present

## 2017-08-02 DIAGNOSIS — G8929 Other chronic pain: Secondary | ICD-10-CM | POA: Diagnosis not present

## 2017-08-02 DIAGNOSIS — M6281 Muscle weakness (generalized): Secondary | ICD-10-CM | POA: Diagnosis not present

## 2017-08-02 DIAGNOSIS — J449 Chronic obstructive pulmonary disease, unspecified: Secondary | ICD-10-CM | POA: Diagnosis not present

## 2017-08-02 DIAGNOSIS — I5042 Chronic combined systolic (congestive) and diastolic (congestive) heart failure: Secondary | ICD-10-CM | POA: Diagnosis not present

## 2017-08-02 DIAGNOSIS — F5101 Primary insomnia: Secondary | ICD-10-CM | POA: Diagnosis not present

## 2017-08-02 DIAGNOSIS — R2681 Unsteadiness on feet: Secondary | ICD-10-CM | POA: Diagnosis not present

## 2017-08-02 DIAGNOSIS — I5043 Acute on chronic combined systolic (congestive) and diastolic (congestive) heart failure: Secondary | ICD-10-CM | POA: Diagnosis not present

## 2017-08-02 DIAGNOSIS — F331 Major depressive disorder, recurrent, moderate: Secondary | ICD-10-CM | POA: Diagnosis not present

## 2017-08-02 DIAGNOSIS — I272 Pulmonary hypertension, unspecified: Secondary | ICD-10-CM | POA: Diagnosis not present

## 2017-08-02 DIAGNOSIS — L89152 Pressure ulcer of sacral region, stage 2: Secondary | ICD-10-CM | POA: Diagnosis not present

## 2017-08-02 DIAGNOSIS — N390 Urinary tract infection, site not specified: Secondary | ICD-10-CM | POA: Diagnosis not present

## 2017-08-02 DIAGNOSIS — Z7409 Other reduced mobility: Secondary | ICD-10-CM | POA: Diagnosis not present

## 2017-08-02 DIAGNOSIS — I481 Persistent atrial fibrillation: Secondary | ICD-10-CM | POA: Diagnosis not present

## 2017-08-02 DIAGNOSIS — G4733 Obstructive sleep apnea (adult) (pediatric): Secondary | ICD-10-CM | POA: Diagnosis not present

## 2017-08-02 DIAGNOSIS — R2689 Other abnormalities of gait and mobility: Secondary | ICD-10-CM | POA: Diagnosis not present

## 2017-08-02 DIAGNOSIS — I2781 Cor pulmonale (chronic): Secondary | ICD-10-CM | POA: Diagnosis not present

## 2017-08-02 DIAGNOSIS — G4700 Insomnia, unspecified: Secondary | ICD-10-CM | POA: Diagnosis not present

## 2017-08-02 DIAGNOSIS — E039 Hypothyroidism, unspecified: Secondary | ICD-10-CM | POA: Diagnosis not present

## 2017-08-02 DIAGNOSIS — J9611 Chronic respiratory failure with hypoxia: Secondary | ICD-10-CM | POA: Diagnosis not present

## 2017-08-02 DIAGNOSIS — H269 Unspecified cataract: Secondary | ICD-10-CM | POA: Diagnosis not present

## 2017-08-02 DIAGNOSIS — I4891 Unspecified atrial fibrillation: Secondary | ICD-10-CM | POA: Diagnosis not present

## 2017-08-02 DIAGNOSIS — E559 Vitamin D deficiency, unspecified: Secondary | ICD-10-CM | POA: Diagnosis not present

## 2017-08-02 DIAGNOSIS — R269 Unspecified abnormalities of gait and mobility: Secondary | ICD-10-CM | POA: Diagnosis not present

## 2017-08-02 DIAGNOSIS — Z6841 Body Mass Index (BMI) 40.0 and over, adult: Secondary | ICD-10-CM | POA: Diagnosis not present

## 2017-08-02 DIAGNOSIS — Z23 Encounter for immunization: Secondary | ICD-10-CM | POA: Diagnosis not present

## 2017-08-02 DIAGNOSIS — F064 Anxiety disorder due to known physiological condition: Secondary | ICD-10-CM | POA: Diagnosis not present

## 2017-08-02 DIAGNOSIS — E1159 Type 2 diabetes mellitus with other circulatory complications: Secondary | ICD-10-CM | POA: Diagnosis not present

## 2017-08-02 DIAGNOSIS — E876 Hypokalemia: Secondary | ICD-10-CM | POA: Diagnosis not present

## 2017-08-02 DIAGNOSIS — E785 Hyperlipidemia, unspecified: Secondary | ICD-10-CM | POA: Diagnosis not present

## 2017-08-02 DIAGNOSIS — R278 Other lack of coordination: Secondary | ICD-10-CM | POA: Diagnosis not present

## 2017-08-02 DIAGNOSIS — R1030 Lower abdominal pain, unspecified: Secondary | ICD-10-CM | POA: Diagnosis not present

## 2017-08-02 DIAGNOSIS — I2510 Atherosclerotic heart disease of native coronary artery without angina pectoris: Secondary | ICD-10-CM | POA: Diagnosis not present

## 2017-08-02 DIAGNOSIS — I482 Chronic atrial fibrillation: Secondary | ICD-10-CM | POA: Diagnosis not present

## 2017-08-02 DIAGNOSIS — D649 Anemia, unspecified: Secondary | ICD-10-CM | POA: Diagnosis not present

## 2017-08-02 DIAGNOSIS — G609 Hereditary and idiopathic neuropathy, unspecified: Secondary | ICD-10-CM | POA: Diagnosis not present

## 2017-08-02 DIAGNOSIS — F419 Anxiety disorder, unspecified: Secondary | ICD-10-CM | POA: Diagnosis not present

## 2017-08-02 DIAGNOSIS — E539 Vitamin B deficiency, unspecified: Secondary | ICD-10-CM | POA: Diagnosis not present

## 2017-08-02 DIAGNOSIS — I5023 Acute on chronic systolic (congestive) heart failure: Secondary | ICD-10-CM | POA: Diagnosis not present

## 2017-08-05 DIAGNOSIS — G8929 Other chronic pain: Secondary | ICD-10-CM | POA: Diagnosis not present

## 2017-08-05 DIAGNOSIS — I10 Essential (primary) hypertension: Secondary | ICD-10-CM | POA: Diagnosis not present

## 2017-08-05 DIAGNOSIS — G609 Hereditary and idiopathic neuropathy, unspecified: Secondary | ICD-10-CM | POA: Diagnosis not present

## 2017-08-05 DIAGNOSIS — I5043 Acute on chronic combined systolic (congestive) and diastolic (congestive) heart failure: Secondary | ICD-10-CM | POA: Diagnosis not present

## 2017-08-05 DIAGNOSIS — E876 Hypokalemia: Secondary | ICD-10-CM | POA: Diagnosis not present

## 2017-08-05 DIAGNOSIS — K219 Gastro-esophageal reflux disease without esophagitis: Secondary | ICD-10-CM | POA: Diagnosis not present

## 2017-08-05 DIAGNOSIS — E039 Hypothyroidism, unspecified: Secondary | ICD-10-CM | POA: Diagnosis not present

## 2017-08-05 DIAGNOSIS — J449 Chronic obstructive pulmonary disease, unspecified: Secondary | ICD-10-CM | POA: Diagnosis not present

## 2017-08-05 DIAGNOSIS — I2510 Atherosclerotic heart disease of native coronary artery without angina pectoris: Secondary | ICD-10-CM | POA: Diagnosis not present

## 2017-08-05 DIAGNOSIS — I481 Persistent atrial fibrillation: Secondary | ICD-10-CM | POA: Diagnosis not present

## 2017-08-05 DIAGNOSIS — E785 Hyperlipidemia, unspecified: Secondary | ICD-10-CM | POA: Diagnosis not present

## 2017-08-05 DIAGNOSIS — M6281 Muscle weakness (generalized): Secondary | ICD-10-CM | POA: Diagnosis not present

## 2017-08-06 DIAGNOSIS — M6281 Muscle weakness (generalized): Secondary | ICD-10-CM | POA: Diagnosis not present

## 2017-08-06 DIAGNOSIS — F064 Anxiety disorder due to known physiological condition: Secondary | ICD-10-CM | POA: Diagnosis not present

## 2017-08-06 DIAGNOSIS — G4700 Insomnia, unspecified: Secondary | ICD-10-CM | POA: Diagnosis not present

## 2017-08-06 DIAGNOSIS — F331 Major depressive disorder, recurrent, moderate: Secondary | ICD-10-CM | POA: Diagnosis not present

## 2017-08-07 DIAGNOSIS — E039 Hypothyroidism, unspecified: Secondary | ICD-10-CM | POA: Diagnosis not present

## 2017-08-07 DIAGNOSIS — J449 Chronic obstructive pulmonary disease, unspecified: Secondary | ICD-10-CM | POA: Diagnosis not present

## 2017-08-07 DIAGNOSIS — E876 Hypokalemia: Secondary | ICD-10-CM | POA: Diagnosis not present

## 2017-08-07 DIAGNOSIS — I481 Persistent atrial fibrillation: Secondary | ICD-10-CM | POA: Diagnosis not present

## 2017-08-07 DIAGNOSIS — I10 Essential (primary) hypertension: Secondary | ICD-10-CM | POA: Diagnosis not present

## 2017-08-07 DIAGNOSIS — G8929 Other chronic pain: Secondary | ICD-10-CM | POA: Diagnosis not present

## 2017-08-07 DIAGNOSIS — G609 Hereditary and idiopathic neuropathy, unspecified: Secondary | ICD-10-CM | POA: Diagnosis not present

## 2017-08-07 DIAGNOSIS — I2510 Atherosclerotic heart disease of native coronary artery without angina pectoris: Secondary | ICD-10-CM | POA: Diagnosis not present

## 2017-08-07 DIAGNOSIS — M6281 Muscle weakness (generalized): Secondary | ICD-10-CM | POA: Diagnosis not present

## 2017-08-07 DIAGNOSIS — E785 Hyperlipidemia, unspecified: Secondary | ICD-10-CM | POA: Diagnosis not present

## 2017-08-07 DIAGNOSIS — I5043 Acute on chronic combined systolic (congestive) and diastolic (congestive) heart failure: Secondary | ICD-10-CM | POA: Diagnosis not present

## 2017-08-07 DIAGNOSIS — K219 Gastro-esophageal reflux disease without esophagitis: Secondary | ICD-10-CM | POA: Diagnosis not present

## 2017-08-13 DIAGNOSIS — E539 Vitamin B deficiency, unspecified: Secondary | ICD-10-CM | POA: Diagnosis not present

## 2017-08-13 DIAGNOSIS — M6281 Muscle weakness (generalized): Secondary | ICD-10-CM | POA: Diagnosis not present

## 2017-08-13 DIAGNOSIS — K5900 Constipation, unspecified: Secondary | ICD-10-CM | POA: Diagnosis not present

## 2017-08-13 DIAGNOSIS — E039 Hypothyroidism, unspecified: Secondary | ICD-10-CM | POA: Diagnosis not present

## 2017-08-13 DIAGNOSIS — K219 Gastro-esophageal reflux disease without esophagitis: Secondary | ICD-10-CM | POA: Diagnosis not present

## 2017-08-13 DIAGNOSIS — G4733 Obstructive sleep apnea (adult) (pediatric): Secondary | ICD-10-CM | POA: Diagnosis not present

## 2017-08-13 DIAGNOSIS — G609 Hereditary and idiopathic neuropathy, unspecified: Secondary | ICD-10-CM | POA: Diagnosis not present

## 2017-08-13 DIAGNOSIS — I481 Persistent atrial fibrillation: Secondary | ICD-10-CM | POA: Diagnosis not present

## 2017-08-13 DIAGNOSIS — E785 Hyperlipidemia, unspecified: Secondary | ICD-10-CM | POA: Diagnosis not present

## 2017-08-13 DIAGNOSIS — I2510 Atherosclerotic heart disease of native coronary artery without angina pectoris: Secondary | ICD-10-CM | POA: Diagnosis not present

## 2017-08-13 DIAGNOSIS — I5043 Acute on chronic combined systolic (congestive) and diastolic (congestive) heart failure: Secondary | ICD-10-CM | POA: Diagnosis not present

## 2017-08-13 DIAGNOSIS — G8929 Other chronic pain: Secondary | ICD-10-CM | POA: Diagnosis not present

## 2017-08-28 DIAGNOSIS — I481 Persistent atrial fibrillation: Secondary | ICD-10-CM | POA: Diagnosis not present

## 2017-08-28 DIAGNOSIS — E039 Hypothyroidism, unspecified: Secondary | ICD-10-CM | POA: Diagnosis not present

## 2017-08-28 DIAGNOSIS — I10 Essential (primary) hypertension: Secondary | ICD-10-CM | POA: Diagnosis not present

## 2017-08-28 DIAGNOSIS — I5042 Chronic combined systolic (congestive) and diastolic (congestive) heart failure: Secondary | ICD-10-CM | POA: Diagnosis not present

## 2017-08-28 DIAGNOSIS — I2510 Atherosclerotic heart disease of native coronary artery without angina pectoris: Secondary | ICD-10-CM | POA: Diagnosis not present

## 2017-08-28 DIAGNOSIS — E785 Hyperlipidemia, unspecified: Secondary | ICD-10-CM | POA: Diagnosis not present

## 2017-08-28 DIAGNOSIS — K5900 Constipation, unspecified: Secondary | ICD-10-CM | POA: Diagnosis not present

## 2017-08-28 DIAGNOSIS — D649 Anemia, unspecified: Secondary | ICD-10-CM | POA: Diagnosis not present

## 2017-08-28 DIAGNOSIS — J449 Chronic obstructive pulmonary disease, unspecified: Secondary | ICD-10-CM | POA: Diagnosis not present

## 2017-08-28 DIAGNOSIS — K219 Gastro-esophageal reflux disease without esophagitis: Secondary | ICD-10-CM | POA: Diagnosis not present

## 2017-08-28 DIAGNOSIS — G609 Hereditary and idiopathic neuropathy, unspecified: Secondary | ICD-10-CM | POA: Diagnosis not present

## 2017-08-28 DIAGNOSIS — M6281 Muscle weakness (generalized): Secondary | ICD-10-CM | POA: Diagnosis not present

## 2017-09-03 DIAGNOSIS — E876 Hypokalemia: Secondary | ICD-10-CM | POA: Diagnosis not present

## 2017-09-03 DIAGNOSIS — E039 Hypothyroidism, unspecified: Secondary | ICD-10-CM | POA: Diagnosis not present

## 2017-09-03 DIAGNOSIS — I2510 Atherosclerotic heart disease of native coronary artery without angina pectoris: Secondary | ICD-10-CM | POA: Diagnosis not present

## 2017-09-03 DIAGNOSIS — F331 Major depressive disorder, recurrent, moderate: Secondary | ICD-10-CM | POA: Diagnosis not present

## 2017-09-03 DIAGNOSIS — I10 Essential (primary) hypertension: Secondary | ICD-10-CM | POA: Diagnosis not present

## 2017-09-03 DIAGNOSIS — G4700 Insomnia, unspecified: Secondary | ICD-10-CM | POA: Diagnosis not present

## 2017-09-03 DIAGNOSIS — E785 Hyperlipidemia, unspecified: Secondary | ICD-10-CM | POA: Diagnosis not present

## 2017-09-03 DIAGNOSIS — F419 Anxiety disorder, unspecified: Secondary | ICD-10-CM | POA: Diagnosis not present

## 2017-09-03 DIAGNOSIS — G609 Hereditary and idiopathic neuropathy, unspecified: Secondary | ICD-10-CM | POA: Diagnosis not present

## 2017-09-03 DIAGNOSIS — M6281 Muscle weakness (generalized): Secondary | ICD-10-CM | POA: Diagnosis not present

## 2017-09-03 DIAGNOSIS — G4733 Obstructive sleep apnea (adult) (pediatric): Secondary | ICD-10-CM | POA: Diagnosis not present

## 2017-09-03 DIAGNOSIS — G8929 Other chronic pain: Secondary | ICD-10-CM | POA: Diagnosis not present

## 2017-09-03 DIAGNOSIS — F064 Anxiety disorder due to known physiological condition: Secondary | ICD-10-CM | POA: Diagnosis not present

## 2017-09-05 DIAGNOSIS — L89152 Pressure ulcer of sacral region, stage 2: Secondary | ICD-10-CM | POA: Diagnosis not present

## 2017-09-10 DIAGNOSIS — E876 Hypokalemia: Secondary | ICD-10-CM | POA: Diagnosis not present

## 2017-09-10 DIAGNOSIS — R1030 Lower abdominal pain, unspecified: Secondary | ICD-10-CM | POA: Diagnosis not present

## 2017-09-12 DIAGNOSIS — L89152 Pressure ulcer of sacral region, stage 2: Secondary | ICD-10-CM | POA: Diagnosis not present

## 2017-09-20 DIAGNOSIS — M6281 Muscle weakness (generalized): Secondary | ICD-10-CM | POA: Diagnosis not present

## 2017-09-20 DIAGNOSIS — K5900 Constipation, unspecified: Secondary | ICD-10-CM | POA: Diagnosis not present

## 2017-09-20 DIAGNOSIS — I5042 Chronic combined systolic (congestive) and diastolic (congestive) heart failure: Secondary | ICD-10-CM | POA: Diagnosis not present

## 2017-09-20 DIAGNOSIS — E039 Hypothyroidism, unspecified: Secondary | ICD-10-CM | POA: Diagnosis not present

## 2017-09-20 DIAGNOSIS — K219 Gastro-esophageal reflux disease without esophagitis: Secondary | ICD-10-CM | POA: Diagnosis not present

## 2017-09-20 DIAGNOSIS — I10 Essential (primary) hypertension: Secondary | ICD-10-CM | POA: Diagnosis not present

## 2017-09-20 DIAGNOSIS — G8929 Other chronic pain: Secondary | ICD-10-CM | POA: Diagnosis not present

## 2017-09-20 DIAGNOSIS — I481 Persistent atrial fibrillation: Secondary | ICD-10-CM | POA: Diagnosis not present

## 2017-09-20 DIAGNOSIS — E785 Hyperlipidemia, unspecified: Secondary | ICD-10-CM | POA: Diagnosis not present

## 2017-09-20 DIAGNOSIS — I2510 Atherosclerotic heart disease of native coronary artery without angina pectoris: Secondary | ICD-10-CM | POA: Diagnosis not present

## 2017-09-20 DIAGNOSIS — D649 Anemia, unspecified: Secondary | ICD-10-CM | POA: Diagnosis not present

## 2017-09-20 DIAGNOSIS — G609 Hereditary and idiopathic neuropathy, unspecified: Secondary | ICD-10-CM | POA: Diagnosis not present

## 2017-09-24 DIAGNOSIS — F064 Anxiety disorder due to known physiological condition: Secondary | ICD-10-CM | POA: Diagnosis not present

## 2017-09-24 DIAGNOSIS — G4700 Insomnia, unspecified: Secondary | ICD-10-CM | POA: Diagnosis not present

## 2017-09-24 DIAGNOSIS — F331 Major depressive disorder, recurrent, moderate: Secondary | ICD-10-CM | POA: Diagnosis not present

## 2017-10-01 DIAGNOSIS — E785 Hyperlipidemia, unspecified: Secondary | ICD-10-CM | POA: Diagnosis not present

## 2017-10-01 DIAGNOSIS — F419 Anxiety disorder, unspecified: Secondary | ICD-10-CM | POA: Diagnosis not present

## 2017-10-01 DIAGNOSIS — F331 Major depressive disorder, recurrent, moderate: Secondary | ICD-10-CM | POA: Diagnosis not present

## 2017-10-01 DIAGNOSIS — G8929 Other chronic pain: Secondary | ICD-10-CM | POA: Diagnosis not present

## 2017-10-01 DIAGNOSIS — E876 Hypokalemia: Secondary | ICD-10-CM | POA: Diagnosis not present

## 2017-10-01 DIAGNOSIS — G4733 Obstructive sleep apnea (adult) (pediatric): Secondary | ICD-10-CM | POA: Diagnosis not present

## 2017-10-01 DIAGNOSIS — G609 Hereditary and idiopathic neuropathy, unspecified: Secondary | ICD-10-CM | POA: Diagnosis not present

## 2017-10-01 DIAGNOSIS — I10 Essential (primary) hypertension: Secondary | ICD-10-CM | POA: Diagnosis not present

## 2017-10-01 DIAGNOSIS — I2510 Atherosclerotic heart disease of native coronary artery without angina pectoris: Secondary | ICD-10-CM | POA: Diagnosis not present

## 2017-10-01 DIAGNOSIS — G4700 Insomnia, unspecified: Secondary | ICD-10-CM | POA: Diagnosis not present

## 2017-10-01 DIAGNOSIS — E039 Hypothyroidism, unspecified: Secondary | ICD-10-CM | POA: Diagnosis not present

## 2017-10-04 DIAGNOSIS — F5101 Primary insomnia: Secondary | ICD-10-CM | POA: Diagnosis not present

## 2017-10-04 DIAGNOSIS — F064 Anxiety disorder due to known physiological condition: Secondary | ICD-10-CM | POA: Diagnosis not present

## 2017-10-04 DIAGNOSIS — F331 Major depressive disorder, recurrent, moderate: Secondary | ICD-10-CM | POA: Diagnosis not present

## 2017-10-05 DIAGNOSIS — I5042 Chronic combined systolic (congestive) and diastolic (congestive) heart failure: Secondary | ICD-10-CM | POA: Diagnosis not present

## 2017-10-05 DIAGNOSIS — I429 Cardiomyopathy, unspecified: Secondary | ICD-10-CM | POA: Diagnosis not present

## 2017-10-05 DIAGNOSIS — R600 Localized edema: Secondary | ICD-10-CM | POA: Diagnosis not present

## 2017-10-05 DIAGNOSIS — E8770 Fluid overload, unspecified: Secondary | ICD-10-CM | POA: Diagnosis not present

## 2017-10-05 DIAGNOSIS — Z9981 Dependence on supplemental oxygen: Secondary | ICD-10-CM | POA: Diagnosis not present

## 2017-10-05 DIAGNOSIS — G894 Chronic pain syndrome: Secondary | ICD-10-CM | POA: Diagnosis not present

## 2017-10-05 DIAGNOSIS — R1031 Right lower quadrant pain: Secondary | ICD-10-CM | POA: Diagnosis not present

## 2017-10-05 DIAGNOSIS — R531 Weakness: Secondary | ICD-10-CM | POA: Diagnosis not present

## 2017-10-05 DIAGNOSIS — R0989 Other specified symptoms and signs involving the circulatory and respiratory systems: Secondary | ICD-10-CM | POA: Diagnosis not present

## 2017-10-05 DIAGNOSIS — M7989 Other specified soft tissue disorders: Secondary | ICD-10-CM | POA: Diagnosis not present

## 2017-10-05 DIAGNOSIS — R9341 Abnormal radiologic findings on diagnostic imaging of renal pelvis, ureter, or bladder: Secondary | ICD-10-CM | POA: Diagnosis not present

## 2017-10-05 DIAGNOSIS — Z6841 Body Mass Index (BMI) 40.0 and over, adult: Secondary | ICD-10-CM | POA: Diagnosis not present

## 2017-10-05 DIAGNOSIS — L03115 Cellulitis of right lower limb: Secondary | ICD-10-CM | POA: Diagnosis not present

## 2017-10-05 DIAGNOSIS — J929 Pleural plaque without asbestos: Secondary | ICD-10-CM | POA: Diagnosis not present

## 2017-10-05 DIAGNOSIS — M79605 Pain in left leg: Secondary | ICD-10-CM | POA: Diagnosis not present

## 2017-10-05 DIAGNOSIS — D3501 Benign neoplasm of right adrenal gland: Secondary | ICD-10-CM | POA: Diagnosis not present

## 2017-10-05 DIAGNOSIS — J984 Other disorders of lung: Secondary | ICD-10-CM | POA: Diagnosis not present

## 2017-10-05 DIAGNOSIS — K5730 Diverticulosis of large intestine without perforation or abscess without bleeding: Secondary | ICD-10-CM | POA: Diagnosis not present

## 2017-10-05 DIAGNOSIS — Z049 Encounter for examination and observation for unspecified reason: Secondary | ICD-10-CM | POA: Diagnosis not present

## 2017-10-05 DIAGNOSIS — J90 Pleural effusion, not elsewhere classified: Secondary | ICD-10-CM | POA: Diagnosis not present

## 2017-10-05 DIAGNOSIS — N281 Cyst of kidney, acquired: Secondary | ICD-10-CM | POA: Diagnosis not present

## 2017-10-06 DIAGNOSIS — N390 Urinary tract infection, site not specified: Secondary | ICD-10-CM | POA: Diagnosis not present

## 2017-10-06 DIAGNOSIS — N281 Cyst of kidney, acquired: Secondary | ICD-10-CM | POA: Diagnosis not present

## 2017-10-06 DIAGNOSIS — I5042 Chronic combined systolic (congestive) and diastolic (congestive) heart failure: Secondary | ICD-10-CM | POA: Diagnosis not present

## 2017-10-06 DIAGNOSIS — E039 Hypothyroidism, unspecified: Secondary | ICD-10-CM | POA: Diagnosis not present

## 2017-10-06 DIAGNOSIS — R918 Other nonspecific abnormal finding of lung field: Secondary | ICD-10-CM | POA: Diagnosis not present

## 2017-10-06 DIAGNOSIS — J929 Pleural plaque without asbestos: Secondary | ICD-10-CM | POA: Diagnosis not present

## 2017-10-06 DIAGNOSIS — D649 Anemia, unspecified: Secondary | ICD-10-CM | POA: Diagnosis not present

## 2017-10-06 DIAGNOSIS — Z85818 Personal history of malignant neoplasm of other sites of lip, oral cavity, and pharynx: Secondary | ICD-10-CM | POA: Diagnosis not present

## 2017-10-06 DIAGNOSIS — G4733 Obstructive sleep apnea (adult) (pediatric): Secondary | ICD-10-CM | POA: Diagnosis not present

## 2017-10-06 DIAGNOSIS — I110 Hypertensive heart disease with heart failure: Secondary | ICD-10-CM | POA: Diagnosis present

## 2017-10-06 DIAGNOSIS — L03119 Cellulitis of unspecified part of limb: Secondary | ICD-10-CM | POA: Diagnosis not present

## 2017-10-06 DIAGNOSIS — J9611 Chronic respiratory failure with hypoxia: Secondary | ICD-10-CM | POA: Diagnosis not present

## 2017-10-06 DIAGNOSIS — R0602 Shortness of breath: Secondary | ICD-10-CM | POA: Diagnosis not present

## 2017-10-06 DIAGNOSIS — Z6841 Body Mass Index (BMI) 40.0 and over, adult: Secondary | ICD-10-CM | POA: Diagnosis not present

## 2017-10-06 DIAGNOSIS — J90 Pleural effusion, not elsewhere classified: Secondary | ICD-10-CM | POA: Diagnosis not present

## 2017-10-06 DIAGNOSIS — K5730 Diverticulosis of large intestine without perforation or abscess without bleeding: Secondary | ICD-10-CM | POA: Diagnosis not present

## 2017-10-06 DIAGNOSIS — R269 Unspecified abnormalities of gait and mobility: Secondary | ICD-10-CM | POA: Diagnosis not present

## 2017-10-06 DIAGNOSIS — G609 Hereditary and idiopathic neuropathy, unspecified: Secondary | ICD-10-CM | POA: Diagnosis not present

## 2017-10-06 DIAGNOSIS — Z9981 Dependence on supplemental oxygen: Secondary | ICD-10-CM | POA: Diagnosis not present

## 2017-10-06 DIAGNOSIS — I10 Essential (primary) hypertension: Secondary | ICD-10-CM | POA: Diagnosis not present

## 2017-10-06 DIAGNOSIS — R278 Other lack of coordination: Secondary | ICD-10-CM | POA: Diagnosis not present

## 2017-10-06 DIAGNOSIS — I2510 Atherosclerotic heart disease of native coronary artery without angina pectoris: Secondary | ICD-10-CM | POA: Diagnosis not present

## 2017-10-06 DIAGNOSIS — J449 Chronic obstructive pulmonary disease, unspecified: Secondary | ICD-10-CM | POA: Diagnosis not present

## 2017-10-06 DIAGNOSIS — M6281 Muscle weakness (generalized): Secondary | ICD-10-CM | POA: Diagnosis not present

## 2017-10-06 DIAGNOSIS — E8770 Fluid overload, unspecified: Secondary | ICD-10-CM | POA: Diagnosis not present

## 2017-10-06 DIAGNOSIS — Z923 Personal history of irradiation: Secondary | ICD-10-CM | POA: Diagnosis not present

## 2017-10-06 DIAGNOSIS — G8929 Other chronic pain: Secondary | ICD-10-CM | POA: Diagnosis not present

## 2017-10-06 DIAGNOSIS — Z9884 Bariatric surgery status: Secondary | ICD-10-CM | POA: Diagnosis not present

## 2017-10-06 DIAGNOSIS — R0989 Other specified symptoms and signs involving the circulatory and respiratory systems: Secondary | ICD-10-CM | POA: Diagnosis not present

## 2017-10-06 DIAGNOSIS — M79605 Pain in left leg: Secondary | ICD-10-CM | POA: Diagnosis not present

## 2017-10-06 DIAGNOSIS — D3501 Benign neoplasm of right adrenal gland: Secondary | ICD-10-CM | POA: Diagnosis not present

## 2017-10-06 DIAGNOSIS — E876 Hypokalemia: Secondary | ICD-10-CM | POA: Diagnosis not present

## 2017-10-06 DIAGNOSIS — M1990 Unspecified osteoarthritis, unspecified site: Secondary | ICD-10-CM | POA: Diagnosis present

## 2017-10-06 DIAGNOSIS — R9341 Abnormal radiologic findings on diagnostic imaging of renal pelvis, ureter, or bladder: Secondary | ICD-10-CM | POA: Diagnosis not present

## 2017-10-06 DIAGNOSIS — M793 Panniculitis, unspecified: Secondary | ICD-10-CM | POA: Diagnosis not present

## 2017-10-06 DIAGNOSIS — I4891 Unspecified atrial fibrillation: Secondary | ICD-10-CM | POA: Diagnosis not present

## 2017-10-06 DIAGNOSIS — K5900 Constipation, unspecified: Secondary | ICD-10-CM | POA: Diagnosis present

## 2017-10-06 DIAGNOSIS — J159 Unspecified bacterial pneumonia: Secondary | ICD-10-CM | POA: Diagnosis not present

## 2017-10-06 DIAGNOSIS — I482 Chronic atrial fibrillation: Secondary | ICD-10-CM | POA: Diagnosis not present

## 2017-10-06 DIAGNOSIS — R600 Localized edema: Secondary | ICD-10-CM | POA: Diagnosis present

## 2017-10-06 DIAGNOSIS — F419 Anxiety disorder, unspecified: Secondary | ICD-10-CM | POA: Diagnosis not present

## 2017-10-06 DIAGNOSIS — Z23 Encounter for immunization: Secondary | ICD-10-CM | POA: Diagnosis not present

## 2017-10-06 DIAGNOSIS — L03115 Cellulitis of right lower limb: Secondary | ICD-10-CM | POA: Diagnosis present

## 2017-10-06 DIAGNOSIS — J984 Other disorders of lung: Secondary | ICD-10-CM | POA: Diagnosis not present

## 2017-10-06 DIAGNOSIS — K219 Gastro-esophageal reflux disease without esophagitis: Secondary | ICD-10-CM | POA: Diagnosis not present

## 2017-10-06 DIAGNOSIS — Z86711 Personal history of pulmonary embolism: Secondary | ICD-10-CM | POA: Diagnosis not present

## 2017-10-06 DIAGNOSIS — E785 Hyperlipidemia, unspecified: Secondary | ICD-10-CM | POA: Diagnosis present

## 2017-10-06 DIAGNOSIS — Z7901 Long term (current) use of anticoagulants: Secondary | ICD-10-CM | POA: Diagnosis not present

## 2017-10-06 DIAGNOSIS — I272 Pulmonary hypertension, unspecified: Secondary | ICD-10-CM | POA: Diagnosis not present

## 2017-10-06 DIAGNOSIS — I5040 Unspecified combined systolic (congestive) and diastolic (congestive) heart failure: Secondary | ICD-10-CM | POA: Diagnosis not present

## 2017-10-06 DIAGNOSIS — Z7401 Bed confinement status: Secondary | ICD-10-CM | POA: Diagnosis not present

## 2017-10-06 DIAGNOSIS — M7989 Other specified soft tissue disorders: Secondary | ICD-10-CM | POA: Diagnosis not present

## 2017-10-06 DIAGNOSIS — I429 Cardiomyopathy, unspecified: Secondary | ICD-10-CM | POA: Diagnosis present

## 2017-10-16 DIAGNOSIS — D649 Anemia, unspecified: Secondary | ICD-10-CM | POA: Diagnosis not present

## 2017-10-16 DIAGNOSIS — Z79899 Other long term (current) drug therapy: Secondary | ICD-10-CM | POA: Diagnosis not present

## 2017-10-16 DIAGNOSIS — R269 Unspecified abnormalities of gait and mobility: Secondary | ICD-10-CM | POA: Diagnosis not present

## 2017-10-16 DIAGNOSIS — Z23 Encounter for immunization: Secondary | ICD-10-CM | POA: Diagnosis not present

## 2017-10-16 DIAGNOSIS — R0989 Other specified symptoms and signs involving the circulatory and respiratory systems: Secondary | ICD-10-CM | POA: Diagnosis not present

## 2017-10-16 DIAGNOSIS — F331 Major depressive disorder, recurrent, moderate: Secondary | ICD-10-CM | POA: Diagnosis not present

## 2017-10-16 DIAGNOSIS — E785 Hyperlipidemia, unspecified: Secondary | ICD-10-CM | POA: Diagnosis present

## 2017-10-16 DIAGNOSIS — I481 Persistent atrial fibrillation: Secondary | ICD-10-CM | POA: Diagnosis not present

## 2017-10-16 DIAGNOSIS — R079 Chest pain, unspecified: Secondary | ICD-10-CM | POA: Diagnosis not present

## 2017-10-16 DIAGNOSIS — I2510 Atherosclerotic heart disease of native coronary artery without angina pectoris: Secondary | ICD-10-CM | POA: Diagnosis not present

## 2017-10-16 DIAGNOSIS — J9611 Chronic respiratory failure with hypoxia: Secondary | ICD-10-CM | POA: Diagnosis not present

## 2017-10-16 DIAGNOSIS — I482 Chronic atrial fibrillation: Secondary | ICD-10-CM | POA: Diagnosis not present

## 2017-10-16 DIAGNOSIS — F5101 Primary insomnia: Secondary | ICD-10-CM | POA: Diagnosis not present

## 2017-10-16 DIAGNOSIS — R0602 Shortness of breath: Secondary | ICD-10-CM | POA: Diagnosis not present

## 2017-10-16 DIAGNOSIS — T82524A Displacement of infusion catheter, initial encounter: Secondary | ICD-10-CM | POA: Diagnosis present

## 2017-10-16 DIAGNOSIS — F064 Anxiety disorder due to known physiological condition: Secondary | ICD-10-CM | POA: Diagnosis not present

## 2017-10-16 DIAGNOSIS — I5023 Acute on chronic systolic (congestive) heart failure: Secondary | ICD-10-CM | POA: Diagnosis not present

## 2017-10-16 DIAGNOSIS — E870 Hyperosmolality and hypernatremia: Secondary | ICD-10-CM | POA: Diagnosis present

## 2017-10-16 DIAGNOSIS — G609 Hereditary and idiopathic neuropathy, unspecified: Secondary | ICD-10-CM | POA: Diagnosis not present

## 2017-10-16 DIAGNOSIS — J90 Pleural effusion, not elsewhere classified: Secondary | ICD-10-CM | POA: Diagnosis not present

## 2017-10-16 DIAGNOSIS — N390 Urinary tract infection, site not specified: Secondary | ICD-10-CM | POA: Diagnosis not present

## 2017-10-16 DIAGNOSIS — R0603 Acute respiratory distress: Secondary | ICD-10-CM | POA: Diagnosis not present

## 2017-10-16 DIAGNOSIS — E8770 Fluid overload, unspecified: Secondary | ICD-10-CM | POA: Diagnosis not present

## 2017-10-16 DIAGNOSIS — J156 Pneumonia due to other aerobic Gram-negative bacteria: Secondary | ICD-10-CM | POA: Diagnosis present

## 2017-10-16 DIAGNOSIS — R918 Other nonspecific abnormal finding of lung field: Secondary | ICD-10-CM | POA: Diagnosis not present

## 2017-10-16 DIAGNOSIS — I10 Essential (primary) hypertension: Secondary | ICD-10-CM | POA: Diagnosis not present

## 2017-10-16 DIAGNOSIS — Z86711 Personal history of pulmonary embolism: Secondary | ICD-10-CM | POA: Diagnosis not present

## 2017-10-16 DIAGNOSIS — Z87891 Personal history of nicotine dependence: Secondary | ICD-10-CM | POA: Diagnosis not present

## 2017-10-16 DIAGNOSIS — J9622 Acute and chronic respiratory failure with hypercapnia: Secondary | ICD-10-CM | POA: Diagnosis present

## 2017-10-16 DIAGNOSIS — M6281 Muscle weakness (generalized): Secondary | ICD-10-CM | POA: Diagnosis not present

## 2017-10-16 DIAGNOSIS — Z7901 Long term (current) use of anticoagulants: Secondary | ICD-10-CM | POA: Diagnosis not present

## 2017-10-16 DIAGNOSIS — R7989 Other specified abnormal findings of blood chemistry: Secondary | ICD-10-CM | POA: Diagnosis not present

## 2017-10-16 DIAGNOSIS — I272 Pulmonary hypertension, unspecified: Secondary | ICD-10-CM | POA: Diagnosis not present

## 2017-10-16 DIAGNOSIS — J189 Pneumonia, unspecified organism: Secondary | ICD-10-CM | POA: Diagnosis not present

## 2017-10-16 DIAGNOSIS — R278 Other lack of coordination: Secondary | ICD-10-CM | POA: Diagnosis not present

## 2017-10-16 DIAGNOSIS — I4891 Unspecified atrial fibrillation: Secondary | ICD-10-CM | POA: Diagnosis not present

## 2017-10-16 DIAGNOSIS — J440 Chronic obstructive pulmonary disease with acute lower respiratory infection: Secondary | ICD-10-CM | POA: Diagnosis present

## 2017-10-16 DIAGNOSIS — Z7401 Bed confinement status: Secondary | ICD-10-CM | POA: Diagnosis not present

## 2017-10-16 DIAGNOSIS — M793 Panniculitis, unspecified: Secondary | ICD-10-CM | POA: Diagnosis not present

## 2017-10-16 DIAGNOSIS — L03115 Cellulitis of right lower limb: Secondary | ICD-10-CM | POA: Diagnosis not present

## 2017-10-16 DIAGNOSIS — J984 Other disorders of lung: Secondary | ICD-10-CM | POA: Diagnosis not present

## 2017-10-16 DIAGNOSIS — Z888 Allergy status to other drugs, medicaments and biological substances status: Secondary | ICD-10-CM | POA: Diagnosis not present

## 2017-10-16 DIAGNOSIS — G8929 Other chronic pain: Secondary | ICD-10-CM | POA: Diagnosis not present

## 2017-10-16 DIAGNOSIS — E039 Hypothyroidism, unspecified: Secondary | ICD-10-CM | POA: Diagnosis not present

## 2017-10-16 DIAGNOSIS — Z8589 Personal history of malignant neoplasm of other organs and systems: Secondary | ICD-10-CM | POA: Diagnosis not present

## 2017-10-16 DIAGNOSIS — Z6841 Body Mass Index (BMI) 40.0 and over, adult: Secondary | ICD-10-CM | POA: Diagnosis not present

## 2017-10-16 DIAGNOSIS — E876 Hypokalemia: Secondary | ICD-10-CM | POA: Diagnosis not present

## 2017-10-16 DIAGNOSIS — G4733 Obstructive sleep apnea (adult) (pediatric): Secondary | ICD-10-CM | POA: Diagnosis not present

## 2017-10-16 DIAGNOSIS — J811 Chronic pulmonary edema: Secondary | ICD-10-CM | POA: Diagnosis not present

## 2017-10-16 DIAGNOSIS — I110 Hypertensive heart disease with heart failure: Secondary | ICD-10-CM | POA: Diagnosis present

## 2017-10-16 DIAGNOSIS — F419 Anxiety disorder, unspecified: Secondary | ICD-10-CM | POA: Diagnosis not present

## 2017-10-16 DIAGNOSIS — R600 Localized edema: Secondary | ICD-10-CM | POA: Diagnosis not present

## 2017-10-16 DIAGNOSIS — J989 Respiratory disorder, unspecified: Secondary | ICD-10-CM | POA: Diagnosis not present

## 2017-10-16 DIAGNOSIS — Z923 Personal history of irradiation: Secondary | ICD-10-CM | POA: Diagnosis not present

## 2017-10-16 DIAGNOSIS — R5383 Other fatigue: Secondary | ICD-10-CM | POA: Diagnosis not present

## 2017-10-16 DIAGNOSIS — Z049 Encounter for examination and observation for unspecified reason: Secondary | ICD-10-CM | POA: Diagnosis not present

## 2017-10-16 DIAGNOSIS — J9621 Acute and chronic respiratory failure with hypoxia: Secondary | ICD-10-CM | POA: Diagnosis present

## 2017-10-16 DIAGNOSIS — J159 Unspecified bacterial pneumonia: Secondary | ICD-10-CM | POA: Diagnosis not present

## 2017-10-16 DIAGNOSIS — Z9049 Acquired absence of other specified parts of digestive tract: Secondary | ICD-10-CM | POA: Diagnosis not present

## 2017-10-16 DIAGNOSIS — L03119 Cellulitis of unspecified part of limb: Secondary | ICD-10-CM | POA: Diagnosis not present

## 2017-10-16 DIAGNOSIS — I5040 Unspecified combined systolic (congestive) and diastolic (congestive) heart failure: Secondary | ICD-10-CM | POA: Diagnosis not present

## 2017-10-16 DIAGNOSIS — K5900 Constipation, unspecified: Secondary | ICD-10-CM | POA: Diagnosis not present

## 2017-10-16 DIAGNOSIS — J449 Chronic obstructive pulmonary disease, unspecified: Secondary | ICD-10-CM | POA: Diagnosis not present

## 2017-10-16 DIAGNOSIS — I5042 Chronic combined systolic (congestive) and diastolic (congestive) heart failure: Secondary | ICD-10-CM | POA: Diagnosis not present

## 2017-10-16 DIAGNOSIS — K219 Gastro-esophageal reflux disease without esophagitis: Secondary | ICD-10-CM | POA: Diagnosis not present

## 2017-10-16 DIAGNOSIS — Z9981 Dependence on supplemental oxygen: Secondary | ICD-10-CM | POA: Diagnosis not present

## 2017-10-18 DIAGNOSIS — F419 Anxiety disorder, unspecified: Secondary | ICD-10-CM | POA: Diagnosis not present

## 2017-10-18 DIAGNOSIS — F331 Major depressive disorder, recurrent, moderate: Secondary | ICD-10-CM | POA: Diagnosis not present

## 2017-10-18 DIAGNOSIS — G609 Hereditary and idiopathic neuropathy, unspecified: Secondary | ICD-10-CM | POA: Diagnosis not present

## 2017-10-18 DIAGNOSIS — I2510 Atherosclerotic heart disease of native coronary artery without angina pectoris: Secondary | ICD-10-CM | POA: Diagnosis not present

## 2017-10-18 DIAGNOSIS — K5900 Constipation, unspecified: Secondary | ICD-10-CM | POA: Diagnosis not present

## 2017-10-18 DIAGNOSIS — I5042 Chronic combined systolic (congestive) and diastolic (congestive) heart failure: Secondary | ICD-10-CM | POA: Diagnosis not present

## 2017-10-18 DIAGNOSIS — M6281 Muscle weakness (generalized): Secondary | ICD-10-CM | POA: Diagnosis not present

## 2017-10-18 DIAGNOSIS — E039 Hypothyroidism, unspecified: Secondary | ICD-10-CM | POA: Diagnosis not present

## 2017-10-18 DIAGNOSIS — K219 Gastro-esophageal reflux disease without esophagitis: Secondary | ICD-10-CM | POA: Diagnosis not present

## 2017-10-18 DIAGNOSIS — G4733 Obstructive sleep apnea (adult) (pediatric): Secondary | ICD-10-CM | POA: Diagnosis not present

## 2017-10-18 DIAGNOSIS — G8929 Other chronic pain: Secondary | ICD-10-CM | POA: Diagnosis not present

## 2017-10-18 DIAGNOSIS — I481 Persistent atrial fibrillation: Secondary | ICD-10-CM | POA: Diagnosis not present

## 2017-10-18 MED ORDER — MAGNESIUM HYDROXIDE 400 MG/5ML PO SUSP
30.00 | ORAL | Status: DC
Start: ? — End: 2017-10-18

## 2017-10-18 MED ORDER — ESCITALOPRAM OXALATE 10 MG PO TABS
20.00 | ORAL_TABLET | ORAL | Status: DC
Start: 2017-10-17 — End: 2017-10-18

## 2017-10-18 MED ORDER — MAGNESIUM OXIDE 400 MG PO TABS
400.00 | ORAL_TABLET | ORAL | Status: DC
Start: 2017-10-17 — End: 2017-10-18

## 2017-10-18 MED ORDER — ALPRAZOLAM 1 MG PO TABS
1.00 | ORAL_TABLET | ORAL | Status: DC
Start: 2017-10-16 — End: 2017-10-18

## 2017-10-18 MED ORDER — MIRTAZAPINE 15 MG PO TBDP
30.00 | ORAL_TABLET | ORAL | Status: DC
Start: 2017-10-16 — End: 2017-10-18

## 2017-10-18 MED ORDER — ACETAMINOPHEN 650 MG RE SUPP
650.00 | RECTAL | Status: DC
Start: ? — End: 2017-10-18

## 2017-10-18 MED ORDER — VITAMIN D3 25 MCG (1000 UNIT) PO TABS
ORAL_TABLET | ORAL | Status: DC
Start: 2017-10-17 — End: 2017-10-18

## 2017-10-18 MED ORDER — METOPROLOL SUCCINATE ER 50 MG PO TB24
100.00 | ORAL_TABLET | ORAL | Status: DC
Start: 2017-10-17 — End: 2017-10-18

## 2017-10-18 MED ORDER — APIXABAN 2.5 MG PO TABS
5.00 | ORAL_TABLET | ORAL | Status: DC
Start: 2017-10-16 — End: 2017-10-18

## 2017-10-18 MED ORDER — GENERIC EXTERNAL MEDICATION
Status: DC
Start: 2017-10-17 — End: 2017-10-18

## 2017-10-18 MED ORDER — CEPHALEXIN 250 MG PO CAPS
500.00 | ORAL_CAPSULE | ORAL | Status: DC
Start: 2017-10-16 — End: 2017-10-18

## 2017-10-18 MED ORDER — ASPIRIN 81 MG PO CHEW
81.00 | CHEWABLE_TABLET | ORAL | Status: DC
Start: 2017-10-16 — End: 2017-10-18

## 2017-10-18 MED ORDER — ATORVASTATIN CALCIUM 20 MG PO TABS
20.00 | ORAL_TABLET | ORAL | Status: DC
Start: 2017-10-16 — End: 2017-10-18

## 2017-10-18 MED ORDER — OXYCODONE HCL 5 MG PO TABS
10.00 | ORAL_TABLET | ORAL | Status: DC
Start: ? — End: 2017-10-18

## 2017-10-18 MED ORDER — VITAMIN B-12 1000 MCG PO TABS
ORAL_TABLET | ORAL | Status: DC
Start: 2017-10-17 — End: 2017-10-18

## 2017-10-18 MED ORDER — ALBUTEROL SULFATE (2.5 MG/3ML) 0.083% IN NEBU
2.50 | INHALATION_SOLUTION | RESPIRATORY_TRACT | Status: DC
Start: ? — End: 2017-10-18

## 2017-10-18 MED ORDER — SENNA 8.6 MG PO TABS
ORAL_TABLET | ORAL | Status: DC
Start: 2017-10-17 — End: 2017-10-18

## 2017-10-18 MED ORDER — GENERIC EXTERNAL MEDICATION
Status: DC
Start: ? — End: 2017-10-18

## 2017-10-18 MED ORDER — SENNA-DOCUSATE SODIUM 8.6-50 MG PO TABS
ORAL_TABLET | ORAL | Status: DC
Start: ? — End: 2017-10-18

## 2017-10-18 MED ORDER — PANTOPRAZOLE SODIUM 40 MG PO TBEC
40.00 | DELAYED_RELEASE_TABLET | ORAL | Status: DC
Start: 2017-10-16 — End: 2017-10-18

## 2017-10-18 MED ORDER — ISOSORBIDE MONONITRATE ER 30 MG PO TB24
30.00 | ORAL_TABLET | ORAL | Status: DC
Start: 2017-10-17 — End: 2017-10-18

## 2017-10-18 MED ORDER — POTASSIUM CHLORIDE CRYS ER 20 MEQ PO TBCR
EXTENDED_RELEASE_TABLET | ORAL | Status: DC
Start: 2017-10-16 — End: 2017-10-18

## 2017-10-18 MED ORDER — HYDRALAZINE HCL 20 MG/ML IJ SOLN
10.00 | INTRAMUSCULAR | Status: DC
Start: ? — End: 2017-10-18

## 2017-10-18 MED ORDER — VITAMIN C 500 MG PO TABS
1000.00 | ORAL_TABLET | ORAL | Status: DC
Start: 2017-10-17 — End: 2017-10-18

## 2017-10-18 MED ORDER — LORATADINE 10 MG PO TABS
10.00 | ORAL_TABLET | ORAL | Status: DC
Start: 2017-10-17 — End: 2017-10-18

## 2017-10-18 MED ORDER — ACETAMINOPHEN 325 MG PO TABS
650.00 | ORAL_TABLET | ORAL | Status: DC
Start: ? — End: 2017-10-18

## 2017-10-18 MED ORDER — TRAZODONE HCL 50 MG PO TABS
50.00 | ORAL_TABLET | ORAL | Status: DC
Start: ? — End: 2017-10-18

## 2017-10-18 MED ORDER — PILOCARPINE HCL 5 MG PO TABS
5.00 | ORAL_TABLET | ORAL | Status: DC
Start: 2017-10-16 — End: 2017-10-18

## 2017-10-18 MED ORDER — GABAPENTIN 100 MG PO CAPS
100.00 | ORAL_CAPSULE | ORAL | Status: DC
Start: 2017-10-16 — End: 2017-10-18

## 2017-10-18 MED ORDER — CLOTRIMAZOLE-BETAMETHASONE 1-0.05 % EX CREA
TOPICAL_CREAM | CUTANEOUS | Status: DC
Start: 2017-10-16 — End: 2017-10-18

## 2017-10-18 MED ORDER — POLYETHYLENE GLYCOL 3350 17 G PO PACK
17.00 | PACK | ORAL | Status: DC
Start: 2017-10-16 — End: 2017-10-18

## 2017-10-18 MED ORDER — SODIUM CHLORIDE 0.9 % IV SOLN
INTRAVENOUS | Status: DC
Start: ? — End: 2017-10-18

## 2017-10-18 MED ORDER — BUMETANIDE 1 MG PO TABS
.50 | ORAL_TABLET | ORAL | Status: DC
Start: 2017-10-16 — End: 2017-10-18

## 2017-10-19 DIAGNOSIS — F331 Major depressive disorder, recurrent, moderate: Secondary | ICD-10-CM | POA: Diagnosis not present

## 2017-10-19 DIAGNOSIS — F419 Anxiety disorder, unspecified: Secondary | ICD-10-CM | POA: Diagnosis not present

## 2017-10-19 DIAGNOSIS — I481 Persistent atrial fibrillation: Secondary | ICD-10-CM | POA: Diagnosis not present

## 2017-10-19 DIAGNOSIS — G8929 Other chronic pain: Secondary | ICD-10-CM | POA: Diagnosis not present

## 2017-10-19 DIAGNOSIS — M6281 Muscle weakness (generalized): Secondary | ICD-10-CM | POA: Diagnosis not present

## 2017-10-19 DIAGNOSIS — I2510 Atherosclerotic heart disease of native coronary artery without angina pectoris: Secondary | ICD-10-CM | POA: Diagnosis not present

## 2017-10-19 DIAGNOSIS — K5900 Constipation, unspecified: Secondary | ICD-10-CM | POA: Diagnosis not present

## 2017-10-19 DIAGNOSIS — K219 Gastro-esophageal reflux disease without esophagitis: Secondary | ICD-10-CM | POA: Diagnosis not present

## 2017-10-19 DIAGNOSIS — G4733 Obstructive sleep apnea (adult) (pediatric): Secondary | ICD-10-CM | POA: Diagnosis not present

## 2017-10-19 DIAGNOSIS — G609 Hereditary and idiopathic neuropathy, unspecified: Secondary | ICD-10-CM | POA: Diagnosis not present

## 2017-10-19 DIAGNOSIS — E039 Hypothyroidism, unspecified: Secondary | ICD-10-CM | POA: Diagnosis not present

## 2017-10-19 DIAGNOSIS — I5042 Chronic combined systolic (congestive) and diastolic (congestive) heart failure: Secondary | ICD-10-CM | POA: Diagnosis not present

## 2017-10-22 DIAGNOSIS — G8929 Other chronic pain: Secondary | ICD-10-CM | POA: Diagnosis not present

## 2017-10-22 DIAGNOSIS — I481 Persistent atrial fibrillation: Secondary | ICD-10-CM | POA: Diagnosis not present

## 2017-10-22 DIAGNOSIS — F419 Anxiety disorder, unspecified: Secondary | ICD-10-CM | POA: Diagnosis not present

## 2017-10-22 DIAGNOSIS — I2510 Atherosclerotic heart disease of native coronary artery without angina pectoris: Secondary | ICD-10-CM | POA: Diagnosis not present

## 2017-10-22 DIAGNOSIS — E039 Hypothyroidism, unspecified: Secondary | ICD-10-CM | POA: Diagnosis not present

## 2017-10-22 DIAGNOSIS — I5042 Chronic combined systolic (congestive) and diastolic (congestive) heart failure: Secondary | ICD-10-CM | POA: Diagnosis not present

## 2017-10-22 DIAGNOSIS — K5900 Constipation, unspecified: Secondary | ICD-10-CM | POA: Diagnosis not present

## 2017-10-22 DIAGNOSIS — M6281 Muscle weakness (generalized): Secondary | ICD-10-CM | POA: Diagnosis not present

## 2017-10-22 DIAGNOSIS — G609 Hereditary and idiopathic neuropathy, unspecified: Secondary | ICD-10-CM | POA: Diagnosis not present

## 2017-10-22 DIAGNOSIS — K219 Gastro-esophageal reflux disease without esophagitis: Secondary | ICD-10-CM | POA: Diagnosis not present

## 2017-10-22 DIAGNOSIS — G4733 Obstructive sleep apnea (adult) (pediatric): Secondary | ICD-10-CM | POA: Diagnosis not present

## 2017-10-22 DIAGNOSIS — F331 Major depressive disorder, recurrent, moderate: Secondary | ICD-10-CM | POA: Diagnosis not present

## 2017-10-23 DIAGNOSIS — M6281 Muscle weakness (generalized): Secondary | ICD-10-CM | POA: Diagnosis not present

## 2017-10-23 DIAGNOSIS — G8929 Other chronic pain: Secondary | ICD-10-CM | POA: Diagnosis not present

## 2017-10-23 DIAGNOSIS — F331 Major depressive disorder, recurrent, moderate: Secondary | ICD-10-CM | POA: Diagnosis not present

## 2017-10-23 DIAGNOSIS — I5042 Chronic combined systolic (congestive) and diastolic (congestive) heart failure: Secondary | ICD-10-CM | POA: Diagnosis not present

## 2017-10-23 DIAGNOSIS — E039 Hypothyroidism, unspecified: Secondary | ICD-10-CM | POA: Diagnosis not present

## 2017-10-23 DIAGNOSIS — I2510 Atherosclerotic heart disease of native coronary artery without angina pectoris: Secondary | ICD-10-CM | POA: Diagnosis not present

## 2017-10-23 DIAGNOSIS — I481 Persistent atrial fibrillation: Secondary | ICD-10-CM | POA: Diagnosis not present

## 2017-10-23 DIAGNOSIS — K5900 Constipation, unspecified: Secondary | ICD-10-CM | POA: Diagnosis not present

## 2017-10-23 DIAGNOSIS — G609 Hereditary and idiopathic neuropathy, unspecified: Secondary | ICD-10-CM | POA: Diagnosis not present

## 2017-10-23 DIAGNOSIS — K219 Gastro-esophageal reflux disease without esophagitis: Secondary | ICD-10-CM | POA: Diagnosis not present

## 2017-10-23 DIAGNOSIS — G4733 Obstructive sleep apnea (adult) (pediatric): Secondary | ICD-10-CM | POA: Diagnosis not present

## 2017-10-23 DIAGNOSIS — F419 Anxiety disorder, unspecified: Secondary | ICD-10-CM | POA: Diagnosis not present

## 2017-10-24 DIAGNOSIS — Z86711 Personal history of pulmonary embolism: Secondary | ICD-10-CM | POA: Diagnosis not present

## 2017-10-24 DIAGNOSIS — R05 Cough: Secondary | ICD-10-CM | POA: Diagnosis not present

## 2017-10-24 DIAGNOSIS — R918 Other nonspecific abnormal finding of lung field: Secondary | ICD-10-CM | POA: Diagnosis not present

## 2017-10-24 DIAGNOSIS — Z6841 Body Mass Index (BMI) 40.0 and over, adult: Secondary | ICD-10-CM | POA: Diagnosis not present

## 2017-10-24 DIAGNOSIS — I272 Pulmonary hypertension, unspecified: Secondary | ICD-10-CM | POA: Diagnosis not present

## 2017-10-24 DIAGNOSIS — E876 Hypokalemia: Secondary | ICD-10-CM | POA: Diagnosis not present

## 2017-10-24 DIAGNOSIS — G609 Hereditary and idiopathic neuropathy, unspecified: Secondary | ICD-10-CM | POA: Diagnosis not present

## 2017-10-24 DIAGNOSIS — J159 Unspecified bacterial pneumonia: Secondary | ICD-10-CM | POA: Diagnosis not present

## 2017-10-24 DIAGNOSIS — T82524A Displacement of infusion catheter, initial encounter: Secondary | ICD-10-CM | POA: Diagnosis not present

## 2017-10-24 DIAGNOSIS — Z9049 Acquired absence of other specified parts of digestive tract: Secondary | ICD-10-CM | POA: Diagnosis not present

## 2017-10-24 DIAGNOSIS — E8770 Fluid overload, unspecified: Secondary | ICD-10-CM | POA: Diagnosis not present

## 2017-10-24 DIAGNOSIS — E870 Hyperosmolality and hypernatremia: Secondary | ICD-10-CM | POA: Diagnosis present

## 2017-10-24 DIAGNOSIS — M6281 Muscle weakness (generalized): Secondary | ICD-10-CM | POA: Diagnosis not present

## 2017-10-24 DIAGNOSIS — R0602 Shortness of breath: Secondary | ICD-10-CM | POA: Diagnosis not present

## 2017-10-24 DIAGNOSIS — N390 Urinary tract infection, site not specified: Secondary | ICD-10-CM | POA: Diagnosis not present

## 2017-10-24 DIAGNOSIS — R0989 Other specified symptoms and signs involving the circulatory and respiratory systems: Secondary | ICD-10-CM | POA: Diagnosis not present

## 2017-10-24 DIAGNOSIS — R600 Localized edema: Secondary | ICD-10-CM | POA: Diagnosis not present

## 2017-10-24 DIAGNOSIS — D649 Anemia, unspecified: Secondary | ICD-10-CM | POA: Diagnosis not present

## 2017-10-24 DIAGNOSIS — Z923 Personal history of irradiation: Secondary | ICD-10-CM | POA: Diagnosis not present

## 2017-10-24 DIAGNOSIS — J9611 Chronic respiratory failure with hypoxia: Secondary | ICD-10-CM | POA: Diagnosis not present

## 2017-10-24 DIAGNOSIS — J9621 Acute and chronic respiratory failure with hypoxia: Secondary | ICD-10-CM | POA: Diagnosis not present

## 2017-10-24 DIAGNOSIS — Z049 Encounter for examination and observation for unspecified reason: Secondary | ICD-10-CM | POA: Diagnosis not present

## 2017-10-24 DIAGNOSIS — F331 Major depressive disorder, recurrent, moderate: Secondary | ICD-10-CM | POA: Diagnosis not present

## 2017-10-24 DIAGNOSIS — Z9981 Dependence on supplemental oxygen: Secondary | ICD-10-CM | POA: Diagnosis not present

## 2017-10-24 DIAGNOSIS — J811 Chronic pulmonary edema: Secondary | ICD-10-CM | POA: Diagnosis not present

## 2017-10-24 DIAGNOSIS — J156 Pneumonia due to other aerobic Gram-negative bacteria: Secondary | ICD-10-CM | POA: Diagnosis not present

## 2017-10-24 DIAGNOSIS — Z87891 Personal history of nicotine dependence: Secondary | ICD-10-CM | POA: Diagnosis not present

## 2017-10-24 DIAGNOSIS — J440 Chronic obstructive pulmonary disease with acute lower respiratory infection: Secondary | ICD-10-CM | POA: Diagnosis present

## 2017-10-24 DIAGNOSIS — J449 Chronic obstructive pulmonary disease, unspecified: Secondary | ICD-10-CM | POA: Diagnosis not present

## 2017-10-24 DIAGNOSIS — R079 Chest pain, unspecified: Secondary | ICD-10-CM | POA: Diagnosis not present

## 2017-10-24 DIAGNOSIS — Z23 Encounter for immunization: Secondary | ICD-10-CM | POA: Diagnosis not present

## 2017-10-24 DIAGNOSIS — J189 Pneumonia, unspecified organism: Secondary | ICD-10-CM | POA: Diagnosis not present

## 2017-10-24 DIAGNOSIS — F419 Anxiety disorder, unspecified: Secondary | ICD-10-CM | POA: Diagnosis not present

## 2017-10-24 DIAGNOSIS — J90 Pleural effusion, not elsewhere classified: Secondary | ICD-10-CM | POA: Diagnosis not present

## 2017-10-24 DIAGNOSIS — G4733 Obstructive sleep apnea (adult) (pediatric): Secondary | ICD-10-CM | POA: Diagnosis not present

## 2017-10-24 DIAGNOSIS — G4700 Insomnia, unspecified: Secondary | ICD-10-CM | POA: Diagnosis not present

## 2017-10-24 DIAGNOSIS — J9622 Acute and chronic respiratory failure with hypercapnia: Secondary | ICD-10-CM | POA: Diagnosis not present

## 2017-10-24 DIAGNOSIS — I2510 Atherosclerotic heart disease of native coronary artery without angina pectoris: Secondary | ICD-10-CM | POA: Diagnosis not present

## 2017-10-24 DIAGNOSIS — I517 Cardiomegaly: Secondary | ICD-10-CM | POA: Diagnosis not present

## 2017-10-24 DIAGNOSIS — R0603 Acute respiratory distress: Secondary | ICD-10-CM | POA: Diagnosis not present

## 2017-10-24 DIAGNOSIS — I5040 Unspecified combined systolic (congestive) and diastolic (congestive) heart failure: Secondary | ICD-10-CM | POA: Diagnosis not present

## 2017-10-24 DIAGNOSIS — R269 Unspecified abnormalities of gait and mobility: Secondary | ICD-10-CM | POA: Diagnosis not present

## 2017-10-24 DIAGNOSIS — Z452 Encounter for adjustment and management of vascular access device: Secondary | ICD-10-CM | POA: Diagnosis not present

## 2017-10-24 DIAGNOSIS — Z79899 Other long term (current) drug therapy: Secondary | ICD-10-CM | POA: Diagnosis not present

## 2017-10-24 DIAGNOSIS — Z7401 Bed confinement status: Secondary | ICD-10-CM | POA: Diagnosis not present

## 2017-10-24 DIAGNOSIS — I5023 Acute on chronic systolic (congestive) heart failure: Secondary | ICD-10-CM | POA: Diagnosis not present

## 2017-10-24 DIAGNOSIS — J989 Respiratory disorder, unspecified: Secondary | ICD-10-CM | POA: Diagnosis not present

## 2017-10-24 DIAGNOSIS — R278 Other lack of coordination: Secondary | ICD-10-CM | POA: Diagnosis not present

## 2017-10-24 DIAGNOSIS — E039 Hypothyroidism, unspecified: Secondary | ICD-10-CM | POA: Diagnosis not present

## 2017-10-24 DIAGNOSIS — I110 Hypertensive heart disease with heart failure: Secondary | ICD-10-CM | POA: Diagnosis not present

## 2017-10-24 DIAGNOSIS — M793 Panniculitis, unspecified: Secondary | ICD-10-CM | POA: Diagnosis not present

## 2017-10-24 DIAGNOSIS — Z8589 Personal history of malignant neoplasm of other organs and systems: Secondary | ICD-10-CM | POA: Diagnosis not present

## 2017-10-24 DIAGNOSIS — E785 Hyperlipidemia, unspecified: Secondary | ICD-10-CM | POA: Diagnosis not present

## 2017-10-24 DIAGNOSIS — I10 Essential (primary) hypertension: Secondary | ICD-10-CM | POA: Diagnosis not present

## 2017-10-24 DIAGNOSIS — R5383 Other fatigue: Secondary | ICD-10-CM | POA: Diagnosis not present

## 2017-10-24 DIAGNOSIS — J984 Other disorders of lung: Secondary | ICD-10-CM | POA: Diagnosis not present

## 2017-10-24 DIAGNOSIS — R1312 Dysphagia, oropharyngeal phase: Secondary | ICD-10-CM | POA: Diagnosis not present

## 2017-10-24 DIAGNOSIS — G8929 Other chronic pain: Secondary | ICD-10-CM | POA: Diagnosis not present

## 2017-10-24 DIAGNOSIS — R7989 Other specified abnormal findings of blood chemistry: Secondary | ICD-10-CM | POA: Diagnosis not present

## 2017-10-24 DIAGNOSIS — Z888 Allergy status to other drugs, medicaments and biological substances status: Secondary | ICD-10-CM | POA: Diagnosis not present

## 2017-10-24 DIAGNOSIS — K219 Gastro-esophageal reflux disease without esophagitis: Secondary | ICD-10-CM | POA: Diagnosis not present

## 2017-10-24 DIAGNOSIS — L03119 Cellulitis of unspecified part of limb: Secondary | ICD-10-CM | POA: Diagnosis not present

## 2017-10-31 DIAGNOSIS — L03119 Cellulitis of unspecified part of limb: Secondary | ICD-10-CM | POA: Diagnosis not present

## 2017-10-31 DIAGNOSIS — R278 Other lack of coordination: Secondary | ICD-10-CM | POA: Diagnosis not present

## 2017-10-31 DIAGNOSIS — R1312 Dysphagia, oropharyngeal phase: Secondary | ICD-10-CM | POA: Diagnosis not present

## 2017-10-31 DIAGNOSIS — I481 Persistent atrial fibrillation: Secondary | ICD-10-CM | POA: Diagnosis not present

## 2017-10-31 DIAGNOSIS — Z6841 Body Mass Index (BMI) 40.0 and over, adult: Secondary | ICD-10-CM | POA: Diagnosis not present

## 2017-10-31 DIAGNOSIS — Z23 Encounter for immunization: Secondary | ICD-10-CM | POA: Diagnosis not present

## 2017-10-31 DIAGNOSIS — I4891 Unspecified atrial fibrillation: Secondary | ICD-10-CM | POA: Diagnosis present

## 2017-10-31 DIAGNOSIS — R0602 Shortness of breath: Secondary | ICD-10-CM | POA: Diagnosis not present

## 2017-10-31 DIAGNOSIS — B348 Other viral infections of unspecified site: Secondary | ICD-10-CM | POA: Diagnosis present

## 2017-10-31 DIAGNOSIS — E8770 Fluid overload, unspecified: Secondary | ICD-10-CM | POA: Diagnosis not present

## 2017-10-31 DIAGNOSIS — K209 Esophagitis, unspecified: Secondary | ICD-10-CM | POA: Diagnosis present

## 2017-10-31 DIAGNOSIS — I5023 Acute on chronic systolic (congestive) heart failure: Secondary | ICD-10-CM | POA: Diagnosis not present

## 2017-10-31 DIAGNOSIS — G4733 Obstructive sleep apnea (adult) (pediatric): Secondary | ICD-10-CM | POA: Diagnosis not present

## 2017-10-31 DIAGNOSIS — J449 Chronic obstructive pulmonary disease, unspecified: Secondary | ICD-10-CM | POA: Diagnosis not present

## 2017-10-31 DIAGNOSIS — R0603 Acute respiratory distress: Secondary | ICD-10-CM | POA: Diagnosis not present

## 2017-10-31 DIAGNOSIS — M793 Panniculitis, unspecified: Secondary | ICD-10-CM | POA: Diagnosis not present

## 2017-10-31 DIAGNOSIS — I5043 Acute on chronic combined systolic (congestive) and diastolic (congestive) heart failure: Secondary | ICD-10-CM | POA: Diagnosis present

## 2017-10-31 DIAGNOSIS — F331 Major depressive disorder, recurrent, moderate: Secondary | ICD-10-CM | POA: Diagnosis not present

## 2017-10-31 DIAGNOSIS — G609 Hereditary and idiopathic neuropathy, unspecified: Secondary | ICD-10-CM | POA: Diagnosis not present

## 2017-10-31 DIAGNOSIS — M1990 Unspecified osteoarthritis, unspecified site: Secondary | ICD-10-CM | POA: Diagnosis present

## 2017-10-31 DIAGNOSIS — J189 Pneumonia, unspecified organism: Secondary | ICD-10-CM | POA: Diagnosis not present

## 2017-10-31 DIAGNOSIS — E039 Hypothyroidism, unspecified: Secondary | ICD-10-CM | POA: Diagnosis present

## 2017-10-31 DIAGNOSIS — Z049 Encounter for examination and observation for unspecified reason: Secondary | ICD-10-CM | POA: Diagnosis not present

## 2017-10-31 DIAGNOSIS — N390 Urinary tract infection, site not specified: Secondary | ICD-10-CM | POA: Diagnosis not present

## 2017-10-31 DIAGNOSIS — K219 Gastro-esophageal reflux disease without esophagitis: Secondary | ICD-10-CM | POA: Diagnosis not present

## 2017-10-31 DIAGNOSIS — I509 Heart failure, unspecified: Secondary | ICD-10-CM | POA: Diagnosis not present

## 2017-10-31 DIAGNOSIS — G4700 Insomnia, unspecified: Secondary | ICD-10-CM | POA: Diagnosis not present

## 2017-10-31 DIAGNOSIS — Z9981 Dependence on supplemental oxygen: Secondary | ICD-10-CM | POA: Diagnosis not present

## 2017-10-31 DIAGNOSIS — G8929 Other chronic pain: Secondary | ICD-10-CM | POA: Diagnosis not present

## 2017-10-31 DIAGNOSIS — F40240 Claustrophobia: Secondary | ICD-10-CM | POA: Diagnosis present

## 2017-10-31 DIAGNOSIS — I2510 Atherosclerotic heart disease of native coronary artery without angina pectoris: Secondary | ICD-10-CM | POA: Diagnosis not present

## 2017-10-31 DIAGNOSIS — M6281 Muscle weakness (generalized): Secondary | ICD-10-CM | POA: Diagnosis not present

## 2017-10-31 DIAGNOSIS — E870 Hyperosmolality and hypernatremia: Secondary | ICD-10-CM | POA: Diagnosis present

## 2017-10-31 DIAGNOSIS — I5042 Chronic combined systolic (congestive) and diastolic (congestive) heart failure: Secondary | ICD-10-CM | POA: Diagnosis not present

## 2017-10-31 DIAGNOSIS — J989 Respiratory disorder, unspecified: Secondary | ICD-10-CM | POA: Diagnosis not present

## 2017-10-31 DIAGNOSIS — K909 Intestinal malabsorption, unspecified: Secondary | ICD-10-CM | POA: Diagnosis present

## 2017-10-31 DIAGNOSIS — D649 Anemia, unspecified: Secondary | ICD-10-CM | POA: Diagnosis not present

## 2017-10-31 DIAGNOSIS — J9622 Acute and chronic respiratory failure with hypercapnia: Secondary | ICD-10-CM | POA: Diagnosis not present

## 2017-10-31 DIAGNOSIS — E785 Hyperlipidemia, unspecified: Secondary | ICD-10-CM | POA: Diagnosis not present

## 2017-10-31 DIAGNOSIS — J9602 Acute respiratory failure with hypercapnia: Secondary | ICD-10-CM | POA: Diagnosis present

## 2017-10-31 DIAGNOSIS — R0600 Dyspnea, unspecified: Secondary | ICD-10-CM | POA: Diagnosis not present

## 2017-10-31 DIAGNOSIS — K5900 Constipation, unspecified: Secondary | ICD-10-CM | POA: Diagnosis not present

## 2017-10-31 DIAGNOSIS — I272 Pulmonary hypertension, unspecified: Secondary | ICD-10-CM | POA: Diagnosis not present

## 2017-10-31 DIAGNOSIS — I5040 Unspecified combined systolic (congestive) and diastolic (congestive) heart failure: Secondary | ICD-10-CM | POA: Diagnosis not present

## 2017-10-31 DIAGNOSIS — I10 Essential (primary) hypertension: Secondary | ICD-10-CM | POA: Diagnosis not present

## 2017-10-31 DIAGNOSIS — F419 Anxiety disorder, unspecified: Secondary | ICD-10-CM | POA: Diagnosis not present

## 2017-10-31 DIAGNOSIS — R918 Other nonspecific abnormal finding of lung field: Secondary | ICD-10-CM | POA: Diagnosis not present

## 2017-10-31 DIAGNOSIS — J9601 Acute respiratory failure with hypoxia: Secondary | ICD-10-CM | POA: Diagnosis present

## 2017-10-31 DIAGNOSIS — R269 Unspecified abnormalities of gait and mobility: Secondary | ICD-10-CM | POA: Diagnosis not present

## 2017-10-31 DIAGNOSIS — E876 Hypokalemia: Secondary | ICD-10-CM | POA: Diagnosis not present

## 2017-10-31 DIAGNOSIS — J9621 Acute and chronic respiratory failure with hypoxia: Secondary | ICD-10-CM | POA: Diagnosis not present

## 2017-10-31 DIAGNOSIS — J159 Unspecified bacterial pneumonia: Secondary | ICD-10-CM | POA: Diagnosis not present

## 2017-10-31 DIAGNOSIS — I110 Hypertensive heart disease with heart failure: Secondary | ICD-10-CM | POA: Diagnosis present

## 2017-10-31 DIAGNOSIS — D509 Iron deficiency anemia, unspecified: Secondary | ICD-10-CM | POA: Diagnosis present

## 2017-10-31 DIAGNOSIS — J90 Pleural effusion, not elsewhere classified: Secondary | ICD-10-CM | POA: Diagnosis not present

## 2017-10-31 DIAGNOSIS — I517 Cardiomegaly: Secondary | ICD-10-CM | POA: Diagnosis not present

## 2017-10-31 DIAGNOSIS — I471 Supraventricular tachycardia: Secondary | ICD-10-CM | POA: Diagnosis present

## 2017-10-31 DIAGNOSIS — J9611 Chronic respiratory failure with hypoxia: Secondary | ICD-10-CM | POA: Diagnosis not present

## 2017-11-02 DIAGNOSIS — I481 Persistent atrial fibrillation: Secondary | ICD-10-CM | POA: Diagnosis not present

## 2017-11-02 DIAGNOSIS — I5042 Chronic combined systolic (congestive) and diastolic (congestive) heart failure: Secondary | ICD-10-CM | POA: Diagnosis not present

## 2017-11-02 DIAGNOSIS — G8929 Other chronic pain: Secondary | ICD-10-CM | POA: Diagnosis not present

## 2017-11-02 DIAGNOSIS — I2510 Atherosclerotic heart disease of native coronary artery without angina pectoris: Secondary | ICD-10-CM | POA: Diagnosis not present

## 2017-11-02 DIAGNOSIS — K219 Gastro-esophageal reflux disease without esophagitis: Secondary | ICD-10-CM | POA: Diagnosis not present

## 2017-11-02 DIAGNOSIS — F331 Major depressive disorder, recurrent, moderate: Secondary | ICD-10-CM | POA: Diagnosis not present

## 2017-11-02 DIAGNOSIS — G4733 Obstructive sleep apnea (adult) (pediatric): Secondary | ICD-10-CM | POA: Diagnosis not present

## 2017-11-02 DIAGNOSIS — E039 Hypothyroidism, unspecified: Secondary | ICD-10-CM | POA: Diagnosis not present

## 2017-11-02 DIAGNOSIS — K5900 Constipation, unspecified: Secondary | ICD-10-CM | POA: Diagnosis not present

## 2017-11-02 DIAGNOSIS — F419 Anxiety disorder, unspecified: Secondary | ICD-10-CM | POA: Diagnosis not present

## 2017-11-02 DIAGNOSIS — G609 Hereditary and idiopathic neuropathy, unspecified: Secondary | ICD-10-CM | POA: Diagnosis not present

## 2017-11-02 DIAGNOSIS — M6281 Muscle weakness (generalized): Secondary | ICD-10-CM | POA: Diagnosis not present

## 2017-11-02 MED ORDER — BUMETANIDE 1 MG PO TABS
0.50 | ORAL_TABLET | ORAL | Status: DC
Start: 2017-11-01 — End: 2017-11-02

## 2017-11-02 MED ORDER — ATORVASTATIN CALCIUM 20 MG PO TABS
20.00 | ORAL_TABLET | ORAL | Status: DC
Start: 2017-10-31 — End: 2017-11-02

## 2017-11-02 MED ORDER — ESCITALOPRAM OXALATE 10 MG PO TABS
20.00 | ORAL_TABLET | ORAL | Status: DC
Start: 2017-11-01 — End: 2017-11-02

## 2017-11-02 MED ORDER — MAGNESIUM OXIDE 400 MG PO TABS
400.00 | ORAL_TABLET | ORAL | Status: DC
Start: 2017-11-01 — End: 2017-11-02

## 2017-11-02 MED ORDER — APIXABAN 5 MG PO TABS
5.00 | ORAL_TABLET | ORAL | Status: DC
Start: 2017-10-31 — End: 2017-11-02

## 2017-11-02 MED ORDER — SENNA-DOCUSATE SODIUM 8.6-50 MG PO TABS
ORAL_TABLET | ORAL | Status: DC
Start: 2017-10-31 — End: 2017-11-02

## 2017-11-02 MED ORDER — ASPIRIN 81 MG PO CHEW
81.00 | CHEWABLE_TABLET | ORAL | Status: DC
Start: 2017-10-31 — End: 2017-11-02

## 2017-11-02 MED ORDER — TRAZODONE HCL 50 MG PO TABS
50.00 | ORAL_TABLET | ORAL | Status: DC
Start: ? — End: 2017-11-02

## 2017-11-02 MED ORDER — PILOCARPINE HCL 5 MG PO TABS
5.00 | ORAL_TABLET | ORAL | Status: DC
Start: 2017-10-31 — End: 2017-11-02

## 2017-11-02 MED ORDER — THERA PO TABS
ORAL_TABLET | ORAL | Status: DC
Start: 2017-11-01 — End: 2017-11-02

## 2017-11-02 MED ORDER — GENERIC EXTERNAL MEDICATION
Status: DC
Start: 2017-11-01 — End: 2017-11-02

## 2017-11-02 MED ORDER — CLOTRIMAZOLE 1 % EX CREA
TOPICAL_CREAM | CUTANEOUS | Status: DC
Start: 2017-10-31 — End: 2017-11-02

## 2017-11-02 MED ORDER — MIRTAZAPINE 15 MG PO TBDP
30.00 | ORAL_TABLET | ORAL | Status: DC
Start: 2017-10-31 — End: 2017-11-02

## 2017-11-02 MED ORDER — PANTOPRAZOLE SODIUM 40 MG PO TBEC
40.00 | DELAYED_RELEASE_TABLET | ORAL | Status: DC
Start: 2017-10-31 — End: 2017-11-02

## 2017-11-02 MED ORDER — ALPRAZOLAM 1 MG PO TABS
1.00 | ORAL_TABLET | ORAL | Status: DC
Start: 2017-10-31 — End: 2017-11-02

## 2017-11-02 MED ORDER — ONDANSETRON HCL 4 MG PO TABS
4.00 | ORAL_TABLET | ORAL | Status: DC
Start: ? — End: 2017-11-02

## 2017-11-02 MED ORDER — LORATADINE 10 MG PO TABS
20.00 | ORAL_TABLET | ORAL | Status: DC
Start: 2017-11-01 — End: 2017-11-02

## 2017-11-02 MED ORDER — ACETAZOLAMIDE 250 MG PO TABS
250.00 | ORAL_TABLET | ORAL | Status: DC
Start: 2017-11-01 — End: 2017-11-02

## 2017-11-02 MED ORDER — LIDOCAINE HCL 2 % IJ SOLN
10.00 | INTRAMUSCULAR | Status: DC
Start: ? — End: 2017-11-02

## 2017-11-02 MED ORDER — ACETAMINOPHEN 325 MG PO TABS
650.00 | ORAL_TABLET | ORAL | Status: DC
Start: ? — End: 2017-11-02

## 2017-11-02 MED ORDER — LEVOFLOXACIN 750 MG PO TABS
750.00 | ORAL_TABLET | ORAL | Status: DC
Start: 2017-11-01 — End: 2017-11-02

## 2017-11-02 MED ORDER — SODIUM CHLORIDE 0.9 % IV SOLN
INTRAVENOUS | Status: DC
Start: ? — End: 2017-11-02

## 2017-11-02 MED ORDER — OXYCODONE HCL 5 MG PO TABS
5.00 | ORAL_TABLET | ORAL | Status: DC
Start: ? — End: 2017-11-02

## 2017-11-02 MED ORDER — GENERIC EXTERNAL MEDICATION
Status: DC
Start: ? — End: 2017-11-02

## 2017-11-02 MED ORDER — ISOSORBIDE MONONITRATE ER 30 MG PO TB24
30.00 | ORAL_TABLET | ORAL | Status: DC
Start: 2017-11-01 — End: 2017-11-02

## 2017-11-02 MED ORDER — METOPROLOL SUCCINATE ER 100 MG PO TB24
100.00 | ORAL_TABLET | ORAL | Status: DC
Start: 2017-11-01 — End: 2017-11-02

## 2017-11-02 MED ORDER — VITAMIN B-12 1000 MCG PO TABS
ORAL_TABLET | ORAL | Status: DC
Start: 2017-11-01 — End: 2017-11-02

## 2017-11-02 MED ORDER — GABAPENTIN 100 MG PO CAPS
100.00 | ORAL_CAPSULE | ORAL | Status: DC
Start: 2017-10-31 — End: 2017-11-02

## 2017-11-05 DIAGNOSIS — I2510 Atherosclerotic heart disease of native coronary artery without angina pectoris: Secondary | ICD-10-CM | POA: Diagnosis not present

## 2017-11-05 DIAGNOSIS — G4733 Obstructive sleep apnea (adult) (pediatric): Secondary | ICD-10-CM | POA: Diagnosis not present

## 2017-11-05 DIAGNOSIS — I481 Persistent atrial fibrillation: Secondary | ICD-10-CM | POA: Diagnosis not present

## 2017-11-05 DIAGNOSIS — I5042 Chronic combined systolic (congestive) and diastolic (congestive) heart failure: Secondary | ICD-10-CM | POA: Diagnosis not present

## 2017-11-05 DIAGNOSIS — G8929 Other chronic pain: Secondary | ICD-10-CM | POA: Diagnosis not present

## 2017-11-05 DIAGNOSIS — K5900 Constipation, unspecified: Secondary | ICD-10-CM | POA: Diagnosis not present

## 2017-11-05 DIAGNOSIS — K219 Gastro-esophageal reflux disease without esophagitis: Secondary | ICD-10-CM | POA: Diagnosis not present

## 2017-11-05 DIAGNOSIS — G609 Hereditary and idiopathic neuropathy, unspecified: Secondary | ICD-10-CM | POA: Diagnosis not present

## 2017-11-05 DIAGNOSIS — E039 Hypothyroidism, unspecified: Secondary | ICD-10-CM | POA: Diagnosis not present

## 2017-11-05 DIAGNOSIS — F419 Anxiety disorder, unspecified: Secondary | ICD-10-CM | POA: Diagnosis not present

## 2017-11-05 DIAGNOSIS — M6281 Muscle weakness (generalized): Secondary | ICD-10-CM | POA: Diagnosis not present

## 2017-11-05 DIAGNOSIS — F331 Major depressive disorder, recurrent, moderate: Secondary | ICD-10-CM | POA: Diagnosis not present

## 2017-11-06 DIAGNOSIS — J449 Chronic obstructive pulmonary disease, unspecified: Secondary | ICD-10-CM | POA: Diagnosis not present

## 2017-11-06 DIAGNOSIS — G609 Hereditary and idiopathic neuropathy, unspecified: Secondary | ICD-10-CM | POA: Diagnosis not present

## 2017-11-06 DIAGNOSIS — D509 Iron deficiency anemia, unspecified: Secondary | ICD-10-CM | POA: Diagnosis present

## 2017-11-06 DIAGNOSIS — I509 Heart failure, unspecified: Secondary | ICD-10-CM | POA: Diagnosis not present

## 2017-11-06 DIAGNOSIS — I4891 Unspecified atrial fibrillation: Secondary | ICD-10-CM | POA: Diagnosis present

## 2017-11-06 DIAGNOSIS — Z6841 Body Mass Index (BMI) 40.0 and over, adult: Secondary | ICD-10-CM | POA: Diagnosis not present

## 2017-11-06 DIAGNOSIS — J159 Unspecified bacterial pneumonia: Secondary | ICD-10-CM | POA: Diagnosis not present

## 2017-11-06 DIAGNOSIS — I5042 Chronic combined systolic (congestive) and diastolic (congestive) heart failure: Secondary | ICD-10-CM | POA: Diagnosis not present

## 2017-11-06 DIAGNOSIS — J189 Pneumonia, unspecified organism: Secondary | ICD-10-CM | POA: Diagnosis not present

## 2017-11-06 DIAGNOSIS — I110 Hypertensive heart disease with heart failure: Secondary | ICD-10-CM | POA: Diagnosis present

## 2017-11-06 DIAGNOSIS — R0600 Dyspnea, unspecified: Secondary | ICD-10-CM | POA: Diagnosis not present

## 2017-11-06 DIAGNOSIS — J9602 Acute respiratory failure with hypercapnia: Secondary | ICD-10-CM | POA: Diagnosis not present

## 2017-11-06 DIAGNOSIS — I5023 Acute on chronic systolic (congestive) heart failure: Secondary | ICD-10-CM | POA: Diagnosis not present

## 2017-11-06 DIAGNOSIS — L03119 Cellulitis of unspecified part of limb: Secondary | ICD-10-CM | POA: Diagnosis not present

## 2017-11-06 DIAGNOSIS — Z7409 Other reduced mobility: Secondary | ICD-10-CM | POA: Diagnosis not present

## 2017-11-06 DIAGNOSIS — N390 Urinary tract infection, site not specified: Secondary | ICD-10-CM | POA: Diagnosis not present

## 2017-11-06 DIAGNOSIS — J989 Respiratory disorder, unspecified: Secondary | ICD-10-CM | POA: Diagnosis not present

## 2017-11-06 DIAGNOSIS — I517 Cardiomegaly: Secondary | ICD-10-CM | POA: Diagnosis not present

## 2017-11-06 DIAGNOSIS — Z23 Encounter for immunization: Secondary | ICD-10-CM | POA: Diagnosis not present

## 2017-11-06 DIAGNOSIS — R278 Other lack of coordination: Secondary | ICD-10-CM | POA: Diagnosis not present

## 2017-11-06 DIAGNOSIS — I5043 Acute on chronic combined systolic (congestive) and diastolic (congestive) heart failure: Secondary | ICD-10-CM | POA: Diagnosis present

## 2017-11-06 DIAGNOSIS — E8770 Fluid overload, unspecified: Secondary | ICD-10-CM | POA: Diagnosis not present

## 2017-11-06 DIAGNOSIS — E870 Hyperosmolality and hypernatremia: Secondary | ICD-10-CM | POA: Diagnosis not present

## 2017-11-06 DIAGNOSIS — F419 Anxiety disorder, unspecified: Secondary | ICD-10-CM | POA: Diagnosis present

## 2017-11-06 DIAGNOSIS — J90 Pleural effusion, not elsewhere classified: Secondary | ICD-10-CM | POA: Diagnosis not present

## 2017-11-06 DIAGNOSIS — J9601 Acute respiratory failure with hypoxia: Secondary | ICD-10-CM | POA: Diagnosis present

## 2017-11-06 DIAGNOSIS — I5040 Unspecified combined systolic (congestive) and diastolic (congestive) heart failure: Secondary | ICD-10-CM | POA: Diagnosis not present

## 2017-11-06 DIAGNOSIS — E039 Hypothyroidism, unspecified: Secondary | ICD-10-CM | POA: Diagnosis present

## 2017-11-06 DIAGNOSIS — B348 Other viral infections of unspecified site: Secondary | ICD-10-CM | POA: Diagnosis present

## 2017-11-06 DIAGNOSIS — G8929 Other chronic pain: Secondary | ICD-10-CM | POA: Diagnosis not present

## 2017-11-06 DIAGNOSIS — J9622 Acute and chronic respiratory failure with hypercapnia: Secondary | ICD-10-CM | POA: Diagnosis not present

## 2017-11-06 DIAGNOSIS — K219 Gastro-esophageal reflux disease without esophagitis: Secondary | ICD-10-CM | POA: Diagnosis present

## 2017-11-06 DIAGNOSIS — I471 Supraventricular tachycardia: Secondary | ICD-10-CM | POA: Diagnosis not present

## 2017-11-06 DIAGNOSIS — Z049 Encounter for examination and observation for unspecified reason: Secondary | ICD-10-CM | POA: Diagnosis not present

## 2017-11-06 DIAGNOSIS — E785 Hyperlipidemia, unspecified: Secondary | ICD-10-CM | POA: Diagnosis present

## 2017-11-06 DIAGNOSIS — K909 Intestinal malabsorption, unspecified: Secondary | ICD-10-CM | POA: Diagnosis present

## 2017-11-06 DIAGNOSIS — J811 Chronic pulmonary edema: Secondary | ICD-10-CM | POA: Diagnosis not present

## 2017-11-06 DIAGNOSIS — M1990 Unspecified osteoarthritis, unspecified site: Secondary | ICD-10-CM | POA: Diagnosis present

## 2017-11-06 DIAGNOSIS — D649 Anemia, unspecified: Secondary | ICD-10-CM | POA: Diagnosis not present

## 2017-11-06 DIAGNOSIS — I2510 Atherosclerotic heart disease of native coronary artery without angina pectoris: Secondary | ICD-10-CM | POA: Diagnosis present

## 2017-11-06 DIAGNOSIS — R269 Unspecified abnormalities of gait and mobility: Secondary | ICD-10-CM | POA: Diagnosis not present

## 2017-11-06 DIAGNOSIS — M6281 Muscle weakness (generalized): Secondary | ICD-10-CM | POA: Diagnosis not present

## 2017-11-06 DIAGNOSIS — R1312 Dysphagia, oropharyngeal phase: Secondary | ICD-10-CM | POA: Diagnosis not present

## 2017-11-06 DIAGNOSIS — K209 Esophagitis, unspecified: Secondary | ICD-10-CM | POA: Diagnosis present

## 2017-11-06 DIAGNOSIS — I10 Essential (primary) hypertension: Secondary | ICD-10-CM | POA: Diagnosis not present

## 2017-11-06 DIAGNOSIS — R918 Other nonspecific abnormal finding of lung field: Secondary | ICD-10-CM | POA: Diagnosis not present

## 2017-11-06 DIAGNOSIS — F40240 Claustrophobia: Secondary | ICD-10-CM | POA: Diagnosis present

## 2017-11-06 DIAGNOSIS — E876 Hypokalemia: Secondary | ICD-10-CM | POA: Diagnosis present

## 2017-11-06 DIAGNOSIS — Z452 Encounter for adjustment and management of vascular access device: Secondary | ICD-10-CM | POA: Diagnosis not present

## 2017-11-06 DIAGNOSIS — J9611 Chronic respiratory failure with hypoxia: Secondary | ICD-10-CM | POA: Diagnosis not present

## 2017-11-06 DIAGNOSIS — R0602 Shortness of breath: Secondary | ICD-10-CM | POA: Diagnosis not present

## 2017-11-06 DIAGNOSIS — M793 Panniculitis, unspecified: Secondary | ICD-10-CM | POA: Diagnosis not present

## 2017-11-06 DIAGNOSIS — G4733 Obstructive sleep apnea (adult) (pediatric): Secondary | ICD-10-CM | POA: Diagnosis not present

## 2017-11-16 DIAGNOSIS — N390 Urinary tract infection, site not specified: Secondary | ICD-10-CM | POA: Diagnosis not present

## 2017-11-16 DIAGNOSIS — I517 Cardiomegaly: Secondary | ICD-10-CM | POA: Diagnosis not present

## 2017-11-16 DIAGNOSIS — R9389 Abnormal findings on diagnostic imaging of other specified body structures: Secondary | ICD-10-CM | POA: Diagnosis not present

## 2017-11-16 DIAGNOSIS — G609 Hereditary and idiopathic neuropathy, unspecified: Secondary | ICD-10-CM | POA: Diagnosis not present

## 2017-11-16 DIAGNOSIS — Z888 Allergy status to other drugs, medicaments and biological substances status: Secondary | ICD-10-CM | POA: Diagnosis not present

## 2017-11-16 DIAGNOSIS — I509 Heart failure, unspecified: Secondary | ICD-10-CM | POA: Diagnosis not present

## 2017-11-16 DIAGNOSIS — I110 Hypertensive heart disease with heart failure: Secondary | ICD-10-CM | POA: Diagnosis not present

## 2017-11-16 DIAGNOSIS — I5022 Chronic systolic (congestive) heart failure: Secondary | ICD-10-CM | POA: Diagnosis not present

## 2017-11-16 DIAGNOSIS — Z6841 Body Mass Index (BMI) 40.0 and over, adult: Secondary | ICD-10-CM | POA: Diagnosis not present

## 2017-11-16 DIAGNOSIS — I2510 Atherosclerotic heart disease of native coronary artery without angina pectoris: Secondary | ICD-10-CM | POA: Diagnosis not present

## 2017-11-16 DIAGNOSIS — G4733 Obstructive sleep apnea (adult) (pediatric): Secondary | ICD-10-CM | POA: Diagnosis present

## 2017-11-16 DIAGNOSIS — G8929 Other chronic pain: Secondary | ICD-10-CM | POA: Diagnosis not present

## 2017-11-16 DIAGNOSIS — Z79899 Other long term (current) drug therapy: Secondary | ICD-10-CM | POA: Diagnosis not present

## 2017-11-16 DIAGNOSIS — J984 Other disorders of lung: Secondary | ICD-10-CM | POA: Diagnosis not present

## 2017-11-16 DIAGNOSIS — J9811 Atelectasis: Secondary | ICD-10-CM | POA: Diagnosis not present

## 2017-11-16 DIAGNOSIS — E876 Hypokalemia: Secondary | ICD-10-CM | POA: Diagnosis not present

## 2017-11-16 DIAGNOSIS — J159 Unspecified bacterial pneumonia: Secondary | ICD-10-CM | POA: Diagnosis not present

## 2017-11-16 DIAGNOSIS — E039 Hypothyroidism, unspecified: Secondary | ICD-10-CM | POA: Diagnosis present

## 2017-11-16 DIAGNOSIS — E870 Hyperosmolality and hypernatremia: Secondary | ICD-10-CM | POA: Diagnosis not present

## 2017-11-16 DIAGNOSIS — J9602 Acute respiratory failure with hypercapnia: Secondary | ICD-10-CM | POA: Diagnosis not present

## 2017-11-16 DIAGNOSIS — R918 Other nonspecific abnormal finding of lung field: Secondary | ICD-10-CM | POA: Diagnosis not present

## 2017-11-16 DIAGNOSIS — Z8589 Personal history of malignant neoplasm of other organs and systems: Secondary | ICD-10-CM | POA: Diagnosis not present

## 2017-11-16 DIAGNOSIS — Z87891 Personal history of nicotine dependence: Secondary | ICD-10-CM | POA: Diagnosis not present

## 2017-11-16 DIAGNOSIS — R269 Unspecified abnormalities of gait and mobility: Secondary | ICD-10-CM | POA: Diagnosis not present

## 2017-11-16 DIAGNOSIS — L03119 Cellulitis of unspecified part of limb: Secondary | ICD-10-CM | POA: Diagnosis not present

## 2017-11-16 DIAGNOSIS — I4891 Unspecified atrial fibrillation: Secondary | ICD-10-CM | POA: Diagnosis not present

## 2017-11-16 DIAGNOSIS — D509 Iron deficiency anemia, unspecified: Secondary | ICD-10-CM | POA: Diagnosis not present

## 2017-11-16 DIAGNOSIS — R1312 Dysphagia, oropharyngeal phase: Secondary | ICD-10-CM | POA: Diagnosis not present

## 2017-11-16 DIAGNOSIS — I5040 Unspecified combined systolic (congestive) and diastolic (congestive) heart failure: Secondary | ICD-10-CM | POA: Diagnosis not present

## 2017-11-16 DIAGNOSIS — Z23 Encounter for immunization: Secondary | ICD-10-CM | POA: Diagnosis not present

## 2017-11-16 DIAGNOSIS — M1990 Unspecified osteoarthritis, unspecified site: Secondary | ICD-10-CM | POA: Diagnosis present

## 2017-11-16 DIAGNOSIS — M793 Panniculitis, unspecified: Secondary | ICD-10-CM | POA: Diagnosis not present

## 2017-11-16 DIAGNOSIS — E785 Hyperlipidemia, unspecified: Secondary | ICD-10-CM | POA: Diagnosis not present

## 2017-11-16 DIAGNOSIS — Z7409 Other reduced mobility: Secondary | ICD-10-CM | POA: Diagnosis not present

## 2017-11-16 DIAGNOSIS — J9621 Acute and chronic respiratory failure with hypoxia: Secondary | ICD-10-CM | POA: Diagnosis not present

## 2017-11-16 DIAGNOSIS — Z7982 Long term (current) use of aspirin: Secondary | ICD-10-CM | POA: Diagnosis not present

## 2017-11-16 DIAGNOSIS — I10 Essential (primary) hypertension: Secondary | ICD-10-CM | POA: Diagnosis not present

## 2017-11-16 DIAGNOSIS — Z9981 Dependence on supplemental oxygen: Secondary | ICD-10-CM | POA: Diagnosis not present

## 2017-11-16 DIAGNOSIS — J90 Pleural effusion, not elsewhere classified: Secondary | ICD-10-CM | POA: Diagnosis not present

## 2017-11-16 DIAGNOSIS — I480 Paroxysmal atrial fibrillation: Secondary | ICD-10-CM | POA: Diagnosis not present

## 2017-11-16 DIAGNOSIS — Z049 Encounter for examination and observation for unspecified reason: Secondary | ICD-10-CM | POA: Diagnosis not present

## 2017-11-16 DIAGNOSIS — D649 Anemia, unspecified: Secondary | ICD-10-CM | POA: Diagnosis not present

## 2017-11-16 DIAGNOSIS — R0602 Shortness of breath: Secondary | ICD-10-CM | POA: Diagnosis not present

## 2017-11-16 DIAGNOSIS — E873 Alkalosis: Secondary | ICD-10-CM | POA: Diagnosis present

## 2017-11-16 DIAGNOSIS — Z7901 Long term (current) use of anticoagulants: Secondary | ICD-10-CM | POA: Diagnosis not present

## 2017-11-16 DIAGNOSIS — J9611 Chronic respiratory failure with hypoxia: Secondary | ICD-10-CM | POA: Diagnosis not present

## 2017-11-16 DIAGNOSIS — Z452 Encounter for adjustment and management of vascular access device: Secondary | ICD-10-CM | POA: Diagnosis not present

## 2017-11-16 DIAGNOSIS — F419 Anxiety disorder, unspecified: Secondary | ICD-10-CM | POA: Diagnosis present

## 2017-11-16 DIAGNOSIS — J9601 Acute respiratory failure with hypoxia: Secondary | ICD-10-CM | POA: Diagnosis not present

## 2017-11-16 DIAGNOSIS — M6281 Muscle weakness (generalized): Secondary | ICD-10-CM | POA: Diagnosis not present

## 2017-11-16 DIAGNOSIS — J449 Chronic obstructive pulmonary disease, unspecified: Secondary | ICD-10-CM | POA: Diagnosis not present

## 2017-11-16 DIAGNOSIS — I272 Pulmonary hypertension, unspecified: Secondary | ICD-10-CM | POA: Diagnosis present

## 2017-11-16 DIAGNOSIS — E8770 Fluid overload, unspecified: Secondary | ICD-10-CM | POA: Diagnosis not present

## 2017-11-16 DIAGNOSIS — J9622 Acute and chronic respiratory failure with hypercapnia: Secondary | ICD-10-CM | POA: Diagnosis not present

## 2017-11-16 DIAGNOSIS — I5042 Chronic combined systolic (congestive) and diastolic (congestive) heart failure: Secondary | ICD-10-CM | POA: Diagnosis present

## 2017-11-16 DIAGNOSIS — R278 Other lack of coordination: Secondary | ICD-10-CM | POA: Diagnosis not present

## 2017-11-16 DIAGNOSIS — R4182 Altered mental status, unspecified: Secondary | ICD-10-CM | POA: Diagnosis not present

## 2017-11-18 DIAGNOSIS — J9602 Acute respiratory failure with hypercapnia: Secondary | ICD-10-CM | POA: Diagnosis not present

## 2017-11-18 DIAGNOSIS — G8929 Other chronic pain: Secondary | ICD-10-CM | POA: Diagnosis not present

## 2017-11-18 DIAGNOSIS — J9622 Acute and chronic respiratory failure with hypercapnia: Secondary | ICD-10-CM | POA: Diagnosis not present

## 2017-11-18 DIAGNOSIS — Z9989 Dependence on other enabling machines and devices: Secondary | ICD-10-CM | POA: Diagnosis not present

## 2017-11-18 DIAGNOSIS — I481 Persistent atrial fibrillation: Secondary | ICD-10-CM | POA: Diagnosis not present

## 2017-11-18 DIAGNOSIS — M1990 Unspecified osteoarthritis, unspecified site: Secondary | ICD-10-CM | POA: Diagnosis present

## 2017-11-18 DIAGNOSIS — E876 Hypokalemia: Secondary | ICD-10-CM | POA: Diagnosis not present

## 2017-11-18 DIAGNOSIS — F419 Anxiety disorder, unspecified: Secondary | ICD-10-CM | POA: Diagnosis not present

## 2017-11-18 DIAGNOSIS — R0989 Other specified symptoms and signs involving the circulatory and respiratory systems: Secondary | ICD-10-CM | POA: Diagnosis not present

## 2017-11-18 DIAGNOSIS — J984 Other disorders of lung: Secondary | ICD-10-CM | POA: Diagnosis not present

## 2017-11-18 DIAGNOSIS — R918 Other nonspecific abnormal finding of lung field: Secondary | ICD-10-CM | POA: Diagnosis not present

## 2017-11-18 DIAGNOSIS — J9601 Acute respiratory failure with hypoxia: Secondary | ICD-10-CM | POA: Diagnosis not present

## 2017-11-18 DIAGNOSIS — Z6841 Body Mass Index (BMI) 40.0 and over, adult: Secondary | ICD-10-CM | POA: Diagnosis not present

## 2017-11-18 DIAGNOSIS — I5040 Unspecified combined systolic (congestive) and diastolic (congestive) heart failure: Secondary | ICD-10-CM | POA: Diagnosis not present

## 2017-11-18 DIAGNOSIS — J90 Pleural effusion, not elsewhere classified: Secondary | ICD-10-CM | POA: Diagnosis not present

## 2017-11-18 DIAGNOSIS — I4891 Unspecified atrial fibrillation: Secondary | ICD-10-CM | POA: Diagnosis not present

## 2017-11-18 DIAGNOSIS — J441 Chronic obstructive pulmonary disease with (acute) exacerbation: Secondary | ICD-10-CM | POA: Diagnosis not present

## 2017-11-18 DIAGNOSIS — I2510 Atherosclerotic heart disease of native coronary artery without angina pectoris: Secondary | ICD-10-CM | POA: Diagnosis not present

## 2017-11-18 DIAGNOSIS — Z888 Allergy status to other drugs, medicaments and biological substances status: Secondary | ICD-10-CM | POA: Diagnosis not present

## 2017-11-18 DIAGNOSIS — J9811 Atelectasis: Secondary | ICD-10-CM | POA: Diagnosis not present

## 2017-11-18 DIAGNOSIS — D509 Iron deficiency anemia, unspecified: Secondary | ICD-10-CM | POA: Diagnosis not present

## 2017-11-18 DIAGNOSIS — J9611 Chronic respiratory failure with hypoxia: Secondary | ICD-10-CM | POA: Diagnosis not present

## 2017-11-18 DIAGNOSIS — Z87891 Personal history of nicotine dependence: Secondary | ICD-10-CM | POA: Diagnosis not present

## 2017-11-18 DIAGNOSIS — J989 Respiratory disorder, unspecified: Secondary | ICD-10-CM | POA: Diagnosis not present

## 2017-11-18 DIAGNOSIS — I10 Essential (primary) hypertension: Secondary | ICD-10-CM | POA: Diagnosis not present

## 2017-11-18 DIAGNOSIS — E785 Hyperlipidemia, unspecified: Secondary | ICD-10-CM | POA: Diagnosis not present

## 2017-11-18 DIAGNOSIS — I509 Heart failure, unspecified: Secondary | ICD-10-CM | POA: Diagnosis not present

## 2017-11-18 DIAGNOSIS — E8770 Fluid overload, unspecified: Secondary | ICD-10-CM | POA: Diagnosis not present

## 2017-11-18 DIAGNOSIS — R0602 Shortness of breath: Secondary | ICD-10-CM | POA: Diagnosis not present

## 2017-11-18 DIAGNOSIS — M6281 Muscle weakness (generalized): Secondary | ICD-10-CM | POA: Diagnosis not present

## 2017-11-18 DIAGNOSIS — Z23 Encounter for immunization: Secondary | ICD-10-CM | POA: Diagnosis not present

## 2017-11-18 DIAGNOSIS — Z7901 Long term (current) use of anticoagulants: Secondary | ICD-10-CM | POA: Diagnosis not present

## 2017-11-18 DIAGNOSIS — I517 Cardiomegaly: Secondary | ICD-10-CM | POA: Diagnosis not present

## 2017-11-18 DIAGNOSIS — R9389 Abnormal findings on diagnostic imaging of other specified body structures: Secondary | ICD-10-CM | POA: Diagnosis not present

## 2017-11-18 DIAGNOSIS — I480 Paroxysmal atrial fibrillation: Secondary | ICD-10-CM | POA: Diagnosis not present

## 2017-11-18 DIAGNOSIS — E039 Hypothyroidism, unspecified: Secondary | ICD-10-CM | POA: Diagnosis not present

## 2017-11-18 DIAGNOSIS — R269 Unspecified abnormalities of gait and mobility: Secondary | ICD-10-CM | POA: Diagnosis not present

## 2017-11-18 DIAGNOSIS — Z9981 Dependence on supplemental oxygen: Secondary | ICD-10-CM | POA: Diagnosis not present

## 2017-11-18 DIAGNOSIS — I5043 Acute on chronic combined systolic (congestive) and diastolic (congestive) heart failure: Secondary | ICD-10-CM | POA: Diagnosis not present

## 2017-11-18 DIAGNOSIS — J449 Chronic obstructive pulmonary disease, unspecified: Secondary | ICD-10-CM | POA: Diagnosis not present

## 2017-11-18 DIAGNOSIS — R278 Other lack of coordination: Secondary | ICD-10-CM | POA: Diagnosis not present

## 2017-11-18 DIAGNOSIS — Z8589 Personal history of malignant neoplasm of other organs and systems: Secondary | ICD-10-CM | POA: Diagnosis not present

## 2017-11-18 DIAGNOSIS — R4182 Altered mental status, unspecified: Secondary | ICD-10-CM | POA: Diagnosis not present

## 2017-11-18 DIAGNOSIS — I5042 Chronic combined systolic (congestive) and diastolic (congestive) heart failure: Secondary | ICD-10-CM | POA: Diagnosis not present

## 2017-11-18 DIAGNOSIS — J9383 Other pneumothorax: Secondary | ICD-10-CM | POA: Diagnosis not present

## 2017-11-18 DIAGNOSIS — D649 Anemia, unspecified: Secondary | ICD-10-CM | POA: Diagnosis not present

## 2017-11-18 DIAGNOSIS — Z452 Encounter for adjustment and management of vascular access device: Secondary | ICD-10-CM | POA: Diagnosis not present

## 2017-11-18 DIAGNOSIS — F331 Major depressive disorder, recurrent, moderate: Secondary | ICD-10-CM | POA: Diagnosis not present

## 2017-11-18 DIAGNOSIS — I110 Hypertensive heart disease with heart failure: Secondary | ICD-10-CM | POA: Diagnosis not present

## 2017-11-18 DIAGNOSIS — Z049 Encounter for examination and observation for unspecified reason: Secondary | ICD-10-CM | POA: Diagnosis not present

## 2017-11-18 DIAGNOSIS — I5022 Chronic systolic (congestive) heart failure: Secondary | ICD-10-CM | POA: Diagnosis not present

## 2017-11-18 DIAGNOSIS — I272 Pulmonary hypertension, unspecified: Secondary | ICD-10-CM | POA: Diagnosis not present

## 2017-11-18 DIAGNOSIS — G4733 Obstructive sleep apnea (adult) (pediatric): Secondary | ICD-10-CM | POA: Diagnosis not present

## 2017-11-18 DIAGNOSIS — J939 Pneumothorax, unspecified: Secondary | ICD-10-CM | POA: Diagnosis not present

## 2017-11-18 DIAGNOSIS — N390 Urinary tract infection, site not specified: Secondary | ICD-10-CM | POA: Diagnosis not present

## 2017-11-18 DIAGNOSIS — G4700 Insomnia, unspecified: Secondary | ICD-10-CM | POA: Diagnosis not present

## 2017-11-18 DIAGNOSIS — R1312 Dysphagia, oropharyngeal phase: Secondary | ICD-10-CM | POA: Diagnosis not present

## 2017-11-18 DIAGNOSIS — G609 Hereditary and idiopathic neuropathy, unspecified: Secondary | ICD-10-CM | POA: Diagnosis not present

## 2017-11-18 DIAGNOSIS — Z7982 Long term (current) use of aspirin: Secondary | ICD-10-CM | POA: Diagnosis not present

## 2017-11-18 DIAGNOSIS — Z48813 Encounter for surgical aftercare following surgery on the respiratory system: Secondary | ICD-10-CM | POA: Diagnosis not present

## 2017-11-18 DIAGNOSIS — E873 Alkalosis: Secondary | ICD-10-CM | POA: Diagnosis not present

## 2017-11-18 DIAGNOSIS — E870 Hyperosmolality and hypernatremia: Secondary | ICD-10-CM | POA: Diagnosis not present

## 2017-11-18 DIAGNOSIS — J811 Chronic pulmonary edema: Secondary | ICD-10-CM | POA: Diagnosis not present

## 2017-11-18 DIAGNOSIS — I2723 Pulmonary hypertension due to lung diseases and hypoxia: Secondary | ICD-10-CM | POA: Diagnosis not present

## 2017-11-18 DIAGNOSIS — J9621 Acute and chronic respiratory failure with hypoxia: Secondary | ICD-10-CM | POA: Diagnosis not present

## 2017-11-18 DIAGNOSIS — Z79899 Other long term (current) drug therapy: Secondary | ICD-10-CM | POA: Diagnosis not present

## 2017-11-18 DIAGNOSIS — I071 Rheumatic tricuspid insufficiency: Secondary | ICD-10-CM | POA: Diagnosis not present

## 2017-11-18 DIAGNOSIS — I482 Chronic atrial fibrillation: Secondary | ICD-10-CM | POA: Diagnosis not present

## 2017-11-18 MED ORDER — PANTOPRAZOLE SODIUM 40 MG PO TBEC
40.00 | DELAYED_RELEASE_TABLET | ORAL | Status: DC
Start: 2017-11-17 — End: 2017-11-18

## 2017-11-18 MED ORDER — OXYCODONE HCL 5 MG PO TABS
5.00 | ORAL_TABLET | ORAL | Status: DC
Start: ? — End: 2017-11-18

## 2017-11-18 MED ORDER — SODIUM CHLORIDE 0.9 % IV SOLN
INTRAVENOUS | Status: DC
Start: ? — End: 2017-11-18

## 2017-11-18 MED ORDER — POLYVINYL ALCOHOL-POVIDONE PF 1.4-0.6 % OP SOLN
OPHTHALMIC | Status: DC
Start: ? — End: 2017-11-18

## 2017-11-18 MED ORDER — ISOSORBIDE MONONITRATE ER 30 MG PO TB24
30.00 | ORAL_TABLET | ORAL | Status: DC
Start: 2017-11-17 — End: 2017-11-18

## 2017-11-18 MED ORDER — ESCITALOPRAM OXALATE 10 MG PO TABS
20.00 | ORAL_TABLET | ORAL | Status: DC
Start: 2017-11-17 — End: 2017-11-18

## 2017-11-18 MED ORDER — ALPRAZOLAM 0.25 MG PO TABS
.25 | ORAL_TABLET | ORAL | Status: DC
Start: ? — End: 2017-11-18

## 2017-11-18 MED ORDER — VITAMIN B-12 1000 MCG PO TABS
ORAL_TABLET | ORAL | Status: DC
Start: 2017-11-17 — End: 2017-11-18

## 2017-11-18 MED ORDER — POTASSIUM CHLORIDE CRYS ER 20 MEQ PO TBCR
EXTENDED_RELEASE_TABLET | ORAL | Status: DC
Start: 2017-11-16 — End: 2017-11-18

## 2017-11-18 MED ORDER — APIXABAN 5 MG PO TABS
5.00 | ORAL_TABLET | ORAL | Status: DC
Start: 2017-11-16 — End: 2017-11-18

## 2017-11-18 MED ORDER — MIRTAZAPINE 30 MG PO TBDP
30.00 | ORAL_TABLET | ORAL | Status: DC
Start: 2017-11-16 — End: 2017-11-18

## 2017-11-18 MED ORDER — GABAPENTIN 100 MG PO CAPS
100.00 | ORAL_CAPSULE | ORAL | Status: DC
Start: 2017-11-16 — End: 2017-11-18

## 2017-11-18 MED ORDER — LEVOTHYROXINE SODIUM 137 MCG PO TABS
ORAL_TABLET | ORAL | Status: DC
Start: 2017-11-17 — End: 2017-11-18

## 2017-11-18 MED ORDER — ANTACID & ANTIGAS 200-200-20 MG/5ML PO SUSP
30.00 | ORAL | Status: DC
Start: ? — End: 2017-11-18

## 2017-11-18 MED ORDER — METOPROLOL SUCCINATE ER 100 MG PO TB24
100.00 | ORAL_TABLET | ORAL | Status: DC
Start: 2017-11-17 — End: 2017-11-18

## 2017-11-18 MED ORDER — VITAMIN D3 25 MCG (1000 UNIT) PO TABS
ORAL_TABLET | ORAL | Status: DC
Start: 2017-11-17 — End: 2017-11-18

## 2017-11-18 MED ORDER — ASPIRIN 81 MG PO CHEW
81.00 | CHEWABLE_TABLET | ORAL | Status: DC
Start: 2017-11-16 — End: 2017-11-18

## 2017-11-18 MED ORDER — SENNA-DOCUSATE SODIUM 8.6-50 MG PO TABS
ORAL_TABLET | ORAL | Status: DC
Start: 2017-11-16 — End: 2017-11-18

## 2017-11-18 MED ORDER — GENERIC EXTERNAL MEDICATION
Status: DC
Start: ? — End: 2017-11-18

## 2017-11-18 MED ORDER — PILOCARPINE HCL 5 MG PO TABS
5.00 | ORAL_TABLET | ORAL | Status: DC
Start: 2017-11-16 — End: 2017-11-18

## 2017-11-18 MED ORDER — CLOTRIMAZOLE-BETAMETHASONE 1-0.05 % EX CREA
TOPICAL_CREAM | CUTANEOUS | Status: DC
Start: 2017-11-16 — End: 2017-11-18

## 2017-11-18 MED ORDER — ONDANSETRON HCL 4 MG PO TABS
4.00 | ORAL_TABLET | ORAL | Status: DC
Start: ? — End: 2017-11-18

## 2017-11-18 MED ORDER — ATORVASTATIN CALCIUM 20 MG PO TABS
20.00 | ORAL_TABLET | ORAL | Status: DC
Start: 2017-11-16 — End: 2017-11-18

## 2017-11-18 MED ORDER — BUMETANIDE 1 MG PO TABS
0.50 | ORAL_TABLET | ORAL | Status: DC
Start: 2017-11-17 — End: 2017-11-18

## 2017-11-18 MED ORDER — ALBUTEROL SULFATE (2.5 MG/3ML) 0.083% IN NEBU
2.50 | INHALATION_SOLUTION | RESPIRATORY_TRACT | Status: DC
Start: ? — End: 2017-11-18

## 2017-11-18 MED ORDER — MAGNESIUM OXIDE 400 MG PO TABS
400.00 | ORAL_TABLET | ORAL | Status: DC
Start: 2017-11-17 — End: 2017-11-18

## 2017-11-18 MED ORDER — VITAMIN C 500 MG PO TABS
2000.00 | ORAL_TABLET | ORAL | Status: DC
Start: 2017-11-17 — End: 2017-11-18

## 2017-11-18 MED ORDER — NITROGLYCERIN 0.4 MG SL SUBL
.40 | SUBLINGUAL_TABLET | SUBLINGUAL | Status: DC
Start: ? — End: 2017-11-18

## 2017-11-29 DIAGNOSIS — G4700 Insomnia, unspecified: Secondary | ICD-10-CM | POA: Diagnosis not present

## 2017-11-29 DIAGNOSIS — G609 Hereditary and idiopathic neuropathy, unspecified: Secondary | ICD-10-CM | POA: Diagnosis not present

## 2017-11-29 DIAGNOSIS — F331 Major depressive disorder, recurrent, moderate: Secondary | ICD-10-CM | POA: Diagnosis not present

## 2017-11-29 DIAGNOSIS — G8929 Other chronic pain: Secondary | ICD-10-CM | POA: Diagnosis not present

## 2017-11-29 DIAGNOSIS — F419 Anxiety disorder, unspecified: Secondary | ICD-10-CM | POA: Diagnosis not present

## 2017-11-29 DIAGNOSIS — E785 Hyperlipidemia, unspecified: Secondary | ICD-10-CM | POA: Diagnosis not present

## 2017-11-29 DIAGNOSIS — E876 Hypokalemia: Secondary | ICD-10-CM | POA: Diagnosis not present

## 2017-11-29 DIAGNOSIS — I10 Essential (primary) hypertension: Secondary | ICD-10-CM | POA: Diagnosis not present

## 2017-11-29 DIAGNOSIS — I2510 Atherosclerotic heart disease of native coronary artery without angina pectoris: Secondary | ICD-10-CM | POA: Diagnosis not present

## 2017-11-29 DIAGNOSIS — E039 Hypothyroidism, unspecified: Secondary | ICD-10-CM | POA: Diagnosis not present

## 2017-11-29 DIAGNOSIS — G4733 Obstructive sleep apnea (adult) (pediatric): Secondary | ICD-10-CM | POA: Diagnosis not present

## 2017-12-04 DIAGNOSIS — E039 Hypothyroidism, unspecified: Secondary | ICD-10-CM | POA: Diagnosis not present

## 2017-12-04 DIAGNOSIS — E876 Hypokalemia: Secondary | ICD-10-CM | POA: Diagnosis not present

## 2017-12-04 DIAGNOSIS — E785 Hyperlipidemia, unspecified: Secondary | ICD-10-CM | POA: Diagnosis not present

## 2017-12-04 DIAGNOSIS — G8929 Other chronic pain: Secondary | ICD-10-CM | POA: Diagnosis not present

## 2017-12-04 DIAGNOSIS — G609 Hereditary and idiopathic neuropathy, unspecified: Secondary | ICD-10-CM | POA: Diagnosis not present

## 2017-12-04 DIAGNOSIS — I10 Essential (primary) hypertension: Secondary | ICD-10-CM | POA: Diagnosis not present

## 2017-12-04 DIAGNOSIS — F331 Major depressive disorder, recurrent, moderate: Secondary | ICD-10-CM | POA: Diagnosis not present

## 2017-12-04 DIAGNOSIS — F419 Anxiety disorder, unspecified: Secondary | ICD-10-CM | POA: Diagnosis not present

## 2017-12-04 DIAGNOSIS — G4733 Obstructive sleep apnea (adult) (pediatric): Secondary | ICD-10-CM | POA: Diagnosis not present

## 2017-12-04 DIAGNOSIS — I2510 Atherosclerotic heart disease of native coronary artery without angina pectoris: Secondary | ICD-10-CM | POA: Diagnosis not present

## 2017-12-04 DIAGNOSIS — G4700 Insomnia, unspecified: Secondary | ICD-10-CM | POA: Diagnosis not present

## 2017-12-07 DIAGNOSIS — L853 Xerosis cutis: Secondary | ICD-10-CM | POA: Diagnosis not present

## 2017-12-07 DIAGNOSIS — Z9119 Patient's noncompliance with other medical treatment and regimen: Secondary | ICD-10-CM | POA: Diagnosis not present

## 2017-12-07 DIAGNOSIS — S31809A Unspecified open wound of unspecified buttock, initial encounter: Secondary | ICD-10-CM | POA: Diagnosis not present

## 2017-12-07 DIAGNOSIS — M2010 Hallux valgus (acquired), unspecified foot: Secondary | ICD-10-CM | POA: Diagnosis not present

## 2017-12-07 DIAGNOSIS — F064 Anxiety disorder due to known physiological condition: Secondary | ICD-10-CM | POA: Diagnosis not present

## 2017-12-07 DIAGNOSIS — E039 Hypothyroidism, unspecified: Secondary | ICD-10-CM | POA: Diagnosis present

## 2017-12-07 DIAGNOSIS — I10 Essential (primary) hypertension: Secondary | ICD-10-CM | POA: Diagnosis not present

## 2017-12-07 DIAGNOSIS — R269 Unspecified abnormalities of gait and mobility: Secondary | ICD-10-CM | POA: Diagnosis not present

## 2017-12-07 DIAGNOSIS — I2782 Chronic pulmonary embolism: Secondary | ICD-10-CM | POA: Diagnosis not present

## 2017-12-07 DIAGNOSIS — D649 Anemia, unspecified: Secondary | ICD-10-CM | POA: Diagnosis not present

## 2017-12-07 DIAGNOSIS — I482 Chronic atrial fibrillation: Secondary | ICD-10-CM | POA: Diagnosis not present

## 2017-12-07 DIAGNOSIS — F419 Anxiety disorder, unspecified: Secondary | ICD-10-CM | POA: Diagnosis not present

## 2017-12-07 DIAGNOSIS — E785 Hyperlipidemia, unspecified: Secondary | ICD-10-CM | POA: Diagnosis not present

## 2017-12-07 DIAGNOSIS — R770 Abnormality of albumin: Secondary | ICD-10-CM | POA: Diagnosis not present

## 2017-12-07 DIAGNOSIS — J449 Chronic obstructive pulmonary disease, unspecified: Secondary | ICD-10-CM | POA: Diagnosis not present

## 2017-12-07 DIAGNOSIS — R410 Disorientation, unspecified: Secondary | ICD-10-CM | POA: Diagnosis not present

## 2017-12-07 DIAGNOSIS — I4891 Unspecified atrial fibrillation: Secondary | ICD-10-CM | POA: Diagnosis not present

## 2017-12-07 DIAGNOSIS — G4733 Obstructive sleep apnea (adult) (pediatric): Secondary | ICD-10-CM | POA: Diagnosis not present

## 2017-12-07 DIAGNOSIS — J984 Other disorders of lung: Secondary | ICD-10-CM | POA: Diagnosis not present

## 2017-12-07 DIAGNOSIS — I517 Cardiomegaly: Secondary | ICD-10-CM | POA: Diagnosis not present

## 2017-12-07 DIAGNOSIS — R300 Dysuria: Secondary | ICD-10-CM | POA: Diagnosis not present

## 2017-12-07 DIAGNOSIS — K5900 Constipation, unspecified: Secondary | ICD-10-CM | POA: Diagnosis not present

## 2017-12-07 DIAGNOSIS — K219 Gastro-esophageal reflux disease without esophagitis: Secondary | ICD-10-CM | POA: Diagnosis not present

## 2017-12-07 DIAGNOSIS — Z923 Personal history of irradiation: Secondary | ICD-10-CM | POA: Diagnosis not present

## 2017-12-07 DIAGNOSIS — F5101 Primary insomnia: Secondary | ICD-10-CM | POA: Diagnosis not present

## 2017-12-07 DIAGNOSIS — I739 Peripheral vascular disease, unspecified: Secondary | ICD-10-CM | POA: Diagnosis not present

## 2017-12-07 DIAGNOSIS — J90 Pleural effusion, not elsewhere classified: Secondary | ICD-10-CM | POA: Diagnosis not present

## 2017-12-07 DIAGNOSIS — R339 Retention of urine, unspecified: Secondary | ICD-10-CM | POA: Diagnosis present

## 2017-12-07 DIAGNOSIS — E873 Alkalosis: Secondary | ICD-10-CM | POA: Diagnosis not present

## 2017-12-07 DIAGNOSIS — E559 Vitamin D deficiency, unspecified: Secondary | ICD-10-CM | POA: Diagnosis not present

## 2017-12-07 DIAGNOSIS — I071 Rheumatic tricuspid insufficiency: Secondary | ICD-10-CM | POA: Diagnosis present

## 2017-12-07 DIAGNOSIS — E876 Hypokalemia: Secondary | ICD-10-CM | POA: Diagnosis not present

## 2017-12-07 DIAGNOSIS — N390 Urinary tract infection, site not specified: Secondary | ICD-10-CM | POA: Diagnosis not present

## 2017-12-07 DIAGNOSIS — Z86711 Personal history of pulmonary embolism: Secondary | ICD-10-CM | POA: Diagnosis not present

## 2017-12-07 DIAGNOSIS — I5040 Unspecified combined systolic (congestive) and diastolic (congestive) heart failure: Secondary | ICD-10-CM | POA: Diagnosis not present

## 2017-12-07 DIAGNOSIS — G894 Chronic pain syndrome: Secondary | ICD-10-CM | POA: Diagnosis not present

## 2017-12-07 DIAGNOSIS — D510 Vitamin B12 deficiency anemia due to intrinsic factor deficiency: Secondary | ICD-10-CM | POA: Diagnosis not present

## 2017-12-07 DIAGNOSIS — Z049 Encounter for examination and observation for unspecified reason: Secondary | ICD-10-CM | POA: Diagnosis not present

## 2017-12-07 DIAGNOSIS — Z7901 Long term (current) use of anticoagulants: Secondary | ICD-10-CM | POA: Diagnosis not present

## 2017-12-07 DIAGNOSIS — R221 Localized swelling, mass and lump, neck: Secondary | ICD-10-CM | POA: Diagnosis not present

## 2017-12-07 DIAGNOSIS — R600 Localized edema: Secondary | ICD-10-CM | POA: Diagnosis not present

## 2017-12-07 DIAGNOSIS — I872 Venous insufficiency (chronic) (peripheral): Secondary | ICD-10-CM | POA: Diagnosis not present

## 2017-12-07 DIAGNOSIS — L603 Nail dystrophy: Secondary | ICD-10-CM | POA: Diagnosis not present

## 2017-12-07 DIAGNOSIS — F411 Generalized anxiety disorder: Secondary | ICD-10-CM | POA: Diagnosis not present

## 2017-12-07 DIAGNOSIS — R197 Diarrhea, unspecified: Secondary | ICD-10-CM | POA: Diagnosis not present

## 2017-12-07 DIAGNOSIS — R278 Other lack of coordination: Secondary | ICD-10-CM | POA: Diagnosis not present

## 2017-12-07 DIAGNOSIS — G8929 Other chronic pain: Secondary | ICD-10-CM | POA: Diagnosis not present

## 2017-12-07 DIAGNOSIS — J810 Acute pulmonary edema: Secondary | ICD-10-CM | POA: Diagnosis not present

## 2017-12-07 DIAGNOSIS — J9622 Acute and chronic respiratory failure with hypercapnia: Secondary | ICD-10-CM | POA: Diagnosis not present

## 2017-12-07 DIAGNOSIS — Z9989 Dependence on other enabling machines and devices: Secondary | ICD-10-CM | POA: Diagnosis not present

## 2017-12-07 DIAGNOSIS — Z8589 Personal history of malignant neoplasm of other organs and systems: Secondary | ICD-10-CM | POA: Diagnosis not present

## 2017-12-07 DIAGNOSIS — Q845 Enlarged and hypertrophic nails: Secondary | ICD-10-CM | POA: Diagnosis not present

## 2017-12-07 DIAGNOSIS — I481 Persistent atrial fibrillation: Secondary | ICD-10-CM | POA: Diagnosis not present

## 2017-12-07 DIAGNOSIS — M1990 Unspecified osteoarthritis, unspecified site: Secondary | ICD-10-CM | POA: Diagnosis present

## 2017-12-07 DIAGNOSIS — E8770 Fluid overload, unspecified: Secondary | ICD-10-CM | POA: Diagnosis not present

## 2017-12-07 DIAGNOSIS — Z6841 Body Mass Index (BMI) 40.0 and over, adult: Secondary | ICD-10-CM | POA: Diagnosis not present

## 2017-12-07 DIAGNOSIS — D509 Iron deficiency anemia, unspecified: Secondary | ICD-10-CM | POA: Diagnosis not present

## 2017-12-07 DIAGNOSIS — F331 Major depressive disorder, recurrent, moderate: Secondary | ICD-10-CM | POA: Diagnosis not present

## 2017-12-07 DIAGNOSIS — J9611 Chronic respiratory failure with hypoxia: Secondary | ICD-10-CM | POA: Diagnosis not present

## 2017-12-07 DIAGNOSIS — B351 Tinea unguium: Secondary | ICD-10-CM | POA: Diagnosis not present

## 2017-12-07 DIAGNOSIS — I110 Hypertensive heart disease with heart failure: Secondary | ICD-10-CM | POA: Diagnosis not present

## 2017-12-07 DIAGNOSIS — G629 Polyneuropathy, unspecified: Secondary | ICD-10-CM | POA: Diagnosis present

## 2017-12-07 DIAGNOSIS — R0602 Shortness of breath: Secondary | ICD-10-CM | POA: Diagnosis not present

## 2017-12-07 DIAGNOSIS — S30821A Blister (nonthermal) of abdominal wall, initial encounter: Secondary | ICD-10-CM | POA: Diagnosis not present

## 2017-12-07 DIAGNOSIS — R1312 Dysphagia, oropharyngeal phase: Secondary | ICD-10-CM | POA: Diagnosis not present

## 2017-12-07 DIAGNOSIS — I2510 Atherosclerotic heart disease of native coronary artery without angina pectoris: Secondary | ICD-10-CM | POA: Diagnosis not present

## 2017-12-07 DIAGNOSIS — J989 Respiratory disorder, unspecified: Secondary | ICD-10-CM | POA: Diagnosis not present

## 2017-12-07 DIAGNOSIS — G4700 Insomnia, unspecified: Secondary | ICD-10-CM | POA: Diagnosis not present

## 2017-12-07 DIAGNOSIS — I509 Heart failure, unspecified: Secondary | ICD-10-CM | POA: Diagnosis not present

## 2017-12-07 DIAGNOSIS — J9621 Acute and chronic respiratory failure with hypoxia: Secondary | ICD-10-CM | POA: Diagnosis present

## 2017-12-07 DIAGNOSIS — I272 Pulmonary hypertension, unspecified: Secondary | ICD-10-CM | POA: Diagnosis present

## 2017-12-07 DIAGNOSIS — E870 Hyperosmolality and hypernatremia: Secondary | ICD-10-CM | POA: Diagnosis not present

## 2017-12-07 DIAGNOSIS — R601 Generalized edema: Secondary | ICD-10-CM | POA: Diagnosis present

## 2017-12-07 DIAGNOSIS — I472 Ventricular tachycardia: Secondary | ICD-10-CM | POA: Diagnosis present

## 2017-12-07 DIAGNOSIS — R0600 Dyspnea, unspecified: Secondary | ICD-10-CM | POA: Diagnosis not present

## 2017-12-07 DIAGNOSIS — I428 Other cardiomyopathies: Secondary | ICD-10-CM | POA: Diagnosis present

## 2017-12-07 DIAGNOSIS — I5042 Chronic combined systolic (congestive) and diastolic (congestive) heart failure: Secondary | ICD-10-CM | POA: Diagnosis not present

## 2017-12-07 DIAGNOSIS — M6281 Muscle weakness (generalized): Secondary | ICD-10-CM | POA: Diagnosis not present

## 2017-12-07 DIAGNOSIS — E119 Type 2 diabetes mellitus without complications: Secondary | ICD-10-CM | POA: Diagnosis not present

## 2017-12-07 DIAGNOSIS — E1159 Type 2 diabetes mellitus with other circulatory complications: Secondary | ICD-10-CM | POA: Diagnosis not present

## 2017-12-07 DIAGNOSIS — G609 Hereditary and idiopathic neuropathy, unspecified: Secondary | ICD-10-CM | POA: Diagnosis not present

## 2017-12-07 DIAGNOSIS — R443 Hallucinations, unspecified: Secondary | ICD-10-CM | POA: Diagnosis not present

## 2017-12-07 DIAGNOSIS — F5105 Insomnia due to other mental disorder: Secondary | ICD-10-CM | POA: Diagnosis not present

## 2017-12-07 DIAGNOSIS — I5043 Acute on chronic combined systolic (congestive) and diastolic (congestive) heart failure: Secondary | ICD-10-CM | POA: Diagnosis not present

## 2017-12-09 MED ORDER — CLOTRIMAZOLE 1 % EX CREA
TOPICAL_CREAM | CUTANEOUS | Status: DC
Start: 2017-12-07 — End: 2017-12-09

## 2017-12-09 MED ORDER — OXYCODONE HCL 10 MG PO TABS
10.00 | ORAL_TABLET | ORAL | Status: DC
Start: ? — End: 2017-12-09

## 2017-12-09 MED ORDER — IPRATROPIUM-ALBUTEROL 0.5-2.5 (3) MG/3ML IN SOLN
3.00 | RESPIRATORY_TRACT | Status: DC
Start: ? — End: 2017-12-09

## 2017-12-09 MED ORDER — GENERIC EXTERNAL MEDICATION
Status: DC
Start: 2017-12-08 — End: 2017-12-09

## 2017-12-09 MED ORDER — POTASSIUM CHLORIDE CRYS ER 20 MEQ PO TBCR
EXTENDED_RELEASE_TABLET | ORAL | Status: DC
Start: 2017-12-08 — End: 2017-12-09

## 2017-12-09 MED ORDER — MIRTAZAPINE 15 MG PO TBDP
30.00 | ORAL_TABLET | ORAL | Status: DC
Start: 2017-12-07 — End: 2017-12-09

## 2017-12-09 MED ORDER — ANTACID & ANTIGAS 200-200-20 MG/5ML PO SUSP
30.00 | ORAL | Status: DC
Start: ? — End: 2017-12-09

## 2017-12-09 MED ORDER — ACETAMINOPHEN 325 MG PO TABS
650.00 | ORAL_TABLET | ORAL | Status: DC
Start: ? — End: 2017-12-09

## 2017-12-09 MED ORDER — GABAPENTIN 100 MG PO CAPS
100.00 | ORAL_CAPSULE | ORAL | Status: DC
Start: 2017-12-07 — End: 2017-12-09

## 2017-12-09 MED ORDER — APIXABAN 5 MG PO TABS
5.00 | ORAL_TABLET | ORAL | Status: DC
Start: 2017-12-07 — End: 2017-12-09

## 2017-12-09 MED ORDER — GUAIFENESIN 400 MG PO TABS
400.00 | ORAL_TABLET | ORAL | Status: DC
Start: 2017-12-07 — End: 2017-12-09

## 2017-12-09 MED ORDER — ISOSORBIDE MONONITRATE ER 30 MG PO TB24
30.00 | ORAL_TABLET | ORAL | Status: DC
Start: 2017-12-08 — End: 2017-12-09

## 2017-12-09 MED ORDER — BUMETANIDE 1 MG PO TABS
0.50 | ORAL_TABLET | ORAL | Status: DC
Start: 2017-12-07 — End: 2017-12-09

## 2017-12-09 MED ORDER — ATORVASTATIN CALCIUM 20 MG PO TABS
20.00 | ORAL_TABLET | ORAL | Status: DC
Start: 2017-12-08 — End: 2017-12-09

## 2017-12-09 MED ORDER — ESCITALOPRAM OXALATE 10 MG PO TABS
20.00 | ORAL_TABLET | ORAL | Status: DC
Start: 2017-12-08 — End: 2017-12-09

## 2017-12-09 MED ORDER — GENERIC EXTERNAL MEDICATION
Status: DC
Start: ? — End: 2017-12-09

## 2017-12-09 MED ORDER — METOPROLOL SUCCINATE ER 100 MG PO TB24
100.00 | ORAL_TABLET | ORAL | Status: DC
Start: 2017-12-08 — End: 2017-12-09

## 2017-12-09 MED ORDER — SODIUM CHLORIDE 0.9 % IV SOLN
INTRAVENOUS | Status: DC
Start: ? — End: 2017-12-09

## 2017-12-09 MED ORDER — NITROGLYCERIN 0.4 MG SL SUBL
0.40 | SUBLINGUAL_TABLET | SUBLINGUAL | Status: DC
Start: ? — End: 2017-12-09

## 2017-12-09 MED ORDER — POLYETHYLENE GLYCOL 3350 17 G PO PACK
17.00 | PACK | ORAL | Status: DC
Start: ? — End: 2017-12-09

## 2017-12-09 MED ORDER — ALPRAZOLAM 1 MG PO TABS
1.00 | ORAL_TABLET | ORAL | Status: DC
Start: ? — End: 2017-12-09

## 2017-12-09 MED ORDER — PILOCARPINE HCL 5 MG PO TABS
5.00 | ORAL_TABLET | ORAL | Status: DC
Start: 2017-12-07 — End: 2017-12-09

## 2017-12-11 DIAGNOSIS — G8929 Other chronic pain: Secondary | ICD-10-CM | POA: Diagnosis not present

## 2017-12-11 DIAGNOSIS — K219 Gastro-esophageal reflux disease without esophagitis: Secondary | ICD-10-CM | POA: Diagnosis not present

## 2017-12-11 DIAGNOSIS — I5042 Chronic combined systolic (congestive) and diastolic (congestive) heart failure: Secondary | ICD-10-CM | POA: Diagnosis not present

## 2017-12-11 DIAGNOSIS — F419 Anxiety disorder, unspecified: Secondary | ICD-10-CM | POA: Diagnosis not present

## 2017-12-11 DIAGNOSIS — G4700 Insomnia, unspecified: Secondary | ICD-10-CM | POA: Diagnosis not present

## 2017-12-11 DIAGNOSIS — K5900 Constipation, unspecified: Secondary | ICD-10-CM | POA: Diagnosis not present

## 2017-12-11 DIAGNOSIS — G4733 Obstructive sleep apnea (adult) (pediatric): Secondary | ICD-10-CM | POA: Diagnosis not present

## 2017-12-11 DIAGNOSIS — M6281 Muscle weakness (generalized): Secondary | ICD-10-CM | POA: Diagnosis not present

## 2017-12-11 DIAGNOSIS — I481 Persistent atrial fibrillation: Secondary | ICD-10-CM | POA: Diagnosis not present

## 2017-12-11 DIAGNOSIS — G609 Hereditary and idiopathic neuropathy, unspecified: Secondary | ICD-10-CM | POA: Diagnosis not present

## 2017-12-11 DIAGNOSIS — F064 Anxiety disorder due to known physiological condition: Secondary | ICD-10-CM | POA: Diagnosis not present

## 2017-12-11 DIAGNOSIS — F331 Major depressive disorder, recurrent, moderate: Secondary | ICD-10-CM | POA: Diagnosis not present

## 2017-12-11 DIAGNOSIS — E039 Hypothyroidism, unspecified: Secondary | ICD-10-CM | POA: Diagnosis not present

## 2017-12-11 DIAGNOSIS — I2510 Atherosclerotic heart disease of native coronary artery without angina pectoris: Secondary | ICD-10-CM | POA: Diagnosis not present

## 2017-12-14 DIAGNOSIS — I2510 Atherosclerotic heart disease of native coronary artery without angina pectoris: Secondary | ICD-10-CM | POA: Diagnosis not present

## 2017-12-14 DIAGNOSIS — I5042 Chronic combined systolic (congestive) and diastolic (congestive) heart failure: Secondary | ICD-10-CM | POA: Diagnosis not present

## 2017-12-14 DIAGNOSIS — Z9119 Patient's noncompliance with other medical treatment and regimen: Secondary | ICD-10-CM | POA: Diagnosis not present

## 2017-12-14 DIAGNOSIS — K219 Gastro-esophageal reflux disease without esophagitis: Secondary | ICD-10-CM | POA: Diagnosis not present

## 2017-12-14 DIAGNOSIS — G8929 Other chronic pain: Secondary | ICD-10-CM | POA: Diagnosis not present

## 2017-12-14 DIAGNOSIS — R410 Disorientation, unspecified: Secondary | ICD-10-CM | POA: Diagnosis not present

## 2017-12-14 DIAGNOSIS — E039 Hypothyroidism, unspecified: Secondary | ICD-10-CM | POA: Diagnosis not present

## 2017-12-14 DIAGNOSIS — G609 Hereditary and idiopathic neuropathy, unspecified: Secondary | ICD-10-CM | POA: Diagnosis not present

## 2017-12-14 DIAGNOSIS — R600 Localized edema: Secondary | ICD-10-CM | POA: Diagnosis not present

## 2017-12-14 DIAGNOSIS — I481 Persistent atrial fibrillation: Secondary | ICD-10-CM | POA: Diagnosis not present

## 2017-12-14 DIAGNOSIS — G4733 Obstructive sleep apnea (adult) (pediatric): Secondary | ICD-10-CM | POA: Diagnosis not present

## 2017-12-14 DIAGNOSIS — M6281 Muscle weakness (generalized): Secondary | ICD-10-CM | POA: Diagnosis not present

## 2017-12-17 DIAGNOSIS — R410 Disorientation, unspecified: Secondary | ICD-10-CM | POA: Diagnosis not present

## 2017-12-17 DIAGNOSIS — I2510 Atherosclerotic heart disease of native coronary artery without angina pectoris: Secondary | ICD-10-CM | POA: Diagnosis not present

## 2017-12-17 DIAGNOSIS — G609 Hereditary and idiopathic neuropathy, unspecified: Secondary | ICD-10-CM | POA: Diagnosis not present

## 2017-12-17 DIAGNOSIS — I481 Persistent atrial fibrillation: Secondary | ICD-10-CM | POA: Diagnosis not present

## 2017-12-17 DIAGNOSIS — M6281 Muscle weakness (generalized): Secondary | ICD-10-CM | POA: Diagnosis not present

## 2017-12-17 DIAGNOSIS — G8929 Other chronic pain: Secondary | ICD-10-CM | POA: Diagnosis not present

## 2017-12-17 DIAGNOSIS — E876 Hypokalemia: Secondary | ICD-10-CM | POA: Diagnosis not present

## 2017-12-17 DIAGNOSIS — I5042 Chronic combined systolic (congestive) and diastolic (congestive) heart failure: Secondary | ICD-10-CM | POA: Diagnosis not present

## 2017-12-17 DIAGNOSIS — K219 Gastro-esophageal reflux disease without esophagitis: Secondary | ICD-10-CM | POA: Diagnosis not present

## 2017-12-17 DIAGNOSIS — Z9119 Patient's noncompliance with other medical treatment and regimen: Secondary | ICD-10-CM | POA: Diagnosis not present

## 2017-12-17 DIAGNOSIS — R600 Localized edema: Secondary | ICD-10-CM | POA: Diagnosis not present

## 2017-12-17 DIAGNOSIS — E039 Hypothyroidism, unspecified: Secondary | ICD-10-CM | POA: Diagnosis not present

## 2017-12-18 DIAGNOSIS — I5042 Chronic combined systolic (congestive) and diastolic (congestive) heart failure: Secondary | ICD-10-CM | POA: Diagnosis not present

## 2017-12-18 DIAGNOSIS — F419 Anxiety disorder, unspecified: Secondary | ICD-10-CM | POA: Diagnosis not present

## 2017-12-18 DIAGNOSIS — I481 Persistent atrial fibrillation: Secondary | ICD-10-CM | POA: Diagnosis not present

## 2017-12-18 DIAGNOSIS — K5900 Constipation, unspecified: Secondary | ICD-10-CM | POA: Diagnosis not present

## 2017-12-18 DIAGNOSIS — E039 Hypothyroidism, unspecified: Secondary | ICD-10-CM | POA: Diagnosis not present

## 2017-12-18 DIAGNOSIS — R600 Localized edema: Secondary | ICD-10-CM | POA: Diagnosis not present

## 2017-12-18 DIAGNOSIS — G609 Hereditary and idiopathic neuropathy, unspecified: Secondary | ICD-10-CM | POA: Diagnosis not present

## 2017-12-18 DIAGNOSIS — M6281 Muscle weakness (generalized): Secondary | ICD-10-CM | POA: Diagnosis not present

## 2017-12-18 DIAGNOSIS — G8929 Other chronic pain: Secondary | ICD-10-CM | POA: Diagnosis not present

## 2017-12-18 DIAGNOSIS — G4733 Obstructive sleep apnea (adult) (pediatric): Secondary | ICD-10-CM | POA: Diagnosis not present

## 2017-12-18 DIAGNOSIS — K219 Gastro-esophageal reflux disease without esophagitis: Secondary | ICD-10-CM | POA: Diagnosis not present

## 2017-12-18 DIAGNOSIS — I2510 Atherosclerotic heart disease of native coronary artery without angina pectoris: Secondary | ICD-10-CM | POA: Diagnosis not present

## 2017-12-25 DIAGNOSIS — G8929 Other chronic pain: Secondary | ICD-10-CM | POA: Diagnosis not present

## 2017-12-25 DIAGNOSIS — R221 Localized swelling, mass and lump, neck: Secondary | ICD-10-CM | POA: Diagnosis not present

## 2017-12-25 DIAGNOSIS — E039 Hypothyroidism, unspecified: Secondary | ICD-10-CM | POA: Diagnosis not present

## 2017-12-25 DIAGNOSIS — K219 Gastro-esophageal reflux disease without esophagitis: Secondary | ICD-10-CM | POA: Diagnosis not present

## 2017-12-25 DIAGNOSIS — K5900 Constipation, unspecified: Secondary | ICD-10-CM | POA: Diagnosis not present

## 2017-12-25 DIAGNOSIS — R600 Localized edema: Secondary | ICD-10-CM | POA: Diagnosis not present

## 2017-12-25 DIAGNOSIS — G609 Hereditary and idiopathic neuropathy, unspecified: Secondary | ICD-10-CM | POA: Diagnosis not present

## 2017-12-25 DIAGNOSIS — G4733 Obstructive sleep apnea (adult) (pediatric): Secondary | ICD-10-CM | POA: Diagnosis not present

## 2017-12-25 DIAGNOSIS — I481 Persistent atrial fibrillation: Secondary | ICD-10-CM | POA: Diagnosis not present

## 2017-12-25 DIAGNOSIS — I5042 Chronic combined systolic (congestive) and diastolic (congestive) heart failure: Secondary | ICD-10-CM | POA: Diagnosis not present

## 2017-12-25 DIAGNOSIS — I2510 Atherosclerotic heart disease of native coronary artery without angina pectoris: Secondary | ICD-10-CM | POA: Diagnosis not present

## 2017-12-25 DIAGNOSIS — M6281 Muscle weakness (generalized): Secondary | ICD-10-CM | POA: Diagnosis not present

## 2017-12-28 DIAGNOSIS — G4733 Obstructive sleep apnea (adult) (pediatric): Secondary | ICD-10-CM | POA: Diagnosis not present

## 2017-12-28 DIAGNOSIS — E876 Hypokalemia: Secondary | ICD-10-CM | POA: Diagnosis not present

## 2017-12-28 DIAGNOSIS — E785 Hyperlipidemia, unspecified: Secondary | ICD-10-CM | POA: Diagnosis not present

## 2017-12-28 DIAGNOSIS — F419 Anxiety disorder, unspecified: Secondary | ICD-10-CM | POA: Diagnosis not present

## 2017-12-28 DIAGNOSIS — I10 Essential (primary) hypertension: Secondary | ICD-10-CM | POA: Diagnosis not present

## 2017-12-28 DIAGNOSIS — G609 Hereditary and idiopathic neuropathy, unspecified: Secondary | ICD-10-CM | POA: Diagnosis not present

## 2017-12-28 DIAGNOSIS — E039 Hypothyroidism, unspecified: Secondary | ICD-10-CM | POA: Diagnosis not present

## 2017-12-28 DIAGNOSIS — G8929 Other chronic pain: Secondary | ICD-10-CM | POA: Diagnosis not present

## 2017-12-28 DIAGNOSIS — F331 Major depressive disorder, recurrent, moderate: Secondary | ICD-10-CM | POA: Diagnosis not present

## 2017-12-28 DIAGNOSIS — R221 Localized swelling, mass and lump, neck: Secondary | ICD-10-CM | POA: Diagnosis not present

## 2017-12-28 DIAGNOSIS — I2510 Atherosclerotic heart disease of native coronary artery without angina pectoris: Secondary | ICD-10-CM | POA: Diagnosis not present

## 2017-12-28 DIAGNOSIS — R600 Localized edema: Secondary | ICD-10-CM | POA: Diagnosis not present

## 2017-12-28 DIAGNOSIS — G4700 Insomnia, unspecified: Secondary | ICD-10-CM | POA: Diagnosis not present

## 2018-01-07 DIAGNOSIS — F331 Major depressive disorder, recurrent, moderate: Secondary | ICD-10-CM | POA: Diagnosis not present

## 2018-01-07 DIAGNOSIS — G4700 Insomnia, unspecified: Secondary | ICD-10-CM | POA: Diagnosis not present

## 2018-01-07 DIAGNOSIS — F064 Anxiety disorder due to known physiological condition: Secondary | ICD-10-CM | POA: Diagnosis not present

## 2018-01-08 DIAGNOSIS — I5042 Chronic combined systolic (congestive) and diastolic (congestive) heart failure: Secondary | ICD-10-CM | POA: Diagnosis not present

## 2018-01-08 DIAGNOSIS — I2510 Atherosclerotic heart disease of native coronary artery without angina pectoris: Secondary | ICD-10-CM | POA: Diagnosis not present

## 2018-01-08 DIAGNOSIS — M6281 Muscle weakness (generalized): Secondary | ICD-10-CM | POA: Diagnosis not present

## 2018-01-08 DIAGNOSIS — G8929 Other chronic pain: Secondary | ICD-10-CM | POA: Diagnosis not present

## 2018-01-08 DIAGNOSIS — F419 Anxiety disorder, unspecified: Secondary | ICD-10-CM | POA: Diagnosis not present

## 2018-01-08 DIAGNOSIS — K5900 Constipation, unspecified: Secondary | ICD-10-CM | POA: Diagnosis not present

## 2018-01-08 DIAGNOSIS — G609 Hereditary and idiopathic neuropathy, unspecified: Secondary | ICD-10-CM | POA: Diagnosis not present

## 2018-01-08 DIAGNOSIS — G4733 Obstructive sleep apnea (adult) (pediatric): Secondary | ICD-10-CM | POA: Diagnosis not present

## 2018-01-08 DIAGNOSIS — I481 Persistent atrial fibrillation: Secondary | ICD-10-CM | POA: Diagnosis not present

## 2018-01-08 DIAGNOSIS — R600 Localized edema: Secondary | ICD-10-CM | POA: Diagnosis not present

## 2018-01-08 DIAGNOSIS — E039 Hypothyroidism, unspecified: Secondary | ICD-10-CM | POA: Diagnosis not present

## 2018-01-08 DIAGNOSIS — K219 Gastro-esophageal reflux disease without esophagitis: Secondary | ICD-10-CM | POA: Diagnosis not present

## 2018-01-14 DIAGNOSIS — R197 Diarrhea, unspecified: Secondary | ICD-10-CM | POA: Diagnosis not present

## 2018-01-21 DIAGNOSIS — E876 Hypokalemia: Secondary | ICD-10-CM | POA: Diagnosis not present

## 2018-01-21 DIAGNOSIS — R300 Dysuria: Secondary | ICD-10-CM | POA: Diagnosis not present

## 2018-01-21 DIAGNOSIS — K219 Gastro-esophageal reflux disease without esophagitis: Secondary | ICD-10-CM | POA: Diagnosis not present

## 2018-01-21 DIAGNOSIS — R197 Diarrhea, unspecified: Secondary | ICD-10-CM | POA: Diagnosis not present

## 2018-01-22 DIAGNOSIS — I2510 Atherosclerotic heart disease of native coronary artery without angina pectoris: Secondary | ICD-10-CM | POA: Diagnosis not present

## 2018-01-22 DIAGNOSIS — M6281 Muscle weakness (generalized): Secondary | ICD-10-CM | POA: Diagnosis not present

## 2018-01-22 DIAGNOSIS — I481 Persistent atrial fibrillation: Secondary | ICD-10-CM | POA: Diagnosis not present

## 2018-01-22 DIAGNOSIS — K219 Gastro-esophageal reflux disease without esophagitis: Secondary | ICD-10-CM | POA: Diagnosis not present

## 2018-01-22 DIAGNOSIS — G609 Hereditary and idiopathic neuropathy, unspecified: Secondary | ICD-10-CM | POA: Diagnosis not present

## 2018-01-22 DIAGNOSIS — E039 Hypothyroidism, unspecified: Secondary | ICD-10-CM | POA: Diagnosis not present

## 2018-01-22 DIAGNOSIS — F331 Major depressive disorder, recurrent, moderate: Secondary | ICD-10-CM | POA: Diagnosis not present

## 2018-01-22 DIAGNOSIS — G4733 Obstructive sleep apnea (adult) (pediatric): Secondary | ICD-10-CM | POA: Diagnosis not present

## 2018-01-22 DIAGNOSIS — F419 Anxiety disorder, unspecified: Secondary | ICD-10-CM | POA: Diagnosis not present

## 2018-01-22 DIAGNOSIS — K5900 Constipation, unspecified: Secondary | ICD-10-CM | POA: Diagnosis not present

## 2018-01-22 DIAGNOSIS — I5042 Chronic combined systolic (congestive) and diastolic (congestive) heart failure: Secondary | ICD-10-CM | POA: Diagnosis not present

## 2018-01-22 DIAGNOSIS — R600 Localized edema: Secondary | ICD-10-CM | POA: Diagnosis not present

## 2018-01-22 DIAGNOSIS — G8929 Other chronic pain: Secondary | ICD-10-CM | POA: Diagnosis not present

## 2018-01-29 DIAGNOSIS — G8929 Other chronic pain: Secondary | ICD-10-CM | POA: Diagnosis not present

## 2018-01-29 DIAGNOSIS — G609 Hereditary and idiopathic neuropathy, unspecified: Secondary | ICD-10-CM | POA: Diagnosis not present

## 2018-01-29 DIAGNOSIS — F331 Major depressive disorder, recurrent, moderate: Secondary | ICD-10-CM | POA: Diagnosis not present

## 2018-01-29 DIAGNOSIS — E876 Hypokalemia: Secondary | ICD-10-CM | POA: Diagnosis not present

## 2018-01-29 DIAGNOSIS — F419 Anxiety disorder, unspecified: Secondary | ICD-10-CM | POA: Diagnosis not present

## 2018-01-29 DIAGNOSIS — E785 Hyperlipidemia, unspecified: Secondary | ICD-10-CM | POA: Diagnosis not present

## 2018-01-29 DIAGNOSIS — I10 Essential (primary) hypertension: Secondary | ICD-10-CM | POA: Diagnosis not present

## 2018-01-29 DIAGNOSIS — G4733 Obstructive sleep apnea (adult) (pediatric): Secondary | ICD-10-CM | POA: Diagnosis not present

## 2018-01-29 DIAGNOSIS — I2510 Atherosclerotic heart disease of native coronary artery without angina pectoris: Secondary | ICD-10-CM | POA: Diagnosis not present

## 2018-01-29 DIAGNOSIS — G4700 Insomnia, unspecified: Secondary | ICD-10-CM | POA: Diagnosis not present

## 2018-01-29 DIAGNOSIS — E039 Hypothyroidism, unspecified: Secondary | ICD-10-CM | POA: Diagnosis not present

## 2018-02-08 DIAGNOSIS — G894 Chronic pain syndrome: Secondary | ICD-10-CM | POA: Diagnosis not present

## 2018-02-12 DIAGNOSIS — F331 Major depressive disorder, recurrent, moderate: Secondary | ICD-10-CM | POA: Diagnosis not present

## 2018-02-12 DIAGNOSIS — I481 Persistent atrial fibrillation: Secondary | ICD-10-CM | POA: Diagnosis not present

## 2018-02-12 DIAGNOSIS — G609 Hereditary and idiopathic neuropathy, unspecified: Secondary | ICD-10-CM | POA: Diagnosis not present

## 2018-02-12 DIAGNOSIS — E039 Hypothyroidism, unspecified: Secondary | ICD-10-CM | POA: Diagnosis not present

## 2018-02-12 DIAGNOSIS — M6281 Muscle weakness (generalized): Secondary | ICD-10-CM | POA: Diagnosis not present

## 2018-02-12 DIAGNOSIS — R600 Localized edema: Secondary | ICD-10-CM | POA: Diagnosis not present

## 2018-02-12 DIAGNOSIS — G8929 Other chronic pain: Secondary | ICD-10-CM | POA: Diagnosis not present

## 2018-02-12 DIAGNOSIS — G4733 Obstructive sleep apnea (adult) (pediatric): Secondary | ICD-10-CM | POA: Diagnosis not present

## 2018-02-12 DIAGNOSIS — K219 Gastro-esophageal reflux disease without esophagitis: Secondary | ICD-10-CM | POA: Diagnosis not present

## 2018-02-12 DIAGNOSIS — F419 Anxiety disorder, unspecified: Secondary | ICD-10-CM | POA: Diagnosis not present

## 2018-02-12 DIAGNOSIS — I5042 Chronic combined systolic (congestive) and diastolic (congestive) heart failure: Secondary | ICD-10-CM | POA: Diagnosis not present

## 2018-02-12 DIAGNOSIS — K5900 Constipation, unspecified: Secondary | ICD-10-CM | POA: Diagnosis not present

## 2018-02-12 DIAGNOSIS — I2510 Atherosclerotic heart disease of native coronary artery without angina pectoris: Secondary | ICD-10-CM | POA: Diagnosis not present

## 2018-02-18 DIAGNOSIS — S31809A Unspecified open wound of unspecified buttock, initial encounter: Secondary | ICD-10-CM | POA: Diagnosis not present

## 2018-02-18 DIAGNOSIS — R339 Retention of urine, unspecified: Secondary | ICD-10-CM | POA: Diagnosis not present

## 2018-03-01 DIAGNOSIS — E876 Hypokalemia: Secondary | ICD-10-CM | POA: Diagnosis not present

## 2018-03-01 DIAGNOSIS — I2510 Atherosclerotic heart disease of native coronary artery without angina pectoris: Secondary | ICD-10-CM | POA: Diagnosis not present

## 2018-03-01 DIAGNOSIS — F419 Anxiety disorder, unspecified: Secondary | ICD-10-CM | POA: Diagnosis not present

## 2018-03-01 DIAGNOSIS — G609 Hereditary and idiopathic neuropathy, unspecified: Secondary | ICD-10-CM | POA: Diagnosis not present

## 2018-03-01 DIAGNOSIS — G8929 Other chronic pain: Secondary | ICD-10-CM | POA: Diagnosis not present

## 2018-03-01 DIAGNOSIS — G4700 Insomnia, unspecified: Secondary | ICD-10-CM | POA: Diagnosis not present

## 2018-03-01 DIAGNOSIS — I10 Essential (primary) hypertension: Secondary | ICD-10-CM | POA: Diagnosis not present

## 2018-03-01 DIAGNOSIS — G4733 Obstructive sleep apnea (adult) (pediatric): Secondary | ICD-10-CM | POA: Diagnosis not present

## 2018-03-01 DIAGNOSIS — E785 Hyperlipidemia, unspecified: Secondary | ICD-10-CM | POA: Diagnosis not present

## 2018-03-01 DIAGNOSIS — E039 Hypothyroidism, unspecified: Secondary | ICD-10-CM | POA: Diagnosis not present

## 2018-03-01 DIAGNOSIS — F331 Major depressive disorder, recurrent, moderate: Secondary | ICD-10-CM | POA: Diagnosis not present

## 2018-03-05 DIAGNOSIS — G894 Chronic pain syndrome: Secondary | ICD-10-CM | POA: Diagnosis not present

## 2018-03-08 DIAGNOSIS — S30821A Blister (nonthermal) of abdominal wall, initial encounter: Secondary | ICD-10-CM | POA: Diagnosis not present

## 2018-03-12 DIAGNOSIS — F331 Major depressive disorder, recurrent, moderate: Secondary | ICD-10-CM | POA: Diagnosis not present

## 2018-03-13 DIAGNOSIS — F419 Anxiety disorder, unspecified: Secondary | ICD-10-CM | POA: Diagnosis not present

## 2018-03-13 DIAGNOSIS — I481 Persistent atrial fibrillation: Secondary | ICD-10-CM | POA: Diagnosis not present

## 2018-03-13 DIAGNOSIS — G609 Hereditary and idiopathic neuropathy, unspecified: Secondary | ICD-10-CM | POA: Diagnosis not present

## 2018-03-13 DIAGNOSIS — G8929 Other chronic pain: Secondary | ICD-10-CM | POA: Diagnosis not present

## 2018-03-13 DIAGNOSIS — M6281 Muscle weakness (generalized): Secondary | ICD-10-CM | POA: Diagnosis not present

## 2018-03-13 DIAGNOSIS — G4733 Obstructive sleep apnea (adult) (pediatric): Secondary | ICD-10-CM | POA: Diagnosis not present

## 2018-03-13 DIAGNOSIS — E039 Hypothyroidism, unspecified: Secondary | ICD-10-CM | POA: Diagnosis not present

## 2018-03-13 DIAGNOSIS — R600 Localized edema: Secondary | ICD-10-CM | POA: Diagnosis not present

## 2018-03-13 DIAGNOSIS — I5042 Chronic combined systolic (congestive) and diastolic (congestive) heart failure: Secondary | ICD-10-CM | POA: Diagnosis not present

## 2018-03-13 DIAGNOSIS — K219 Gastro-esophageal reflux disease without esophagitis: Secondary | ICD-10-CM | POA: Diagnosis not present

## 2018-03-13 DIAGNOSIS — I2510 Atherosclerotic heart disease of native coronary artery without angina pectoris: Secondary | ICD-10-CM | POA: Diagnosis not present

## 2018-03-13 DIAGNOSIS — K5900 Constipation, unspecified: Secondary | ICD-10-CM | POA: Diagnosis not present

## 2018-03-23 DIAGNOSIS — N390 Urinary tract infection, site not specified: Secondary | ICD-10-CM | POA: Diagnosis not present

## 2018-03-25 DIAGNOSIS — N390 Urinary tract infection, site not specified: Secondary | ICD-10-CM | POA: Diagnosis not present

## 2018-04-08 DIAGNOSIS — G894 Chronic pain syndrome: Secondary | ICD-10-CM | POA: Diagnosis not present

## 2018-04-09 DIAGNOSIS — F331 Major depressive disorder, recurrent, moderate: Secondary | ICD-10-CM | POA: Diagnosis not present

## 2018-04-09 DIAGNOSIS — F5105 Insomnia due to other mental disorder: Secondary | ICD-10-CM | POA: Diagnosis not present

## 2018-04-09 DIAGNOSIS — F411 Generalized anxiety disorder: Secondary | ICD-10-CM | POA: Diagnosis not present

## 2018-04-18 DIAGNOSIS — R601 Generalized edema: Secondary | ICD-10-CM | POA: Diagnosis present

## 2018-04-18 DIAGNOSIS — I517 Cardiomegaly: Secondary | ICD-10-CM | POA: Diagnosis not present

## 2018-04-18 DIAGNOSIS — M1990 Unspecified osteoarthritis, unspecified site: Secondary | ICD-10-CM | POA: Diagnosis present

## 2018-04-18 DIAGNOSIS — D509 Iron deficiency anemia, unspecified: Secondary | ICD-10-CM | POA: Diagnosis not present

## 2018-04-18 DIAGNOSIS — E039 Hypothyroidism, unspecified: Secondary | ICD-10-CM | POA: Diagnosis present

## 2018-04-18 DIAGNOSIS — R918 Other nonspecific abnormal finding of lung field: Secondary | ICD-10-CM | POA: Diagnosis not present

## 2018-04-18 DIAGNOSIS — I4891 Unspecified atrial fibrillation: Secondary | ICD-10-CM | POA: Diagnosis not present

## 2018-04-18 DIAGNOSIS — E785 Hyperlipidemia, unspecified: Secondary | ICD-10-CM | POA: Diagnosis present

## 2018-04-18 DIAGNOSIS — I2510 Atherosclerotic heart disease of native coronary artery without angina pectoris: Secondary | ICD-10-CM | POA: Diagnosis not present

## 2018-04-18 DIAGNOSIS — I509 Heart failure, unspecified: Secondary | ICD-10-CM | POA: Diagnosis not present

## 2018-04-18 DIAGNOSIS — J810 Acute pulmonary edema: Secondary | ICD-10-CM | POA: Diagnosis not present

## 2018-04-18 DIAGNOSIS — R0602 Shortness of breath: Secondary | ICD-10-CM | POA: Diagnosis not present

## 2018-04-18 DIAGNOSIS — J9621 Acute and chronic respiratory failure with hypoxia: Secondary | ICD-10-CM | POA: Diagnosis not present

## 2018-04-18 DIAGNOSIS — R0600 Dyspnea, unspecified: Secondary | ICD-10-CM | POA: Diagnosis not present

## 2018-04-18 DIAGNOSIS — Z049 Encounter for examination and observation for unspecified reason: Secondary | ICD-10-CM | POA: Diagnosis not present

## 2018-04-18 DIAGNOSIS — G609 Hereditary and idiopathic neuropathy, unspecified: Secondary | ICD-10-CM | POA: Diagnosis not present

## 2018-04-18 DIAGNOSIS — E8770 Fluid overload, unspecified: Secondary | ICD-10-CM | POA: Diagnosis not present

## 2018-04-18 DIAGNOSIS — Z6841 Body Mass Index (BMI) 40.0 and over, adult: Secondary | ICD-10-CM | POA: Diagnosis not present

## 2018-04-18 DIAGNOSIS — I110 Hypertensive heart disease with heart failure: Secondary | ICD-10-CM | POA: Diagnosis not present

## 2018-04-18 DIAGNOSIS — G8929 Other chronic pain: Secondary | ICD-10-CM | POA: Diagnosis not present

## 2018-04-18 DIAGNOSIS — I481 Persistent atrial fibrillation: Secondary | ICD-10-CM | POA: Diagnosis not present

## 2018-04-18 DIAGNOSIS — G629 Polyneuropathy, unspecified: Secondary | ICD-10-CM | POA: Diagnosis present

## 2018-04-18 DIAGNOSIS — E873 Alkalosis: Secondary | ICD-10-CM | POA: Diagnosis not present

## 2018-04-18 DIAGNOSIS — R2689 Other abnormalities of gait and mobility: Secondary | ICD-10-CM | POA: Diagnosis not present

## 2018-04-18 DIAGNOSIS — I10 Essential (primary) hypertension: Secondary | ICD-10-CM | POA: Diagnosis not present

## 2018-04-18 DIAGNOSIS — R293 Abnormal posture: Secondary | ICD-10-CM | POA: Diagnosis not present

## 2018-04-18 DIAGNOSIS — R278 Other lack of coordination: Secondary | ICD-10-CM | POA: Diagnosis not present

## 2018-04-18 DIAGNOSIS — Z923 Personal history of irradiation: Secondary | ICD-10-CM | POA: Diagnosis not present

## 2018-04-18 DIAGNOSIS — I5043 Acute on chronic combined systolic (congestive) and diastolic (congestive) heart failure: Secondary | ICD-10-CM | POA: Diagnosis not present

## 2018-04-18 DIAGNOSIS — Z8589 Personal history of malignant neoplasm of other organs and systems: Secondary | ICD-10-CM | POA: Diagnosis not present

## 2018-04-18 DIAGNOSIS — I5040 Unspecified combined systolic (congestive) and diastolic (congestive) heart failure: Secondary | ICD-10-CM | POA: Diagnosis not present

## 2018-04-18 DIAGNOSIS — D649 Anemia, unspecified: Secondary | ICD-10-CM | POA: Diagnosis not present

## 2018-04-18 DIAGNOSIS — I872 Venous insufficiency (chronic) (peripheral): Secondary | ICD-10-CM | POA: Diagnosis not present

## 2018-04-18 DIAGNOSIS — I071 Rheumatic tricuspid insufficiency: Secondary | ICD-10-CM | POA: Diagnosis present

## 2018-04-18 DIAGNOSIS — Z9989 Dependence on other enabling machines and devices: Secondary | ICD-10-CM | POA: Diagnosis not present

## 2018-04-18 DIAGNOSIS — Z86711 Personal history of pulmonary embolism: Secondary | ICD-10-CM | POA: Diagnosis not present

## 2018-04-18 DIAGNOSIS — I272 Pulmonary hypertension, unspecified: Secondary | ICD-10-CM | POA: Diagnosis present

## 2018-04-18 DIAGNOSIS — K219 Gastro-esophageal reflux disease without esophagitis: Secondary | ICD-10-CM | POA: Diagnosis present

## 2018-04-18 DIAGNOSIS — I482 Chronic atrial fibrillation: Secondary | ICD-10-CM | POA: Diagnosis not present

## 2018-04-18 DIAGNOSIS — I428 Other cardiomyopathies: Secondary | ICD-10-CM | POA: Diagnosis not present

## 2018-04-18 DIAGNOSIS — F419 Anxiety disorder, unspecified: Secondary | ICD-10-CM | POA: Diagnosis present

## 2018-04-18 DIAGNOSIS — R269 Unspecified abnormalities of gait and mobility: Secondary | ICD-10-CM | POA: Diagnosis not present

## 2018-04-18 DIAGNOSIS — I472 Ventricular tachycardia: Secondary | ICD-10-CM | POA: Diagnosis not present

## 2018-04-18 DIAGNOSIS — M6281 Muscle weakness (generalized): Secondary | ICD-10-CM | POA: Diagnosis not present

## 2018-04-18 DIAGNOSIS — G4733 Obstructive sleep apnea (adult) (pediatric): Secondary | ICD-10-CM | POA: Diagnosis present

## 2018-04-18 DIAGNOSIS — Z743 Need for continuous supervision: Secondary | ICD-10-CM | POA: Diagnosis not present

## 2018-04-18 DIAGNOSIS — J90 Pleural effusion, not elsewhere classified: Secondary | ICD-10-CM | POA: Diagnosis not present

## 2018-04-18 DIAGNOSIS — Z7901 Long term (current) use of anticoagulants: Secondary | ICD-10-CM | POA: Diagnosis not present

## 2018-04-18 DIAGNOSIS — R1312 Dysphagia, oropharyngeal phase: Secondary | ICD-10-CM | POA: Diagnosis not present

## 2018-04-18 DIAGNOSIS — R339 Retention of urine, unspecified: Secondary | ICD-10-CM | POA: Diagnosis present

## 2018-04-18 DIAGNOSIS — J449 Chronic obstructive pulmonary disease, unspecified: Secondary | ICD-10-CM | POA: Diagnosis not present

## 2018-04-18 DIAGNOSIS — E876 Hypokalemia: Secondary | ICD-10-CM | POA: Diagnosis not present

## 2018-04-18 DIAGNOSIS — J9611 Chronic respiratory failure with hypoxia: Secondary | ICD-10-CM | POA: Diagnosis not present

## 2018-04-18 DIAGNOSIS — I2782 Chronic pulmonary embolism: Secondary | ICD-10-CM | POA: Diagnosis not present

## 2018-04-23 DIAGNOSIS — K5900 Constipation, unspecified: Secondary | ICD-10-CM | POA: Diagnosis not present

## 2018-04-23 DIAGNOSIS — G4700 Insomnia, unspecified: Secondary | ICD-10-CM | POA: Diagnosis not present

## 2018-04-23 DIAGNOSIS — E873 Alkalosis: Secondary | ICD-10-CM | POA: Diagnosis not present

## 2018-04-23 DIAGNOSIS — E8770 Fluid overload, unspecified: Secondary | ICD-10-CM | POA: Diagnosis not present

## 2018-04-23 DIAGNOSIS — R278 Other lack of coordination: Secondary | ICD-10-CM | POA: Diagnosis not present

## 2018-04-23 DIAGNOSIS — J449 Chronic obstructive pulmonary disease, unspecified: Secondary | ICD-10-CM | POA: Diagnosis not present

## 2018-04-23 DIAGNOSIS — J9611 Chronic respiratory failure with hypoxia: Secondary | ICD-10-CM | POA: Diagnosis not present

## 2018-04-23 DIAGNOSIS — R2689 Other abnormalities of gait and mobility: Secondary | ICD-10-CM | POA: Diagnosis not present

## 2018-04-23 DIAGNOSIS — R110 Nausea: Secondary | ICD-10-CM | POA: Diagnosis not present

## 2018-04-23 DIAGNOSIS — R269 Unspecified abnormalities of gait and mobility: Secondary | ICD-10-CM | POA: Diagnosis not present

## 2018-04-23 DIAGNOSIS — R1312 Dysphagia, oropharyngeal phase: Secondary | ICD-10-CM | POA: Diagnosis not present

## 2018-04-23 DIAGNOSIS — E039 Hypothyroidism, unspecified: Secondary | ICD-10-CM | POA: Diagnosis not present

## 2018-04-23 DIAGNOSIS — R0602 Shortness of breath: Secondary | ICD-10-CM | POA: Diagnosis not present

## 2018-04-23 DIAGNOSIS — K219 Gastro-esophageal reflux disease without esophagitis: Secondary | ICD-10-CM | POA: Diagnosis not present

## 2018-04-23 DIAGNOSIS — I5040 Unspecified combined systolic (congestive) and diastolic (congestive) heart failure: Secondary | ICD-10-CM | POA: Diagnosis not present

## 2018-04-23 DIAGNOSIS — F5105 Insomnia due to other mental disorder: Secondary | ICD-10-CM | POA: Diagnosis not present

## 2018-04-23 DIAGNOSIS — J309 Allergic rhinitis, unspecified: Secondary | ICD-10-CM | POA: Diagnosis not present

## 2018-04-23 DIAGNOSIS — J90 Pleural effusion, not elsewhere classified: Secondary | ICD-10-CM | POA: Diagnosis not present

## 2018-04-23 DIAGNOSIS — G4733 Obstructive sleep apnea (adult) (pediatric): Secondary | ICD-10-CM | POA: Diagnosis not present

## 2018-04-23 DIAGNOSIS — D509 Iron deficiency anemia, unspecified: Secondary | ICD-10-CM | POA: Diagnosis not present

## 2018-04-23 DIAGNOSIS — G8929 Other chronic pain: Secondary | ICD-10-CM | POA: Diagnosis not present

## 2018-04-23 DIAGNOSIS — I5043 Acute on chronic combined systolic (congestive) and diastolic (congestive) heart failure: Secondary | ICD-10-CM | POA: Diagnosis not present

## 2018-04-23 DIAGNOSIS — I481 Persistent atrial fibrillation: Secondary | ICD-10-CM | POA: Diagnosis not present

## 2018-04-23 DIAGNOSIS — S81801A Unspecified open wound, right lower leg, initial encounter: Secondary | ICD-10-CM | POA: Diagnosis not present

## 2018-04-23 DIAGNOSIS — M6281 Muscle weakness (generalized): Secondary | ICD-10-CM | POA: Diagnosis not present

## 2018-04-23 DIAGNOSIS — F331 Major depressive disorder, recurrent, moderate: Secondary | ICD-10-CM | POA: Diagnosis not present

## 2018-04-23 DIAGNOSIS — R293 Abnormal posture: Secondary | ICD-10-CM | POA: Diagnosis not present

## 2018-04-23 DIAGNOSIS — I2510 Atherosclerotic heart disease of native coronary artery without angina pectoris: Secondary | ICD-10-CM | POA: Diagnosis not present

## 2018-04-23 DIAGNOSIS — E876 Hypokalemia: Secondary | ICD-10-CM | POA: Diagnosis not present

## 2018-04-23 DIAGNOSIS — F419 Anxiety disorder, unspecified: Secondary | ICD-10-CM | POA: Diagnosis not present

## 2018-04-23 DIAGNOSIS — Z743 Need for continuous supervision: Secondary | ICD-10-CM | POA: Diagnosis not present

## 2018-04-23 DIAGNOSIS — F411 Generalized anxiety disorder: Secondary | ICD-10-CM | POA: Diagnosis not present

## 2018-04-23 DIAGNOSIS — E785 Hyperlipidemia, unspecified: Secondary | ICD-10-CM | POA: Diagnosis not present

## 2018-04-23 DIAGNOSIS — G609 Hereditary and idiopathic neuropathy, unspecified: Secondary | ICD-10-CM | POA: Diagnosis not present

## 2018-04-23 DIAGNOSIS — D649 Anemia, unspecified: Secondary | ICD-10-CM | POA: Diagnosis not present

## 2018-04-25 MED ORDER — CLOTRIMAZOLE-BETAMETHASONE 1-0.05 % EX CREA
TOPICAL_CREAM | CUTANEOUS | Status: DC
Start: 2018-04-23 — End: 2018-04-25

## 2018-04-25 MED ORDER — POTASSIUM CHLORIDE CRYS ER 20 MEQ PO TBCR
40.00 | EXTENDED_RELEASE_TABLET | ORAL | Status: DC
Start: 2018-04-24 — End: 2018-04-25

## 2018-04-25 MED ORDER — BUMETANIDE 1 MG PO TABS
1.00 | ORAL_TABLET | ORAL | Status: DC
Start: 2018-04-23 — End: 2018-04-25

## 2018-04-25 MED ORDER — PILOCARPINE HCL 5 MG PO TABS
5.00 | ORAL_TABLET | ORAL | Status: DC
Start: 2018-04-23 — End: 2018-04-25

## 2018-04-25 MED ORDER — ESCITALOPRAM OXALATE 10 MG PO TABS
20.00 | ORAL_TABLET | ORAL | Status: DC
Start: 2018-04-24 — End: 2018-04-25

## 2018-04-25 MED ORDER — POLYETHYLENE GLYCOL 3350 17 G PO PACK
17.00 | PACK | ORAL | Status: DC
Start: ? — End: 2018-04-25

## 2018-04-25 MED ORDER — SENNA-DOCUSATE SODIUM 8.6-50 MG PO TABS
1.00 | ORAL_TABLET | ORAL | Status: DC
Start: ? — End: 2018-04-25

## 2018-04-25 MED ORDER — SPIRONOLACTONE 25 MG PO TABS
12.50 | ORAL_TABLET | ORAL | Status: DC
Start: 2018-04-24 — End: 2018-04-25

## 2018-04-25 MED ORDER — SODIUM CHLORIDE 0.9 % IV SOLN
10.00 | INTRAVENOUS | Status: DC
Start: ? — End: 2018-04-25

## 2018-04-25 MED ORDER — VITAMIN C 500 MG PO TABS
2000.00 | ORAL_TABLET | ORAL | Status: DC
Start: 2018-04-24 — End: 2018-04-25

## 2018-04-25 MED ORDER — MELATONIN 3 MG PO TABS
3.00 | ORAL_TABLET | ORAL | Status: DC
Start: 2018-04-23 — End: 2018-04-25

## 2018-04-25 MED ORDER — PRAVASTATIN SODIUM 40 MG PO TABS
40.00 | ORAL_TABLET | ORAL | Status: DC
Start: 2018-04-23 — End: 2018-04-25

## 2018-04-25 MED ORDER — FAMOTIDINE 20 MG PO TABS
10.00 | ORAL_TABLET | ORAL | Status: DC
Start: 2018-04-23 — End: 2018-04-25

## 2018-04-25 MED ORDER — IPRATROPIUM-ALBUTEROL 0.5-2.5 (3) MG/3ML IN SOLN
1.00 | RESPIRATORY_TRACT | Status: DC
Start: 2018-04-23 — End: 2018-04-25

## 2018-04-25 MED ORDER — NITROGLYCERIN 0.4 MG SL SUBL
0.40 | SUBLINGUAL_TABLET | SUBLINGUAL | Status: DC
Start: ? — End: 2018-04-25

## 2018-04-25 MED ORDER — VITAMIN B-12 1000 MCG PO TABS
1000.00 | ORAL_TABLET | ORAL | Status: DC
Start: 2018-04-24 — End: 2018-04-25

## 2018-04-25 MED ORDER — ACETAMINOPHEN 325 MG PO TABS
650.00 | ORAL_TABLET | ORAL | Status: DC
Start: ? — End: 2018-04-25

## 2018-04-25 MED ORDER — ONDANSETRON HCL 4 MG PO TABS
4.00 | ORAL_TABLET | ORAL | Status: DC
Start: ? — End: 2018-04-25

## 2018-04-25 MED ORDER — VITAMIN D3 25 MCG (1000 UNIT) PO TABS
2000.00 | ORAL_TABLET | ORAL | Status: DC
Start: 2018-04-24 — End: 2018-04-25

## 2018-04-25 MED ORDER — GENERIC EXTERNAL MEDICATION
Status: DC
Start: ? — End: 2018-04-25

## 2018-04-25 MED ORDER — OXYCODONE HCL 10 MG PO TABS
10.00 | ORAL_TABLET | ORAL | Status: DC
Start: ? — End: 2018-04-25

## 2018-04-25 MED ORDER — METOPROLOL SUCCINATE ER 50 MG PO TB24
50.00 | ORAL_TABLET | ORAL | Status: DC
Start: 2018-04-24 — End: 2018-04-25

## 2018-04-25 MED ORDER — THERA PO TABS
1.00 | ORAL_TABLET | ORAL | Status: DC
Start: 2018-04-24 — End: 2018-04-25

## 2018-04-25 MED ORDER — ALBUTEROL SULFATE (2.5 MG/3ML) 0.083% IN NEBU
2.50 | INHALATION_SOLUTION | RESPIRATORY_TRACT | Status: DC
Start: ? — End: 2018-04-25

## 2018-04-25 MED ORDER — ACETAMINOPHEN 650 MG RE SUPP
650.00 | RECTAL | Status: DC
Start: ? — End: 2018-04-25

## 2018-04-25 MED ORDER — MAGNESIUM OXIDE 400 MG PO TABS
400.00 | ORAL_TABLET | ORAL | Status: DC
Start: 2018-04-24 — End: 2018-04-25

## 2018-04-25 MED ORDER — FERROUS SULFATE 325 (65 FE) MG PO TABS
325.00 | ORAL_TABLET | ORAL | Status: DC
Start: 2018-04-23 — End: 2018-04-25

## 2018-04-25 MED ORDER — APIXABAN 5 MG PO TABS
5.00 | ORAL_TABLET | ORAL | Status: DC
Start: 2018-04-23 — End: 2018-04-25

## 2018-04-25 MED ORDER — GENERIC EXTERNAL MEDICATION
137.00 | Status: DC
Start: 2018-04-24 — End: 2018-04-25

## 2018-04-25 MED ORDER — GABAPENTIN 100 MG PO CAPS
100.00 | ORAL_CAPSULE | ORAL | Status: DC
Start: 2018-04-23 — End: 2018-04-25

## 2018-04-25 MED ORDER — SODIUM CHLORIDE 0.9 % IV SOLN
25.00 | INTRAVENOUS | Status: DC
Start: ? — End: 2018-04-25

## 2018-04-25 MED ORDER — SENNA-DOCUSATE SODIUM 8.6-50 MG PO TABS
1.00 | ORAL_TABLET | ORAL | Status: DC
Start: 2018-04-23 — End: 2018-04-25

## 2018-04-25 MED ORDER — BUSPIRONE HCL 5 MG PO TABS
10.00 | ORAL_TABLET | ORAL | Status: DC
Start: 2018-04-23 — End: 2018-04-25

## 2018-04-29 DIAGNOSIS — I5043 Acute on chronic combined systolic (congestive) and diastolic (congestive) heart failure: Secondary | ICD-10-CM | POA: Diagnosis not present

## 2018-04-29 DIAGNOSIS — I481 Persistent atrial fibrillation: Secondary | ICD-10-CM | POA: Diagnosis not present

## 2018-04-29 DIAGNOSIS — K5900 Constipation, unspecified: Secondary | ICD-10-CM | POA: Diagnosis not present

## 2018-04-29 DIAGNOSIS — J309 Allergic rhinitis, unspecified: Secondary | ICD-10-CM | POA: Diagnosis not present

## 2018-04-29 DIAGNOSIS — K219 Gastro-esophageal reflux disease without esophagitis: Secondary | ICD-10-CM | POA: Diagnosis not present

## 2018-04-29 DIAGNOSIS — G609 Hereditary and idiopathic neuropathy, unspecified: Secondary | ICD-10-CM | POA: Diagnosis not present

## 2018-04-29 DIAGNOSIS — I2510 Atherosclerotic heart disease of native coronary artery without angina pectoris: Secondary | ICD-10-CM | POA: Diagnosis not present

## 2018-04-29 DIAGNOSIS — J449 Chronic obstructive pulmonary disease, unspecified: Secondary | ICD-10-CM | POA: Diagnosis not present

## 2018-04-29 DIAGNOSIS — M6281 Muscle weakness (generalized): Secondary | ICD-10-CM | POA: Diagnosis not present

## 2018-04-29 DIAGNOSIS — R110 Nausea: Secondary | ICD-10-CM | POA: Diagnosis not present

## 2018-04-29 DIAGNOSIS — G8929 Other chronic pain: Secondary | ICD-10-CM | POA: Diagnosis not present

## 2018-04-29 DIAGNOSIS — E039 Hypothyroidism, unspecified: Secondary | ICD-10-CM | POA: Diagnosis not present

## 2018-04-30 DIAGNOSIS — M6281 Muscle weakness (generalized): Secondary | ICD-10-CM | POA: Diagnosis not present

## 2018-04-30 DIAGNOSIS — F5105 Insomnia due to other mental disorder: Secondary | ICD-10-CM | POA: Diagnosis not present

## 2018-04-30 DIAGNOSIS — F411 Generalized anxiety disorder: Secondary | ICD-10-CM | POA: Diagnosis not present

## 2018-04-30 DIAGNOSIS — F331 Major depressive disorder, recurrent, moderate: Secondary | ICD-10-CM | POA: Diagnosis not present

## 2018-05-03 DIAGNOSIS — J309 Allergic rhinitis, unspecified: Secondary | ICD-10-CM | POA: Diagnosis not present

## 2018-05-03 DIAGNOSIS — E039 Hypothyroidism, unspecified: Secondary | ICD-10-CM | POA: Diagnosis not present

## 2018-05-03 DIAGNOSIS — J449 Chronic obstructive pulmonary disease, unspecified: Secondary | ICD-10-CM | POA: Diagnosis not present

## 2018-05-03 DIAGNOSIS — I481 Persistent atrial fibrillation: Secondary | ICD-10-CM | POA: Diagnosis not present

## 2018-05-03 DIAGNOSIS — I5043 Acute on chronic combined systolic (congestive) and diastolic (congestive) heart failure: Secondary | ICD-10-CM | POA: Diagnosis not present

## 2018-05-03 DIAGNOSIS — M6281 Muscle weakness (generalized): Secondary | ICD-10-CM | POA: Diagnosis not present

## 2018-05-03 DIAGNOSIS — G609 Hereditary and idiopathic neuropathy, unspecified: Secondary | ICD-10-CM | POA: Diagnosis not present

## 2018-05-03 DIAGNOSIS — K5900 Constipation, unspecified: Secondary | ICD-10-CM | POA: Diagnosis not present

## 2018-05-03 DIAGNOSIS — K219 Gastro-esophageal reflux disease without esophagitis: Secondary | ICD-10-CM | POA: Diagnosis not present

## 2018-05-03 DIAGNOSIS — G8929 Other chronic pain: Secondary | ICD-10-CM | POA: Diagnosis not present

## 2018-05-03 DIAGNOSIS — R110 Nausea: Secondary | ICD-10-CM | POA: Diagnosis not present

## 2018-05-03 DIAGNOSIS — I2510 Atherosclerotic heart disease of native coronary artery without angina pectoris: Secondary | ICD-10-CM | POA: Diagnosis not present

## 2018-05-08 DIAGNOSIS — S81801A Unspecified open wound, right lower leg, initial encounter: Secondary | ICD-10-CM | POA: Diagnosis not present

## 2018-06-06 DEATH — deceased
